# Patient Record
Sex: Male | Born: 1960 | Race: White | Hispanic: No | Marital: Married | Smoking: Former smoker
Health system: Southern US, Community
[De-identification: ages and names within clinical notes are randomized; demographics above are authoritative.]

## PROBLEM LIST (undated history)

## (undated) DIAGNOSIS — E291 Testicular hypofunction: Secondary | ICD-10-CM

## (undated) DIAGNOSIS — M797 Fibromyalgia: Secondary | ICD-10-CM

## (undated) DIAGNOSIS — M75102 Unspecified rotator cuff tear or rupture of left shoulder, not specified as traumatic: Secondary | ICD-10-CM

## (undated) DIAGNOSIS — Z9889 Other specified postprocedural states: Secondary | ICD-10-CM

## (undated) DIAGNOSIS — G4730 Sleep apnea, unspecified: Secondary | ICD-10-CM

## (undated) DIAGNOSIS — M2669 Other specified disorders of temporomandibular joint: Secondary | ICD-10-CM

## (undated) DIAGNOSIS — R112 Nausea with vomiting, unspecified: Secondary | ICD-10-CM

## (undated) DIAGNOSIS — E559 Vitamin D deficiency, unspecified: Secondary | ICD-10-CM

## (undated) DIAGNOSIS — G894 Chronic pain syndrome: Secondary | ICD-10-CM

## (undated) DIAGNOSIS — R519 Headache, unspecified: Secondary | ICD-10-CM

## (undated) DIAGNOSIS — I739 Peripheral vascular disease, unspecified: Secondary | ICD-10-CM

## (undated) DIAGNOSIS — F419 Anxiety disorder, unspecified: Secondary | ICD-10-CM

## (undated) DIAGNOSIS — F329 Major depressive disorder, single episode, unspecified: Secondary | ICD-10-CM

## (undated) DIAGNOSIS — M1990 Unspecified osteoarthritis, unspecified site: Secondary | ICD-10-CM

## (undated) DIAGNOSIS — B001 Herpesviral vesicular dermatitis: Secondary | ICD-10-CM

## (undated) DIAGNOSIS — K219 Gastro-esophageal reflux disease without esophagitis: Secondary | ICD-10-CM

## (undated) DIAGNOSIS — F32A Depression, unspecified: Secondary | ICD-10-CM

## (undated) DIAGNOSIS — M26629 Arthralgia of temporomandibular joint, unspecified side: Secondary | ICD-10-CM

## (undated) HISTORY — DX: Other specified disorders of temporomandibular joint: M26.69

## (undated) HISTORY — DX: Anxiety disorder, unspecified: F41.9

## (undated) HISTORY — DX: Major depressive disorder, single episode, unspecified: F32.9

## (undated) HISTORY — PX: COLONOSCOPY: SHX174

## (undated) HISTORY — DX: Fibromyalgia: M79.7

## (undated) HISTORY — DX: Depression, unspecified: F32.A

## (undated) HISTORY — DX: Chronic pain syndrome: G89.4

## (undated) HISTORY — DX: Testicular hypofunction: E29.1

## (undated) HISTORY — DX: Vitamin D deficiency, unspecified: E55.9

## (undated) HISTORY — PX: ROTATOR CUFF REPAIR: SHX139

## (undated) HISTORY — PX: VASECTOMY: SHX75

## (undated) HISTORY — DX: Herpesviral vesicular dermatitis: B00.1

---

## 1994-01-14 HISTORY — PX: SHOULDER ARTHROSCOPY: SHX128

## 1994-01-14 HISTORY — PX: ELBOW ARTHROPLASTY: SHX928

## 1995-01-15 HISTORY — PX: ORIF WRIST FRACTURE: SHX2133

## 1995-01-15 HISTORY — PX: HARDWARE REMOVAL: SHX979

## 2000-10-24 ENCOUNTER — Ambulatory Visit (HOSPITAL_COMMUNITY): Admission: RE | Admit: 2000-10-24 | Discharge: 2000-10-24 | Payer: Self-pay | Admitting: Otolaryngology

## 2000-10-24 ENCOUNTER — Encounter: Payer: Self-pay | Admitting: Otolaryngology

## 2010-10-03 DIAGNOSIS — M2550 Pain in unspecified joint: Secondary | ICD-10-CM | POA: Insufficient documentation

## 2010-11-08 ENCOUNTER — Ambulatory Visit: Payer: 59 | Attending: Psychiatry

## 2010-11-08 DIAGNOSIS — IMO0001 Reserved for inherently not codable concepts without codable children: Secondary | ICD-10-CM | POA: Insufficient documentation

## 2010-11-08 DIAGNOSIS — G8929 Other chronic pain: Secondary | ICD-10-CM | POA: Insufficient documentation

## 2012-03-05 ENCOUNTER — Other Ambulatory Visit: Payer: Self-pay | Admitting: Physical Medicine and Rehabilitation

## 2012-03-05 ENCOUNTER — Ambulatory Visit
Admission: RE | Admit: 2012-03-05 | Discharge: 2012-03-05 | Disposition: A | Discharge status: Home / Self Care | Payer: 59 | Source: Ambulatory Visit | Attending: Physical Medicine and Rehabilitation | Admitting: Physical Medicine and Rehabilitation

## 2012-03-05 DIAGNOSIS — M1990 Unspecified osteoarthritis, unspecified site: Secondary | ICD-10-CM

## 2012-03-05 DIAGNOSIS — M545 Low back pain, unspecified: Secondary | ICD-10-CM

## 2012-08-10 DIAGNOSIS — M1990 Unspecified osteoarthritis, unspecified site: Secondary | ICD-10-CM | POA: Insufficient documentation

## 2012-10-02 ENCOUNTER — Encounter (HOSPITAL_BASED_OUTPATIENT_CLINIC_OR_DEPARTMENT_OTHER): Payer: Self-pay | Admitting: *Deleted

## 2012-10-02 NOTE — Progress Notes (Signed)
No labs needed-had a sleep study-said no dr called him to use a cpap-will bring copy-told to bring all meds and overnight bag in case of need to stay

## 2012-10-07 ENCOUNTER — Encounter (HOSPITAL_BASED_OUTPATIENT_CLINIC_OR_DEPARTMENT_OTHER): Payer: Self-pay | Admitting: *Deleted

## 2012-10-09 ENCOUNTER — Encounter (HOSPITAL_BASED_OUTPATIENT_CLINIC_OR_DEPARTMENT_OTHER): Payer: Self-pay | Admitting: Anesthesiology

## 2012-10-09 ENCOUNTER — Ambulatory Visit (HOSPITAL_BASED_OUTPATIENT_CLINIC_OR_DEPARTMENT_OTHER)
Admission: RE | Admit: 2012-10-09 | Discharge: 2012-10-09 | Disposition: A | Discharge status: Home / Self Care | Payer: 59 | Source: Ambulatory Visit | Attending: Orthopedic Surgery | Admitting: Orthopedic Surgery

## 2012-10-09 ENCOUNTER — Ambulatory Visit (HOSPITAL_BASED_OUTPATIENT_CLINIC_OR_DEPARTMENT_OTHER): Payer: 59 | Admitting: Anesthesiology

## 2012-10-09 ENCOUNTER — Encounter (HOSPITAL_BASED_OUTPATIENT_CLINIC_OR_DEPARTMENT_OTHER)
Admission: RE | Disposition: A | Discharge status: Home / Self Care | Payer: Self-pay | Source: Ambulatory Visit | Attending: Orthopedic Surgery

## 2012-10-09 DIAGNOSIS — M75102 Unspecified rotator cuff tear or rupture of left shoulder, not specified as traumatic: Secondary | ICD-10-CM | POA: Diagnosis present

## 2012-10-09 DIAGNOSIS — K219 Gastro-esophageal reflux disease without esophagitis: Secondary | ICD-10-CM | POA: Insufficient documentation

## 2012-10-09 DIAGNOSIS — M719 Bursopathy, unspecified: Secondary | ICD-10-CM | POA: Insufficient documentation

## 2012-10-09 DIAGNOSIS — M19019 Primary osteoarthritis, unspecified shoulder: Secondary | ICD-10-CM | POA: Insufficient documentation

## 2012-10-09 DIAGNOSIS — M67919 Unspecified disorder of synovium and tendon, unspecified shoulder: Secondary | ICD-10-CM | POA: Insufficient documentation

## 2012-10-09 DIAGNOSIS — M25819 Other specified joint disorders, unspecified shoulder: Secondary | ICD-10-CM | POA: Insufficient documentation

## 2012-10-09 HISTORY — DX: Nausea with vomiting, unspecified: R11.2

## 2012-10-09 HISTORY — DX: Other specified postprocedural states: Z98.890

## 2012-10-09 HISTORY — DX: Unspecified rotator cuff tear or rupture of left shoulder, not specified as traumatic: M75.102

## 2012-10-09 HISTORY — DX: Gastro-esophageal reflux disease without esophagitis: K21.9

## 2012-10-09 HISTORY — PX: SHOULDER ARTHROSCOPY WITH ROTATOR CUFF REPAIR AND SUBACROMIAL DECOMPRESSION: SHX5686

## 2012-10-09 HISTORY — DX: Arthralgia of temporomandibular joint, unspecified side: M26.629

## 2012-10-09 HISTORY — DX: Sleep apnea, unspecified: G47.30

## 2012-10-09 HISTORY — DX: Unspecified osteoarthritis, unspecified site: M19.90

## 2012-10-09 SURGERY — SHOULDER ARTHROSCOPY WITH ROTATOR CUFF REPAIR AND SUBACROMIAL DECOMPRESSION
Anesthesia: General | Site: Shoulder | Laterality: Left | Wound class: Clean

## 2012-10-09 MED ORDER — LACTATED RINGERS IV SOLN
INTRAVENOUS | Status: DC
  Administered 2012-10-09: 20 mL/h via INTRAVENOUS
  Administered 2012-10-09 (×2): via INTRAVENOUS

## 2012-10-09 MED ORDER — DEXAMETHASONE SODIUM PHOSPHATE 4 MG/ML IJ SOLN
INTRAMUSCULAR | Status: DC | PRN
  Administered 2012-10-09: 10 mg via INTRAVENOUS

## 2012-10-09 MED ORDER — SUCCINYLCHOLINE CHLORIDE 20 MG/ML IJ SOLN
INTRAMUSCULAR | Status: DC | PRN
  Administered 2012-10-09: 120 mg via INTRAVENOUS

## 2012-10-09 MED ORDER — ONDANSETRON HCL 4 MG/2ML IJ SOLN: 4.0000 mg | Freq: Once | INTRAMUSCULAR | Status: DC | PRN

## 2012-10-09 MED ORDER — METHOCARBAMOL 500 MG PO TABS: 500.0000 mg | ORAL_TABLET | Freq: Four times a day (QID) | ORAL | Status: DC

## 2012-10-09 MED ORDER — METOCLOPRAMIDE HCL 5 MG/ML IJ SOLN
10.0000 mg | Freq: Once | INTRAMUSCULAR | Status: AC
  Administered 2012-10-09: 10 mg via INTRAVENOUS

## 2012-10-09 MED ORDER — PROMETHAZINE HCL 25 MG PO TABS: 25.0000 mg | ORAL_TABLET | Freq: Four times a day (QID) | ORAL | Status: DC | PRN

## 2012-10-09 MED ORDER — PROMETHAZINE HCL 25 MG/ML IJ SOLN
6.2500 mg | Freq: Once | INTRAMUSCULAR | Status: AC | PRN
  Administered 2012-10-09: 6.25 mg via INTRAVENOUS

## 2012-10-09 MED ORDER — OXYCODONE-ACETAMINOPHEN 10-325 MG PO TABS
1.0000 | ORAL_TABLET | Freq: Four times a day (QID) | ORAL | Status: DC | PRN
Start: 2012-10-09 — End: 2013-05-05

## 2012-10-09 MED ORDER — PROPOFOL 10 MG/ML IV BOLUS
INTRAVENOUS | Status: DC | PRN
  Administered 2012-10-09: 170 mg via INTRAVENOUS

## 2012-10-09 MED ORDER — SODIUM CHLORIDE 0.9 % IR SOLN
Status: DC | PRN
  Administered 2012-10-09: 12000 mL

## 2012-10-09 MED ORDER — KETOROLAC TROMETHAMINE 30 MG/ML IJ SOLN: 15.0000 mg | Freq: Once | INTRAMUSCULAR | Status: DC | PRN

## 2012-10-09 MED ORDER — HYDROMORPHONE HCL PF 1 MG/ML IJ SOLN: 0.2500 mg | INTRAMUSCULAR | Status: DC | PRN

## 2012-10-09 MED ORDER — GLYCOPYRROLATE 0.2 MG/ML IJ SOLN
INTRAMUSCULAR | Status: DC | PRN
  Administered 2012-10-09: 0.2 mg via INTRAVENOUS

## 2012-10-09 MED ORDER — FENTANYL CITRATE 0.05 MG/ML IJ SOLN
50.0000 ug | INTRAMUSCULAR | Status: DC | PRN
  Administered 2012-10-09: 100 ug via INTRAVENOUS

## 2012-10-09 MED ORDER — FENTANYL CITRATE 0.05 MG/ML IJ SOLN
INTRAMUSCULAR | Status: DC | PRN
  Administered 2012-10-09: 100 ug via INTRAVENOUS

## 2012-10-09 MED ORDER — OXYCODONE HCL 5 MG/5ML PO SOLN: 5.0000 mg | Freq: Once | ORAL | Status: DC | PRN

## 2012-10-09 MED ORDER — OXYCODONE HCL 5 MG PO TABS: 5.0000 mg | ORAL_TABLET | Freq: Once | ORAL | Status: DC | PRN

## 2012-10-09 MED ORDER — SCOPOLAMINE 1 MG/3DAYS TD PT72
1.0000 | MEDICATED_PATCH | TRANSDERMAL | Status: DC
  Administered 2012-10-09: 1.5 mg via TRANSDERMAL

## 2012-10-09 MED ORDER — MIDAZOLAM HCL 2 MG/2ML IJ SOLN
1.0000 mg | INTRAMUSCULAR | Status: DC | PRN
  Administered 2012-10-09: 2 mg via INTRAVENOUS

## 2012-10-09 MED ORDER — CEFAZOLIN SODIUM-DEXTROSE 2-3 GM-% IV SOLR
2.0000 g | Freq: Once | INTRAVENOUS | Status: AC
  Administered 2012-10-09: 2 g via INTRAVENOUS

## 2012-10-09 MED ORDER — ONDANSETRON HCL 4 MG/2ML IJ SOLN
INTRAMUSCULAR | Status: DC | PRN
  Administered 2012-10-09: 4 mg via INTRAVENOUS

## 2012-10-09 SURGICAL SUPPLY — 68 items
ANCH SUT SWLK 19.1 CLS EYLT TL (Anchor) ×1 IMPLANT
ANCHOR SUT BIO SW 4.75 W/FIB (Anchor) ×1 IMPLANT
APL SKNCLS STERI-STRIP NONHPOA (GAUZE/BANDAGES/DRESSINGS) ×1
BENZOIN TINCTURE PRP APPL 2/3 (GAUZE/BANDAGES/DRESSINGS) ×2 IMPLANT
BLADE CUTTER GATOR 3.5 (BLADE) ×2 IMPLANT
BLADE GREAT WHITE 4.2 (BLADE) IMPLANT
BLADE SURG 15 STRL LF DISP TIS (BLADE) IMPLANT
BLADE SURG 15 STRL SS (BLADE)
BUR OVAL 4.0 (BURR) IMPLANT
BUR OVAL 6.0 (BURR) ×1 IMPLANT
CANISTER OMNI JUG 16 LITER (MISCELLANEOUS) ×2 IMPLANT
CANNULA 5.75X71 LONG (CANNULA) ×2 IMPLANT
CANNULA TWIST IN 8.25X7CM (CANNULA) ×2 IMPLANT
CLOTH BEACON ORANGE TIMEOUT ST (SAFETY) ×2 IMPLANT
DECANTER SPIKE VIAL GLASS SM (MISCELLANEOUS) IMPLANT
DRAPE INCISE IOBAN 66X45 STRL (DRAPES) ×2 IMPLANT
DRAPE SHOULDER BEACH CHAIR (DRAPES) ×2 IMPLANT
DRAPE U 20/CS (DRAPES) ×2 IMPLANT
DRAPE U-SHAPE 47X51 STRL (DRAPES) ×2 IMPLANT
DRSG PAD ABDOMINAL 8X10 ST (GAUZE/BANDAGES/DRESSINGS) ×2 IMPLANT
DURAPREP 26ML APPLICATOR (WOUND CARE) ×2 IMPLANT
ELECT REM PT RETURN 9FT ADLT (ELECTROSURGICAL) ×2
ELECTRODE REM PT RTRN 9FT ADLT (ELECTROSURGICAL) ×1 IMPLANT
FIBERSTICK 2 (SUTURE) IMPLANT
GLOVE BIO SURGEON STRL SZ 6.5 (GLOVE) ×1 IMPLANT
GLOVE BIO SURGEON STRL SZ8 (GLOVE) ×2 IMPLANT
GLOVE BIOGEL PI IND STRL 7.0 (GLOVE) IMPLANT
GLOVE BIOGEL PI IND STRL 8 (GLOVE) ×2 IMPLANT
GLOVE BIOGEL PI INDICATOR 7.0 (GLOVE) ×1
GLOVE BIOGEL PI INDICATOR 8 (GLOVE) ×2
GLOVE ORTHO TXT STRL SZ7.5 (GLOVE) ×2 IMPLANT
GOWN BRE IMP PREV XXLGXLNG (GOWN DISPOSABLE) ×4 IMPLANT
GOWN PREVENTION PLUS XLARGE (GOWN DISPOSABLE) ×2 IMPLANT
IMMOBILIZER SHOULDER XLGE (ORTHOPEDIC SUPPLIES) IMPLANT
KIT SHOULDER TRACTION (DRAPES) ×2 IMPLANT
LASSO SUT 90 DEGREE (SUTURE) IMPLANT
NDL SCORPION MULTI FIRE (NEEDLE) IMPLANT
NEEDLE SCORPION MULTI FIRE (NEEDLE) ×2 IMPLANT
PACK ARTHROSCOPY DSU (CUSTOM PROCEDURE TRAY) ×2 IMPLANT
PACK BASIN DAY SURGERY FS (CUSTOM PROCEDURE TRAY) ×2 IMPLANT
SET ARTHROSCOPY TUBING (MISCELLANEOUS) ×2
SET ARTHROSCOPY TUBING LN (MISCELLANEOUS) ×1 IMPLANT
SHEET MEDIUM DRAPE 40X70 STRL (DRAPES) ×2 IMPLANT
SLEEVE SCD COMPRESS KNEE MED (MISCELLANEOUS) ×2 IMPLANT
SLING ARM FOAM STRAP LRG (SOFTGOODS) IMPLANT
SLING ARM FOAM STRAP MED (SOFTGOODS) IMPLANT
SLING ARM FOAM STRAP XLG (SOFTGOODS) IMPLANT
SLING ARM IMMOBILIZER LRG (SOFTGOODS) IMPLANT
SLING ARM IMMOBILIZER MED (SOFTGOODS) IMPLANT
SPONGE GAUZE 4X4 12PLY (GAUZE/BANDAGES/DRESSINGS) ×2 IMPLANT
STRIP CLOSURE SKIN 1/2X4 (GAUZE/BANDAGES/DRESSINGS) ×2 IMPLANT
SUT FIBERWIRE #2 38 T-5 BLUE (SUTURE)
SUT LASSO 45 DEGREE (SUTURE) IMPLANT
SUT LASSO 45 DEGREE LEFT (SUTURE) IMPLANT
SUT LASSO 45D RIGHT (SUTURE) IMPLANT
SUT MNCRL AB 4-0 PS2 18 (SUTURE) ×1 IMPLANT
SUT PDS AB 1 CT  36 (SUTURE)
SUT PDS AB 1 CT 36 (SUTURE) IMPLANT
SUT TIGER TAPE 7 IN WHITE (SUTURE) IMPLANT
SUT VIC AB 3-0 SH 27 (SUTURE)
SUT VIC AB 3-0 SH 27X BRD (SUTURE) IMPLANT
SUTURE FIBERWR #2 38 T-5 BLUE (SUTURE) IMPLANT
TAPE FIBER 2MM 7IN #2 BLUE (SUTURE) ×2 IMPLANT
TOWEL OR 17X24 6PK STRL BLUE (TOWEL DISPOSABLE) ×2 IMPLANT
TOWEL OR NON WOVEN STRL DISP B (DISPOSABLE) ×2 IMPLANT
TUBE CONNECTING 20X1/4 (TUBING) IMPLANT
WAND STAR VAC 90 (SURGICAL WAND) ×2 IMPLANT
WATER STERILE IRR 1000ML POUR (IV SOLUTION) ×2 IMPLANT

## 2012-10-09 NOTE — Progress Notes (Signed)
Assisted Dr. Joslin with left, ultrasound guided, interscalene  block. Side rails up, monitors on throughout procedure. See vital signs in flow sheet. Tolerated Procedure well. 

## 2012-10-09 NOTE — Anesthesia Procedure Notes (Addendum)
Anesthesia Regional Block:  Interscalene brachial plexus block  Pre-Anesthetic Checklist: ,, timeout performed, Correct Patient, Correct Site, Correct Laterality, Correct Procedure, Correct Position, site marked, Risks and benefits discussed,  Surgical consent,  Pre-op evaluation,  At surgeon's request and post-op pain management  Laterality: Left  Prep: chloraprep       Needles:  Injection technique: Single-shot  Needle Type: Echogenic Stimulator Needle      Needle Gauge: 22 and 22 G    Additional Needles:  Procedures: ultrasound guided (picture in chart) Interscalene brachial plexus block Narrative:  Start time: 10/09/2012 9:15 AM End time: 10/09/2012 9:20 AM Injection made incrementally with aspirations every 5 mL.  Performed by: Personally   Additional Notes: 20 cc 0.5% Bupivacaine with 1:200 Epi injected easily   Procedure Name: Intubation Date/Time: 10/09/2012 9:30 AM Performed by: Gar Gibbon Pre-anesthesia Checklist: Patient identified, Emergency Drugs available, Suction available and Patient being monitored Patient Re-evaluated:Patient Re-evaluated prior to inductionOxygen Delivery Method: Circle System Utilized Preoxygenation: Pre-oxygenation with 100% oxygen Intubation Type: IV induction Ventilation: Mask ventilation without difficulty Laryngoscope Size: Miller Grade View: Grade III Tube type: Oral Number of attempts: 1 Airway Equipment and Method: oral airway,  Patient positioned with wedge pillow,  Rigid stylet,  Video-laryngoscopy and stylet Placement Confirmation: ETT inserted through vocal cords under direct vision,  positive ETCO2 and breath sounds checked- equal and bilateral Secured at: 22 cm Tube secured with: Tape Dental Injury: Bloody posterior oropharynx  Difficulty Due To: Difficulty was anticipated and Difficult Airway- due to anterior larynx Future Recommendations: Recommend- induction with short-acting agent, and alternative techniques  readily available Comments: Attempt x 1 with miller #3, grade 3 view.  Glidescope with rigid stylet. EBBS checked per Dr Noreene Larsson pre and post positioning in lateral position

## 2012-10-09 NOTE — Transfer of Care (Signed)
Immediate Anesthesia Transfer of Care Note  Patient: Benjamin Warren  Procedure(s) Performed: Procedure(s): LEFT SHOULDER ARTHROSCOPY WITH SUBACROMIAL DECOMPRESSION, DISTAL CLAVICLE EXCISION DEBRIDEMENT AND ROTATOR CUFF REPAIR (Left)  Patient Location: PACU  Anesthesia Type:GA combined with regional for post-op pain  Level of Consciousness: awake, sedated and patient cooperative  Airway & Oxygen Therapy: Patient Spontanous Breathing and Patient connected to face mask oxygen  Post-op Assessment: Report given to PACU RN and Post -op Vital signs reviewed and stable  Post vital signs: Reviewed and stable  Complications: No apparent anesthesia complications

## 2012-10-09 NOTE — H&P (Signed)
PREOPERATIVE H&P  Chief Complaint: LEFT SHOULDER pain  HPI: Benjamin Warren is a 52 y.o. male who presents for preoperative history and physical with a diagnosis of LEFT SHOULDER IMPINGEMENT SYNDROME, possible RUPTURE F ROTATOR CUFF. Symptoms are rated as moderate to severe, and have been worsening.  This is significantly impairing activities of daily living.  He has elected for surgical management. He has failed injections, exercises, and complains of pain that is laterally, with difficulty lifting and sleeping. He also has a diagnosis of chronic pain syndrome and fibromyalgia.  Past Medical History  Diagnosis Date  . Arthritis   . GERD (gastroesophageal reflux disease)   . Wears glasses   . PONV (postoperative nausea and vomiting)   . TMJ syndrome   . Sleep apnea     had test-no dr told him he needed cpap   Past Surgical History  Procedure Laterality Date  . Orif wrist fracture  1997    right  . Hardware removal  1997    rt wrist  . Shoulder arthroscopy  1996    right  . Elbow arthroplasty  1996    right   History   Social History  . Marital Status: Married    Spouse Name: N/A    Number of Children: N/A  . Years of Education: N/A   Social History Main Topics  . Smoking status: Former Smoker    Quit date: 10/03/1982  . Smokeless tobacco: None  . Alcohol Use: Yes     Comment: occ  . Drug Use: None  . Sexual Activity: None   Other Topics Concern  . None   Social History Narrative  . None   History reviewed. No pertinent family history. No Known Allergies Prior to Admission medications   Medication Sig Start Date End Date Taking? Authorizing Provider  carisoprodol (SOMA) 350 MG tablet Take 350 mg by mouth at bedtime as needed for muscle spasms.   Yes Historical Provider, MD  diazepam (VALIUM) 10 MG tablet Take 10 mg by mouth every 8 (eight) hours as needed for anxiety.   Yes Historical Provider, MD  divalproex (DEPAKOTE) 500 MG DR tablet Take 500 mg by mouth at  bedtime.   Yes Historical Provider, MD  omeprazole (PRILOSEC) 40 MG capsule Take 40 mg by mouth daily.   Yes Historical Provider, MD     Positive ROS: All other systems have been reviewed and were otherwise negative with the exception of those mentioned in the HPI and as above.  Physical Exam: General: Alert, no acute distress Cardiovascular: No pedal edema Respiratory: No cyanosis, no use of accessory musculature GI: No organomegaly, abdomen is soft and non-tender Skin: No lesions in the area of chief complaint Neurologic: Sensation intact distally Psychiatric: Patient is competent for consent with normal mood and affect Lymphatic: No axillary or cervical lymphadenopathy  MUSCULOSKELETAL: Left shoulder active forward flexion is 0-120. External rotation is to 10. He does have some pain over the a.c. joint.  Assessment: LEFT SHOULDER IMPINGEMENT SYNDROME, possible RUPTURE F ROTATOR CUFF, a.c. joint arthrosis  Plan: Plan for Procedure(s): LEFT SHOULDER ARTHROSCOPY WITH SUBACROMIAL DECOMPRESSION, DISTAL CLAVICLE EXCISION AND ROTATOR CUFF REPAIR depending on operative findings  The risks benefits and alternatives were discussed with the patient including but not limited to the risks of nonoperative treatment, versus surgical intervention including infection, bleeding, nerve injury,  blood clots, cardiopulmonary complications, morbidity, mortality, among others, and they were willing to proceed.   Eulas Post, MD Cell [phone]  10/09/2012 8:57 AM

## 2012-10-09 NOTE — Op Note (Signed)
10/09/2012  11:13 AM  PATIENT:  Benjamin Warren    PRE-OPERATIVE DIAGNOSIS:  Left shoulder high-grade rotator cuff tear of the supraspinatus, impingement syndrome, a.c. joint arthrosis, possible labral fraying  POST-OPERATIVE DIAGNOSIS:  Left shoulder 90% supraspinatus tear, impingement syndrome, a.c. joint arthrosis, anterior-superior labral fraying  PROCEDURE:  LEFT SHOULDER ARTHROSCOPY WITH SUBACROMIAL DECOMPRESSION, DISTAL CLAVICLE EXCISION DEBRIDEMENT AND ROTATOR CUFF REPAIR, and debridement of labrum  SURGEON:  Eulas Post, MD  PHYSICIAN ASSISTANT: Janace Litten, OPA-C, present and scrubbed throughout the case, critical for completion in a timely fashion, and for retraction, instrumentation, and closure.  ANESTHESIA:   General  PREOPERATIVE INDICATIONS:  [name] is a  52 y.o. male with a diagnosis of LEFT SHOULDER IMPINGEMENT SYNDROME, COMPLETE RUPTURE OF ROTATOR CUFF who failed conservative measures and elected for surgical management.    The risks benefits and alternatives were discussed with the patient preoperatively including but not limited to the risks of infection, bleeding, nerve injury, cardiopulmonary complications, the need for revision surgery, among others, and the patient was willing to proceed.  OPERATIVE IMPLANTS: Arthrex bio composite 4.75 mm swivel lock anchor with a single inverted fiber tape suture for the rotator cuff  OPERATIVE FINDINGS: The glenohumeral articular surface was normal. The anterior superior labrum was frayed. The subscapularis and middle glenohumeral ligament was normal. The biceps tendon had a little bit of fraying, but this was less than 10%. The posterior labrum was intact. The shoulder had full motion during examination under anesthesia. The rotator cuff cable was actually intact, and the capsular portion of the supraspinatus was intact, however from the bursal side there was complete disruption of the tendon. It was rather remarkable,  such that it did appear that the capsule of the shoulder was intact while the superior tendon was completely disrupted.  The tendon quality was fair. There was a sizable subacromial spur with CA ligament fraying and also hypertrophy of the distal clavicle that was fairly substantial.  OPERATIVE PROCEDURE: The patient is brought to the operating room and placed in the supine position. General anesthesia was administered. IV antibiotics were given. The left upper [place] was examined and found to have full motion. It was then prepped and draped in usual sterile fashion. Time out was performed. Diagnostic arthroscopy was carried out the above-named findings. I used the arthroscopic shaver to debride the anterior superior labrum.  I then went to the subacromial space, and found substantial rotator cuff pathology. The bone tendon edges were debrided, and the CA ligament was released. There was a sizable subacromial spur which I removed with a bur. I performed a light tubercleplasty, preparing a bed for reimplantation of the supraspinatus tendon. I also removed the remaining articular surface of the cuff tissue, completing the tear to be full-thickness, and then used in inverted fiber tape placed with a scorpion suture passer. My first pass actually went around the biceps tendon, and I had to remove this, and then replaced it making sure that the tape was passed over-the-top of the tendon, and not encircling it.  After both tails had been passed, brought these through a superior cannula, and then placed a punch into the lateral margin of the tuberosity, and repaired the tendon back to bone. Excellent fixation and tension was achieved.  I then removed the hypertrophic capsular tissue around the a.c. joint, and there was excellent fairly substantial degenerative change noted, along with what appeared to be a small area of discoid meniscus left. This was removed, and  I performed a distal clavicle resection  confirming satisfactory resection for multiple views. Visualization of the distal clavicle was moderately challenging, and he was fairly medial and configuration.  Nonetheless I did achieve satisfactory resection, removing probably at least 12 millimeters of bone.  The instruments were removed, and the portals closed with Monocryl followed by Steri-Strips and sterile gauze. He was awakened and returned to the PACU in stable and satisfactory condition. There were no complications.

## 2012-10-09 NOTE — Anesthesia Preprocedure Evaluation (Addendum)
Anesthesia Evaluation  Patient identified by MRN, date of birth, ID band Patient awake    Reviewed: Allergy & Precautions, H&P , NPO status , Patient's Chart, lab work & pertinent test results  History of Anesthesia Complications (+) PONV  Airway Mallampati: II TM Distance: >3 FB Neck ROM: Full    Dental  (+) Teeth Intact and Dental Advisory Given   Pulmonary former smoker (quit '84),  breath sounds clear to auscultation        Cardiovascular Rhythm:Regular Rate:Normal     Neuro/Psych    GI/Hepatic GERD-  Medicated and Controlled,  Endo/Other    Renal/GU      Musculoskeletal   Abdominal   Peds  Hematology   Anesthesia Other Findings   Reproductive/Obstetrics                          Anesthesia Physical Anesthesia Plan  ASA: II  Anesthesia Plan: General   Post-op Pain Management:    Induction: Intravenous  Airway Management Planned: Oral ETT  Additional Equipment:   Intra-op Plan:   Post-operative Plan: Extubation in OR  Informed Consent: I have reviewed the patients History and Physical, chart, labs and discussed the procedure including the risks, benefits and alternatives for the proposed anesthesia with the patient or authorized representative who has indicated his/her understanding and acceptance.   Dental advisory given  Plan Discussed with:   Anesthesia Plan Comments: (Impingement L. Shoulder H/O PONV Sleep apnea RDI 23 not on CPAP GERD  Plan GA with interscalene block and oral ETT  Kipp Brood, MD)        Anesthesia Quick Evaluation

## 2012-10-09 NOTE — Anesthesia Postprocedure Evaluation (Signed)
  Anesthesia Post-op Note  Patient: Benjamin Warren  Procedure(s) Performed: Procedure(s): LEFT SHOULDER ARTHROSCOPY WITH SUBACROMIAL DECOMPRESSION, DISTAL CLAVICLE EXCISION DEBRIDEMENT AND ROTATOR CUFF REPAIR (Left)  Patient Location: PACU  Anesthesia Type:GA combined with regional for post-op pain  Level of Consciousness: awake, alert , oriented and patient cooperative  Airway and Oxygen Therapy: Patient Spontanous Breathing  Post-op Pain: none  Post-op Assessment: Post-op Vital signs reviewed, Patient's Cardiovascular Status Stable, Respiratory Function Stable, Patent Airway and Pain level controlled, nausea much improved  Post-op Vital Signs: Reviewed and stable  Complications: No apparent anesthesia complications

## 2012-10-13 ENCOUNTER — Encounter (HOSPITAL_BASED_OUTPATIENT_CLINIC_OR_DEPARTMENT_OTHER): Payer: Self-pay | Admitting: Orthopedic Surgery

## 2012-11-24 ENCOUNTER — Emergency Department (HOSPITAL_BASED_OUTPATIENT_CLINIC_OR_DEPARTMENT_OTHER)
Admission: EM | Admit: 2012-11-24 | Discharge: 2012-11-25 | Disposition: A | Discharge status: Home / Self Care | Payer: 59 | Attending: Emergency Medicine | Admitting: Emergency Medicine

## 2012-11-24 ENCOUNTER — Encounter (HOSPITAL_BASED_OUTPATIENT_CLINIC_OR_DEPARTMENT_OTHER): Payer: Self-pay | Admitting: Emergency Medicine

## 2012-11-24 DIAGNOSIS — Z79899 Other long term (current) drug therapy: Secondary | ICD-10-CM | POA: Insufficient documentation

## 2012-11-24 DIAGNOSIS — M129 Arthropathy, unspecified: Secondary | ICD-10-CM | POA: Insufficient documentation

## 2012-11-24 DIAGNOSIS — K219 Gastro-esophageal reflux disease without esophagitis: Secondary | ICD-10-CM | POA: Insufficient documentation

## 2012-11-24 DIAGNOSIS — M545 Low back pain, unspecified: Secondary | ICD-10-CM

## 2012-11-24 DIAGNOSIS — Z87891 Personal history of nicotine dependence: Secondary | ICD-10-CM | POA: Insufficient documentation

## 2012-11-24 LAB — URINALYSIS, ROUTINE W REFLEX MICROSCOPIC
Bilirubin Urine: NEGATIVE
Glucose, UA: NEGATIVE mg/dL
Hgb urine dipstick: NEGATIVE
Ketones, ur: NEGATIVE mg/dL
Protein, ur: NEGATIVE mg/dL
Specific Gravity, Urine: 1.023 (ref 1.005–1.030)

## 2012-11-24 NOTE — ED Notes (Addendum)
Pt reports lower back pain along spine that radiates out beyond bilateral flanks.  Pt. Reports that he went to orthopedic today and had an xray performed on his back.  Sts that the MD wanted to have MRI performed on back this Saturday. Reports taking 3 valium earlier with very minimal relief.  Denies dysuria or any urinary symptoms. Reports deep breaths make the pain worse.

## 2012-11-24 NOTE — ED Notes (Signed)
Bilateral flank pain and lower back pain. Hx of same pain in the past year relieved by Flexeril. He was seen by his ortho today and had negative exam but they scheduled him for an MRI in 4 days.

## 2012-11-25 MED ORDER — KETOROLAC TROMETHAMINE 60 MG/2ML IM SOLN
60.0000 mg | Freq: Once | INTRAMUSCULAR | Status: AC
  Administered 2012-11-25: 60 mg via INTRAMUSCULAR
  Filled 2012-11-25: qty 2

## 2012-11-25 NOTE — ED Notes (Signed)
Family at bedside. 

## 2012-11-25 NOTE — ED Provider Notes (Signed)
CSN: [id number]     Arrival date & time 11/24/12  2248 History   First MD Initiated Contact with Patient 11/25/12 0050     Chief Complaint  Patient presents with  . Flank Pain   (Consider location/radiation/quality/duration/timing/severity/associated sxs/prior Treatment) HPI This is a 52 year old male with a three-day history of low back pain. The pain is located in his lower midline lumbar region, radiating around to both sides. It is moderate to severe. It is worse with movement or deep breathing. He has had similar pain in the past that was relieved with Flexeril. He took Flexeril without relief. He did take Valium prior to arrival with some improvement. He states he has a paradoxical reaction to narcotics, not getting pain relief but suffering nausea and vomiting. He is scheduled for an MRI in 3 days to evaluate his back. Plain films performed by his orthopedist yesterday were unremarkable.  Past Medical History  Diagnosis Date  . Arthritis   . GERD (gastroesophageal reflux disease)   . Wears glasses   . PONV (postoperative nausea and vomiting)   . TMJ syndrome   . Sleep apnea     had test-no dr told him he needed cpap  . Left rotator cuff tear 10/09/2012   Past Surgical History  Procedure Laterality Date  . Orif wrist fracture  1997    right  . Hardware removal  1997    rt wrist  . Shoulder arthroscopy  1996    right  . Elbow arthroplasty  1996    right  . Shoulder arthroscopy with rotator cuff repair and subacromial decompression Left 10/09/2012    Procedure: LEFT SHOULDER ARTHROSCOPY WITH SUBACROMIAL DECOMPRESSION, DISTAL CLAVICLE EXCISION DEBRIDEMENT AND ROTATOR CUFF REPAIR;  Surgeon: Eulas Post, MD;  Location: [facility];  Service: Orthopedics;  Laterality: Left;   No family history on file. History  Substance Use Topics  . Smoking status: Former Smoker    Quit date: 10/03/1982  . Smokeless tobacco: Not on file  . Alcohol Use: Yes     Comment: occ     Review of Systems  All other systems reviewed and are negative.    Allergies  Review of patient's allergies indicates no known allergies.  Home Medications   Current Outpatient Rx  Name  Route  Sig  Dispense  Refill  . carisoprodol (SOMA) 350 MG tablet   Oral   Take 350 mg by mouth at bedtime as needed for muscle spasms.         . diazepam (VALIUM) 10 MG tablet   Oral   Take 10 mg by mouth every 8 (eight) hours as needed for anxiety.         . divalproex (DEPAKOTE) 500 MG DR tablet   Oral   Take 500 mg by mouth at bedtime.         . methocarbamol (ROBAXIN) 500 MG tablet   Oral   Take 1 tablet (500 mg total) by mouth 4 (four) times daily.   75 tablet   1   . omeprazole (PRILOSEC) 40 MG capsule   Oral   Take 40 mg by mouth daily.         Marland Kitchen oxyCODONE-acetaminophen (PERCOCET) 10-325 MG per tablet   Oral   Take 1-2 tablets by mouth every 6 (six) hours as needed for pain. MAXIMUM TOTAL ACETAMINOPHEN DOSE IS 4000 MG PER DAY   75 tablet   0   . promethazine (PHENERGAN) 25 MG tablet  Oral   Take 1 tablet (25 mg total) by mouth every 6 (six) hours as needed for nausea.   30 tablet   0    BP 126/75  Pulse 69  Temp(Src) 97.8 F (36.6 C) (Oral)  Resp 16  Ht 5\' 10"  (1.778 m)  Wt 215 lb (97.523 kg)  BMI 30.85 kg/m2  SpO2 97%  Physical Exam General: Well-developed, well-nourished male in no acute distress; appearance consistent with age of record HENT: normocephalic; atraumatic Eyes: Normal appearance Neck: supple Heart: regular rate and rhythm Lungs: clear to auscultation bilaterally Abdomen: soft; nondistended Back: Lower lumbar midline tenderness Extremities: Left upper extremity in sling status post surgery Neurologic: Awake but drowsy; motor function intact in all extremities and symmetric; no facial droop Skin: Warm and dry Psychiatric: Flat affect    ED Course  Procedures (including critical care time)  MDM  Nursing notes and vitals  signs, including pulse oximetry, reviewed.  Summary of this visit's results, reviewed by myself:  Labs:  Results for orders placed during the hospital encounter of 11/24/12 (from the past 24 hour(s))  URINALYSIS, ROUTINE W REFLEX MICROSCOPIC     Status: Abnormal   Collection Time    11/24/12 10:55 PM      Result Value Range   Color, Urine YELLOW  YELLOW   APPearance TURBID (*) CLEAR   Specific Gravity, Urine 1.023  1.005 - 1.030   pH 7.5  5.0 - 8.0   Glucose, UA NEGATIVE  NEGATIVE mg/dL   Hgb urine dipstick NEGATIVE  NEGATIVE   Bilirubin Urine NEGATIVE  NEGATIVE   Ketones, ur NEGATIVE  NEGATIVE mg/dL   Protein, ur NEGATIVE  NEGATIVE mg/dL   Urobilinogen, UA 0.2  0.0 - 1.0 mg/dL   Nitrite NEGATIVE  NEGATIVE   Leukocytes, UA NEGATIVE  NEGATIVE  URINE MICROSCOPIC-ADD ON     Status: None   Collection Time    11/24/12 10:55 PM      Result Value Range   Squamous Epithelial / LPF RARE  RARE   WBC, UA 0-2  <3 WBC/hpf   Urine-Other MUCOUS PRESENT     The patient declines narcotic medication but agrees to IM Toradol.    Hanley Seamen, MD 11/25/12 [phone]

## 2012-11-26 ENCOUNTER — Other Ambulatory Visit (HOSPITAL_COMMUNITY): Payer: Self-pay | Admitting: Sports Medicine

## 2012-11-26 DIAGNOSIS — M545 Low back pain, unspecified: Secondary | ICD-10-CM

## 2012-11-27 ENCOUNTER — Ambulatory Visit (HOSPITAL_COMMUNITY)
Admission: RE | Admit: 2012-11-27 | Discharge: 2012-11-27 | Disposition: A | Discharge status: Home / Self Care | Payer: 59 | Source: Ambulatory Visit | Attending: Sports Medicine | Admitting: Sports Medicine

## 2012-11-27 DIAGNOSIS — M545 Low back pain, unspecified: Secondary | ICD-10-CM

## 2013-05-05 ENCOUNTER — Encounter: Payer: Self-pay | Admitting: Internal Medicine

## 2013-05-05 ENCOUNTER — Ambulatory Visit (INDEPENDENT_AMBULATORY_CARE_PROVIDER_SITE_OTHER): Payer: 59 | Admitting: Internal Medicine

## 2013-05-05 ENCOUNTER — Other Ambulatory Visit (INDEPENDENT_AMBULATORY_CARE_PROVIDER_SITE_OTHER): Payer: 59

## 2013-05-05 VITALS — BP 110/70 | HR 70 | Temp 97.6°F | Ht 70.0 in | Wt 222.8 lb

## 2013-05-05 DIAGNOSIS — M797 Fibromyalgia: Secondary | ICD-10-CM

## 2013-05-05 DIAGNOSIS — F32A Depression, unspecified: Secondary | ICD-10-CM | POA: Insufficient documentation

## 2013-05-05 DIAGNOSIS — IMO0001 Reserved for inherently not codable concepts without codable children: Secondary | ICD-10-CM

## 2013-05-05 DIAGNOSIS — Z0000 Encounter for general adult medical examination without abnormal findings: Secondary | ICD-10-CM

## 2013-05-05 DIAGNOSIS — Z1211 Encounter for screening for malignant neoplasm of colon: Secondary | ICD-10-CM

## 2013-05-05 DIAGNOSIS — G894 Chronic pain syndrome: Secondary | ICD-10-CM | POA: Insufficient documentation

## 2013-05-05 DIAGNOSIS — F329 Major depressive disorder, single episode, unspecified: Secondary | ICD-10-CM | POA: Insufficient documentation

## 2013-05-05 DIAGNOSIS — K219 Gastro-esophageal reflux disease without esophagitis: Secondary | ICD-10-CM | POA: Insufficient documentation

## 2013-05-05 DIAGNOSIS — G4733 Obstructive sleep apnea (adult) (pediatric): Secondary | ICD-10-CM | POA: Insufficient documentation

## 2013-05-05 LAB — URINALYSIS, ROUTINE W REFLEX MICROSCOPIC
Bilirubin Urine: NEGATIVE
HGB URINE DIPSTICK: NEGATIVE
KETONES UR: NEGATIVE
Leukocytes, UA: NEGATIVE
Nitrite: NEGATIVE
RBC / HPF: NONE SEEN (ref 0–?)
Specific Gravity, Urine: >=1.03 — AB (ref 1.000–1.030)
Total Protein, Urine: NEGATIVE
URINE GLUCOSE: NEGATIVE
Urobilinogen, UA: 0.2 (ref 0.0–1.0)
pH: 6 (ref 5.0–8.0)

## 2013-05-05 LAB — CBC WITH DIFFERENTIAL/PLATELET
BASOS PCT: 0.6 % (ref 0.0–3.0)
Basophils Absolute: 0.1 10*3/uL (ref 0.0–0.1)
EOS PCT: 1.8 % (ref 0.0–5.0)
Eosinophils Absolute: 0.2 10*3/uL (ref 0.0–0.7)
HCT: 46.3 % (ref 39.0–52.0)
Hemoglobin: 15.8 g/dL (ref 13.0–17.0)
LYMPHS PCT: 33.7 % (ref 12.0–46.0)
Lymphs Abs: 3 10*3/uL (ref 0.7–4.0)
MCHC: 34.2 g/dL (ref 30.0–36.0)
MCV: 91.7 fl (ref 78.0–100.0)
MONO ABS: 0.6 10*3/uL (ref 0.1–1.0)
MONOS PCT: 6.8 % (ref 3.0–12.0)
NEUTROS PCT: 57.1 % (ref 43.0–77.0)
Neutro Abs: 5.1 10*3/uL (ref 1.4–7.7)
Platelets: 275 10*3/uL (ref 150.0–400.0)
RBC: 5.05 Mil/uL (ref 4.22–5.81)
RDW: 13.4 % (ref 11.5–14.6)
WBC: 9 10*3/uL (ref 4.5–10.5)

## 2013-05-05 LAB — BASIC METABOLIC PANEL
BUN: 17 mg/dL (ref 6–23)
CALCIUM: 9 mg/dL (ref 8.4–10.5)
CO2: 25 mEq/L (ref 19–32)
CREATININE: 1.1 mg/dL (ref 0.4–1.5)
Chloride: 106 mEq/L (ref 96–112)
GFR: 72.06 mL/min (ref >60.00)
Glucose, Bld: 112 mg/dL — ABNORMAL HIGH (ref 70–99)
Potassium: 4 mEq/L (ref 3.5–5.1)
Sodium: 138 mEq/L (ref 135–145)

## 2013-05-05 LAB — LIPID PANEL
CHOLESTEROL: 191 mg/dL (ref 0–200)
HDL: 24.1 mg/dL — AB (ref >39.00)
LDL CALC: 117 mg/dL — AB (ref 0–99)
TRIGLYCERIDES: 252 mg/dL — AB (ref 0.0–149.0)
Total CHOL/HDL Ratio: 8
VLDL: 50.4 mg/dL — ABNORMAL HIGH (ref 0.0–40.0)

## 2013-05-05 LAB — TSH: TSH: 1.23 u[IU]/mL (ref 0.35–5.50)

## 2013-05-05 LAB — HEPATIC FUNCTION PANEL
ALK PHOS: 68 U/L (ref 39–117)
ALT: 33 U/L (ref 0–53)
AST: 24 U/L (ref 0–37)
Albumin: 3.7 g/dL (ref 3.5–5.2)
BILIRUBIN DIRECT: 0.1 mg/dL (ref 0.0–0.3)
Total Bilirubin: 0.6 mg/dL (ref 0.3–1.2)
Total Protein: 6.8 g/dL (ref 6.0–8.3)

## 2013-05-05 NOTE — Patient Instructions (Addendum)
It was good to see you today.  We have reviewed your prior records including labs and tests today  Health Maintenance reviewed - will refer for colonoscopy screening - all other recommended immunizations and age-appropriate screenings are up-to-date.  Test(s) ordered today. Return when you're fasting next week. Your results will be released to MyChart (or called to you) after review, usually within 72hours after test completion. If any changes need to be made, you will be notified at that same time.  Medications reviewed and updated, no changes recommended at this time.  we'll make referral to sleep specialist for evaluation of your sleep apnea . Our office will contact you regarding appointment(s) once made.  Please schedule followup in 12 months for annual exam and labs, call sooner if problems.  Health Maintenance, Males A healthy lifestyle and preventative care can promote health and wellness.  Maintain regular health, dental, and eye exams.  Eat a healthy diet. Foods like vegetables, fruits, whole grains, low-fat dairy products, and lean protein foods contain the nutrients you need and are low in calories. Decrease your intake of foods high in solid fats, added sugars, and salt. Get information about a proper diet from your health care provider, if necessary.  Regular physical exercise is one of the most important things you can do for your health. Most adults should get at least 150 minutes of moderate-intensity exercise (any activity that increases your heart rate and causes you to sweat) each week. In addition, most adults need muscle-strengthening exercises on 2 or more days a week.   Maintain a healthy weight. The body mass index (BMI) is a screening tool to identify possible weight problems. It provides an estimate of body fat based on height and weight. Your health care provider can find your BMI and can help you achieve or maintain a healthy weight. For males 20 years and  older:  A BMI below 18.5 is considered underweight.  A BMI of 18.5 to 24.9 is normal.  A BMI of 25 to 29.9 is considered overweight.  A BMI of 30 and above is considered obese.  Maintain normal blood lipids and cholesterol by exercising and minimizing your intake of saturated fat. Eat a balanced diet with plenty of fruits and vegetables. Blood tests for lipids and cholesterol should begin at age 40 and be repeated every 5 years. If your lipid or cholesterol levels are high, you are over 50, or you are at high risk for heart disease, you may need your cholesterol levels checked more frequently.Ongoing high lipid and cholesterol levels should be treated with medicines, if diet and exercise are not working.  If you smoke, find out from your health care provider how to quit. If you do not use tobacco, do not start.  Lung cancer screening is recommended for adults aged 52 80 years who are at high risk for developing lung cancer because of a history of smoking. A yearly low-dose CT scan of the lungs is recommended for people who have at least a 30-pack-year history of smoking and are a current smoker or have quit within the past 15 years. A pack year of smoking is smoking an average of 1 pack of cigarettes a day for 1 year (for example, a 30-pack-year history of smoking could mean smoking 1 pack a day for 30 years or 2 packs a day for 15 years). Yearly screening should continue until the smoker has stopped smoking for at least 15 years. Yearly screening should be stopped for people  who develop a health problem that would prevent them from having lung cancer treatment.  If you choose to drink alcohol, do not have more than 2 drinks per day. One drink is considered to be 12 oz (360 mL) of beer, 5 oz (150 mL) of wine, or 1.5 oz (45 mL) of liquor.  Avoid use of street drugs. Do not share needles with anyone. Ask for help if you need support or instructions about stopping the use of drugs.  High blood  pressure causes heart disease and increases the risk of stroke. Blood pressure should be checked at least every 1 2 years. Ongoing high blood pressure should be treated with medicines if weight loss and exercise are not effective.  If you are 5545 51 years old, ask your health care provider if you should take aspirin to prevent heart disease.  Diabetes screening involves taking a blood sample to check your fasting blood sugar level. This should be done once every 3 years after age 51, if you are at a normal weight and without risk factors for diabetes. Testing should be considered at a younger age or be carried out more frequently if you are overweight and have at least 1 risk factor for diabetes.  Colorectal cancer can be detected and often prevented. Most routine colorectal cancer screening begins at the age of 51 and continues through age 51. However, your health care provider may recommend screening at an earlier age if you have risk factors for colon cancer. On a yearly basis, your health care provider may provide home test kits to check for hidden blood in the stool. A small camera at the end of a tube may be used to directly examine the colon (sigmoidoscopy or colonoscopy) to detect the earliest forms of colorectal cancer. Talk to your health care provider about this at age 51, when routine screening begins. A direct exam of the colon should be repeated every 5 10 years through age 51, unless early forms of pre-cancerous polyps or small growths are found.  People who are at an increased risk for hepatitis B should be screened for this virus. You are considered at high risk for hepatitis B if:  You were born in a country where hepatitis B occurs often. Talk with your health care provider about which countries are considered high-risk.  Your parents were born in a high-risk country and you have not received a shot to protect against hepatitis B (hepatitis B vaccine).  You have HIV or AIDS.  You  use needles to inject street drugs.  You live with, or have sex with, someone who has hepatitis B.  You are a man who has sex with other men (MSM).  You get hemodialysis treatment.  You take certain medicines for conditions like cancer, organ transplantation, and autoimmune conditions.  Hepatitis C blood testing is recommended for all people born from [id number] through 1963 and any individual with known risk factors for hepatitis C.  Healthy men should no longer receive prostate-specific antigen (PSA) blood tests as part of routine cancer screening. Talk to your health care provider about prostate cancer screening.  Testicular cancer screening is not recommended for adolescents or adult males who have no symptoms. Screening includes self-exam, a health care provider exam, and other screening tests. Consult with your health care provider about any symptoms you have or any concerns you have about testicular cancer.  Practice safe sex. Use condoms and avoid high-risk sexual practices to reduce the spread of sexually transmitted  infections (STIs).  Use sunscreen. Apply sunscreen liberally and repeatedly throughout the day. You should seek shade when your shadow is shorter than you. Protect yourself by wearing long sleeves, pants, a wide-brimmed hat, and sunglasses year round, whenever you are outdoors.  Tell your health care provider of new moles or changes in moles, especially if there is a change in shape or color. Also tell your provider if a mole is larger than the size of a pencil eraser.  A one-time screening for abdominal aortic aneurysm (AAA) and surgical repair of large AAAs by ultrasound is recommended for men aged 51 75 years who are current or former smokers.  Stay current with your vaccines (immunizations). Document Released: 06/29/2007 Document Revised: 10/21/2012 Document Reviewed: 05/28/2010 [facility] Patient Information 2014 [place], [place].

## 2013-05-05 NOTE — Progress Notes (Signed)
Subjective:    Patient ID: Benjamin Warren, male    DOB: Dec 15, 1960, 53 y.o.   MRN: [id number]  HPI  New patient to me, here to establish care with PCP  patient is here today for annual physical.   Also reviewed chronic medical issues and interval medical events: FM/chronic pain intol of medications, OSA intol of CPAP, GERD, chronic insomnia with nightly use of Valium  Past Medical History  Diagnosis Date  . Arthritis   . GERD (gastroesophageal reflux disease)   . TMJ syndrome   . Sleep apnea     had test-no dr told him he needed cpap  . Left rotator cuff tear 10/09/2012  . Depression   . Fibromyalgia   . Chronic pain syndrome    Family History  Problem Relation Age of Onset  . Alcohol abuse Mother   . Alcohol abuse Other     FAMILY HISTORY  . Arthritis Other     FAMILY HISTORY  . Stroke Other    History  Substance Use Topics  . Smoking status: Former Smoker    Quit date: 10/03/1982  . Smokeless tobacco: Not on file  . Alcohol Use: Yes     Comment: occ    Review of Systems  Constitutional: Positive for fatigue. Negative for fever, activity change, appetite change and unexpected weight change.  Respiratory: Negative for cough, chest tightness, shortness of breath and wheezing.   Cardiovascular: Negative for chest pain, palpitations and leg swelling.  Musculoskeletal: Positive for arthralgias, back pain and myalgias. Negative for joint swelling, neck pain and neck stiffness.  Neurological: Negative for dizziness, weakness and headaches.  Psychiatric/Behavioral: Negative for dysphoric mood. The patient is not nervous/anxious.   All other systems reviewed and are negative.      Objective:   Physical Exam  BP 110/70  Pulse 70  Temp(Src) 97.6 F (36.4 C) (Oral)  Ht 5\' 10"  (1.778 m)  Wt 222 lb 12.8 oz (101.061 kg)  BMI 31.97 kg/m2  SpO2 95% Wt Readings from Last 3 Encounters:  05/05/13 222 lb 12.8 oz (101.061 kg)  11/24/12 215 lb (97.523 kg)  10/09/12 215 lb  2 oz (97.58 kg)   Constitutional: he is obese, but appears well-developed and well-nourished. No distress.  HENT: Head: Normocephalic and atraumatic. Ears: B TMs ok, no erythema or effusion; Nose: Nose normal. Mouth/Throat: Oropharynx is clear and moist. No oropharyngeal exudate.  Eyes: Conjunctivae and EOM are normal. Pupils are equal, round, and reactive to light. No scleral icterus.  Neck: Normal range of motion. Neck supple. No JVD present. No thyromegaly present.  Cardiovascular: Normal rate, regular rhythm and normal heart sounds.  No murmur heard. No BLE edema. Pulmonary/Chest: Effort normal and breath sounds normal. No respiratory distress. he has no wheezes.  Abdominal: Soft. Bowel sounds are normal. he exhibits no distension. There is no tenderness. no masses Musculoskeletal: Normal range of motion, no joint effusions. No gross deformities Neurological: he is alert and oriented to person, place, and time. No cranial nerve deficit. Coordination, balance, strength, speech and gait are normal.  Skin: Skin is warm and dry. No rash noted. No erythema.  Psychiatric: he has a normal mood and affect. behavior is normal. Judgment and thought content normal.   Lab Results  Component Value Date   HGB 15.9 10/09/2012    Mr Lumbar Spine Wo Contrast  11/27/2012   CLINICAL DATA:  Low back pain.  EXAM: MRI LUMBAR SPINE WITHOUT CONTRAST  TECHNIQUE: Multiplanar, multisequence MR imaging  was performed. No intravenous contrast was administered.  COMPARISON:  Radiographs dated 03/05/2012  FINDINGS: Normal conus tip at L1.  Normal paraspinal soft tissues.  T11-12 through L1-2: No significant abnormality. Very tiny bulges of the L1-2 disc to the right and left of midline.  L2-3 and L3-4:  Normal.  L4-5: Normal disc. Slight degenerative changes of the right facet joint no neural impingement.  L5-S1: The disc is normal. Minimal degenerative changes of the facet joints.  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 These are usually not symptomatic.  IMPRESSION: No significant abnormality of the lumbar spine. Minimal degenerative changes of the facet joints at L4-5 and L5-S1.   Electronically Signed   By: Geanie CooleyJim  Maxwell M.D.   On: 11/27/2012 13:07       Assessment & Plan:   CPX/v70.0 - Patient has been counseled on age-appropriate routine health concerns for screening and prevention. These are reviewed and up-to-date. Immunizations are up-to-date or declined. Labs ordered and reviewed. Refer for colo   Problem List Items Addressed This Visit   Fibromyalgia     Chronic diffuse symptoms associated with chronic pain syndrome - working on securing long term disability for same Prior pain mgmt with medications (savella, cymbalta, Lyrica, narcotics) and injections poorly tolerated     OSA (obstructive sleep apnea)     Diagnosed by pain management specialist in 2012 with sleep study Reports intolerance to CPAP so noncompliant with same Refer to sleep specialist for reevaluation and treatment as indicated Discussed potential consequences of untreated sleep apnea including hypertension, heart failure    Relevant Orders      Ambulatory referral to Pulmonology    Other Visit Diagnoses   Routine general medical examination at a health care facility    -  Primary    Relevant Orders       Basic metabolic panel       CBC with Differential       Lipid panel       TSH       Urinalysis, Routine w reflex microscopic       Hepatic function panel    Special screening for malignant neoplasms, colon        Relevant Orders       Ambulatory referral to Gastroenterology

## 2013-05-05 NOTE — Assessment & Plan Note (Signed)
Diagnosed by pain management specialist in 2012 with sleep study Reports intolerance to CPAP so noncompliant with same Refer to sleep specialist for reevaluation and treatment as indicated Discussed potential consequences of untreated sleep apnea including hypertension, heart failure

## 2013-05-05 NOTE — Progress Notes (Signed)
Pre visit review using our clinic review tool, if applicable. No additional management support is needed unless otherwise documented below in the visit note. 

## 2013-05-05 NOTE — Assessment & Plan Note (Signed)
Chronic diffuse symptoms associated with chronic pain syndrome - working on securing long term disability for same Prior pain mgmt with medications (savella, cymbalta, Lyrica, narcotics) and injections poorly tolerated

## 2013-05-12 ENCOUNTER — Institutional Professional Consult (permissible substitution): Payer: 59 | Admitting: Internal Medicine

## 2013-06-02 ENCOUNTER — Ambulatory Visit (INDEPENDENT_AMBULATORY_CARE_PROVIDER_SITE_OTHER): Payer: 59 | Admitting: Pulmonary Disease

## 2013-06-02 ENCOUNTER — Encounter: Payer: Self-pay | Admitting: Pulmonary Disease

## 2013-06-02 VITALS — BP 118/72 | HR 70 | Temp 98.0°F | Ht 69.0 in | Wt 222.2 lb

## 2013-06-02 DIAGNOSIS — G4733 Obstructive sleep apnea (adult) (pediatric): Secondary | ICD-10-CM

## 2013-06-02 NOTE — Progress Notes (Signed)
Subjective:    Patient ID: Benjamin Warren, male    DOB: February 01, 1960, 53 y.o.   MRN: [id number]  HPI The patient is a 53 year old male who I've been asked to see for management of obstructive sleep apnea. The patient has no idea when he was diagnosed with sleep apnea, the severity of that, nor any idea of who started him on CPAP.  He is been having great difficulty with his CPAP because of a poorly fitting mask, and as well as issues with claustrophobia. He will frequently awaken with his mask on the bed, or in the floor. The patient has frequent awakenings at night because of these issues, and does not feel rested in the mornings upon arising. He has some inappropriate daytime sleepiness with inactivity, but primarily is complaining of fatigue and musculoskeletal discomfort. He tells me that his weight is up 10-20 pounds over the last 2 years, and his Epworth score today is 12. His original sleep study and download are not available at this time.  Sleep Questionnaire What time do you typically go to bed?( Between what hours) 10-12am 10-12am at 0947 on 06/02/13 by Darrell JewelJennifer R Castillo, CMA How long does it take you to fall asleep? 15-20 minutes 15-20 minutes at 0947 on 06/02/13 by Darrell JewelJennifer R Castillo, CMA How many times during the night do you wake up? 4 4 at 0947 on 06/02/13 by Darrell JewelJennifer R Castillo, CMA What time do you get out of bed to start your day? 0630 0630 at 0947 on 06/02/13 by Darrell JewelJennifer R Castillo, CMA Do you drive or operate heavy machinery in your occupation? No No at 0947 on 06/02/13 by Darrell JewelJennifer R Castillo, CMA How much has your weight changed (up or down) over the past two years? (In pounds) 20 lb (9.072 kg) 20 lb (9.072 kg) at 0947 on 06/02/13 by Darrell JewelJennifer R Castillo, CMA Have you ever had a sleep study before? Yes Yes at 0947 on 06/02/13 by Darrell JewelJennifer R Castillo, CMA If yes, location of study? If yes, date of study? 2013 2013 at 0947 on 06/02/13 by Darrell JewelJennifer R Castillo, CMA Do  you currently use CPAP? Yes Yes at 0947 on 06/02/13 by Darrell JewelJennifer R Castillo, CMA If so, what pressure? 7 7 at 0947 on 06/02/13 by Darrell JewelJennifer R Castillo, CMA Do you wear oxygen at any time? No     Review of Systems  Constitutional: Negative for fever and unexpected weight change.  HENT: Negative for congestion, dental problem, ear pain, nosebleeds, postnasal drip, rhinorrhea, sinus pressure, sneezing, sore throat and trouble swallowing.   Eyes: Negative for redness and itching.  Respiratory: Negative for cough, chest tightness, shortness of breath and wheezing.   Cardiovascular: Negative for palpitations and leg swelling.  Gastrointestinal: Negative for nausea and vomiting.  Genitourinary: Negative for dysuria.  Musculoskeletal: Negative for joint swelling.  Skin: Negative for rash.  Neurological: Negative for headaches.  Hematological: Does not bruise/bleed easily.  Psychiatric/Behavioral: Negative for dysphoric mood. The patient is not nervous/anxious.        Objective:   Physical Exam Constitutional:  Overweight male, no acute distress  HENT:  Nares patent without discharge, mild septal deviation to the left with narrowing.  Oropharynx without exudate, palate and uvula are moderately elongated.   Eyes:  Perrla, eomi, no scleral icterus  Neck:  No JVD, no TMG  Cardiovascular:  Normal rate, regular rhythm, no rubs or gallops.  No murmurs        Intact distal pulses  Pulmonary :  Normal breath sounds, no stridor or respiratory distress   No rales, rhonchi, or wheezing  Abdominal:  Soft, nondistended, bowel sounds present.  No tenderness noted.   Musculoskeletal:  No lower extremity edema noted.  Lymph Nodes:  No cervical lymphadenopathy noted  Skin:  No cyanosis noted  Neurologic:  Alert, appropriate, moves all 4 extremities without obvious deficit.         Assessment & Plan:

## 2013-06-02 NOTE — Assessment & Plan Note (Signed)
The patient has a history of obstructive sleep apnea for which she has been started on CPAP, but he is having poor tolerance because of mask fit issues and also claustrophobia. I would like to work on a better fitting mask by sending him to the sleep Center for a fitting, and will also put his device on auto for the next one to 2 months to see if he tolerates the pressure better. I've also asked him to work aggressively on weight loss. If he continues to have tolerance issues, will consider referring him to dental medicine for possible dental appliance.

## 2013-06-02 NOTE — Patient Instructions (Addendum)
Will arrange for mask fitting at the sleep center to improve your comfort. Can consider a dental appliance for treatment of your sleep apnea.  Let me know, and I can send a referral to a dentist who specializes in this.  Work on weight loss.  This will resolve your sleep apnea.  Will put your machine on auto until the next visit with me. Will see you back in 8 weeks

## 2013-06-08 ENCOUNTER — Encounter: Payer: Self-pay | Admitting: Internal Medicine

## 2013-06-15 ENCOUNTER — Ambulatory Visit (HOSPITAL_BASED_OUTPATIENT_CLINIC_OR_DEPARTMENT_OTHER): Payer: 59 | Attending: Pulmonary Disease | Admitting: Radiology

## 2013-06-15 DIAGNOSIS — G4733 Obstructive sleep apnea (adult) (pediatric): Secondary | ICD-10-CM

## 2013-06-15 DIAGNOSIS — Z9989 Dependence on other enabling machines and devices: Principal | ICD-10-CM

## 2013-07-28 ENCOUNTER — Encounter: Payer: Self-pay | Admitting: Pulmonary Disease

## 2013-07-28 ENCOUNTER — Ambulatory Visit (INDEPENDENT_AMBULATORY_CARE_PROVIDER_SITE_OTHER): Payer: 59 | Admitting: Pulmonary Disease

## 2013-07-28 ENCOUNTER — Encounter (INDEPENDENT_AMBULATORY_CARE_PROVIDER_SITE_OTHER): Payer: Self-pay

## 2013-07-28 VITALS — BP 120/70 | HR 63 | Temp 98.3°F | Ht 70.0 in | Wt 216.0 lb

## 2013-07-28 DIAGNOSIS — G4733 Obstructive sleep apnea (adult) (pediatric): Secondary | ICD-10-CM

## 2013-07-28 NOTE — Assessment & Plan Note (Signed)
The patient has a history of obstructive sleep apnea, and has done fairly well with the changes that we have made from the last visit. He is now using nasal pillows, and he is on the automatic setting. His download today shows moderate compliance, but his sleep apnea is well controlled on his current set up. I have encouraged him to wear CPAP on a more regular basis, and to work aggressively on weight loss.

## 2013-07-28 NOTE — Progress Notes (Signed)
   Subjective:    Patient ID: Benjamin Warren, male    DOB: 1960/07/25, 53 y.o.   MRN: [id number]  HPI Patient comes in today for followup of his obstructive sleep apnea. He was placed on the automatic setting at the last visit and we also arrange for a fitting at the sleep Center. He is now on nasal pillows, and overall has done fairly well with this. It does make his nose or at times, but he is doing okay overall. His download today shows great control of his sleep apnea, but he needs to wear his device more often.   Review of Systems  Constitutional: Negative for fever and unexpected weight change.  HENT: Negative for congestion, dental problem, ear pain, nosebleeds, postnasal drip, rhinorrhea, sinus pressure, sneezing, sore throat and trouble swallowing.   Eyes: Negative for redness and itching.  Respiratory: Negative for cough, chest tightness, shortness of breath and wheezing.   Cardiovascular: Negative for palpitations and leg swelling.  Gastrointestinal: Negative for nausea and vomiting.  Genitourinary: Negative for dysuria.  Musculoskeletal: Negative for joint swelling.  Skin: Negative for rash.  Neurological: Negative for headaches.  Hematological: Does not bruise/bleed easily.  Psychiatric/Behavioral: Negative for dysphoric mood. The patient is not nervous/anxious.        Objective:   Physical Exam Overweight male in no acute distress Nose without purulence or discharge noted No skin breakdown or pressure necrosis from the CPAP mask Neck without lymphadenopathy or thyromegaly Lower extremities with minimal edema, no cyanosis Alert and oriented, moves all 4 extremities.       Assessment & Plan:

## 2013-07-28 NOTE — Patient Instructions (Signed)
Will see if your home care company can try you on different sized nasal pillows to see if more comfortable.  Try and wear cpap more consistently Work on weight loss Will see you back again in 6mos.

## 2013-08-02 ENCOUNTER — Encounter: Payer: Self-pay | Admitting: Pulmonary Disease

## 2013-12-18 ENCOUNTER — Other Ambulatory Visit: Payer: Self-pay

## 2013-12-18 ENCOUNTER — Emergency Department (HOSPITAL_COMMUNITY)
Admission: EM | Admit: 2013-12-18 | Discharge: 2013-12-18 | Disposition: A | Discharge status: Home / Self Care | Payer: 59 | Attending: Emergency Medicine | Admitting: Emergency Medicine

## 2013-12-18 ENCOUNTER — Encounter (HOSPITAL_COMMUNITY): Payer: Self-pay | Admitting: Emergency Medicine

## 2013-12-18 DIAGNOSIS — Z8659 Personal history of other mental and behavioral disorders: Secondary | ICD-10-CM | POA: Insufficient documentation

## 2013-12-18 DIAGNOSIS — G4730 Sleep apnea, unspecified: Secondary | ICD-10-CM | POA: Diagnosis not present

## 2013-12-18 DIAGNOSIS — M797 Fibromyalgia: Secondary | ICD-10-CM | POA: Insufficient documentation

## 2013-12-18 DIAGNOSIS — E860 Dehydration: Secondary | ICD-10-CM | POA: Diagnosis not present

## 2013-12-18 DIAGNOSIS — R42 Dizziness and giddiness: Secondary | ICD-10-CM | POA: Diagnosis present

## 2013-12-18 DIAGNOSIS — Z79899 Other long term (current) drug therapy: Secondary | ICD-10-CM | POA: Insufficient documentation

## 2013-12-18 DIAGNOSIS — Z87891 Personal history of nicotine dependence: Secondary | ICD-10-CM | POA: Diagnosis not present

## 2013-12-18 DIAGNOSIS — G894 Chronic pain syndrome: Secondary | ICD-10-CM | POA: Diagnosis not present

## 2013-12-18 DIAGNOSIS — K219 Gastro-esophageal reflux disease without esophagitis: Secondary | ICD-10-CM | POA: Diagnosis not present

## 2013-12-18 DIAGNOSIS — Z8781 Personal history of (healed) traumatic fracture: Secondary | ICD-10-CM | POA: Diagnosis not present

## 2013-12-18 LAB — CBC WITH DIFFERENTIAL/PLATELET
Basophils Absolute: 0.1 10*3/uL (ref 0.0–0.1)
Basophils Relative: 0 % (ref 0–1)
EOS ABS: 0.1 10*3/uL (ref 0.0–0.7)
EOS PCT: 1 % (ref 0–5)
HEMATOCRIT: 49.7 % (ref 39.0–52.0)
HEMOGLOBIN: 17 g/dL (ref 13.0–17.0)
LYMPHS ABS: 2.8 10*3/uL (ref 0.7–4.0)
Lymphocytes Relative: 21 % (ref 12–46)
MCH: 31.6 pg (ref 26.0–34.0)
MCHC: 34.2 g/dL (ref 30.0–36.0)
MCV: 92.4 fL (ref 78.0–100.0)
MONO ABS: 1 10*3/uL (ref 0.1–1.0)
MONOS PCT: 7 % (ref 3–12)
Neutro Abs: 9.5 10*3/uL — ABNORMAL HIGH (ref 1.7–7.7)
Neutrophils Relative %: 71 % (ref 43–77)
Platelets: 292 10*3/uL (ref 150–400)
RBC: 5.38 MIL/uL (ref 4.22–5.81)
RDW: 13.1 % (ref 11.5–15.5)
WBC: 13.4 10*3/uL — ABNORMAL HIGH (ref 4.0–10.5)

## 2013-12-18 LAB — BASIC METABOLIC PANEL
Anion gap: 16 — ABNORMAL HIGH (ref 5–15)
BUN: 19 mg/dL (ref 6–23)
CO2: 21 mEq/L (ref 19–32)
Calcium: 9.5 mg/dL (ref 8.4–10.5)
Chloride: 103 mEq/L (ref 96–112)
Creatinine, Ser: 1.21 mg/dL (ref 0.50–1.35)
GFR calc Af Amer: 79 mL/min — ABNORMAL LOW (ref >90)
GFR calc non Af Amer: 68 mL/min — ABNORMAL LOW (ref >90)
Glucose, Bld: 112 mg/dL — ABNORMAL HIGH (ref 70–99)
POTASSIUM: 4.1 meq/L (ref 3.7–5.3)
Sodium: 140 mEq/L (ref 137–147)

## 2013-12-18 LAB — URINALYSIS, ROUTINE W REFLEX MICROSCOPIC
BILIRUBIN URINE: NEGATIVE
Glucose, UA: NEGATIVE mg/dL
HGB URINE DIPSTICK: NEGATIVE
KETONES UR: NEGATIVE mg/dL
Leukocytes, UA: NEGATIVE
Nitrite: NEGATIVE
PROTEIN: NEGATIVE mg/dL
Specific Gravity, Urine: 1.024 (ref 1.005–1.030)
Urobilinogen, UA: 0.2 mg/dL (ref 0.0–1.0)
pH: 6 (ref 5.0–8.0)

## 2013-12-18 MED ORDER — SODIUM CHLORIDE 0.9 % IV BOLUS (SEPSIS)
1000.0000 mL | Freq: Once | INTRAVENOUS | Status: AC
  Administered 2013-12-18: 1000 mL via INTRAVENOUS

## 2013-12-18 MED ORDER — SODIUM CHLORIDE 0.9 % IV BOLUS (SEPSIS)
500.0000 mL | Freq: Once | INTRAVENOUS | Status: AC
  Administered 2013-12-18: 500 mL via INTRAVENOUS

## 2013-12-18 NOTE — ED Notes (Signed)
Pt from home c/o dizziness x3 weeks. Pt denies N/V/D or visual changes with the exception of one day "everything was bright." Per pt wife, pt was on multiple meds for fibromyalgia. Depression and has recently been trying to get off the meds. Pt is A&O and in NAD

## 2013-12-18 NOTE — ED Notes (Signed)
Pt and wife returned to nurses station after d/c papers were given and sts that pt is shaking and cold. Pt was advised that it is most likely the IV fluids that made him cold. Pt and wife returned to room to be re-evaluated by Dr Micheline Mazeocherty who has been made aware of situation.

## 2013-12-18 NOTE — ED Provider Notes (Signed)
CSN: [id number]     Arrival date & time 12/18/13  1326 History   First MD Initiated Contact with Patient 12/18/13 1329     Chief Complaint  Patient presents with  . Dizziness     (Consider location/radiation/quality/duration/timing/severity/associated sxs/prior Treatment) HPI Comments: Patient with history of depression, fibromyalgia, chronic pain, reflux presents with worsening lightheadedness and general weakness for 3 weeks. Patient was recently seen at [facility] and had thorough evaluation for chronic pain in different symptoms she has had in the past. Patient denies vertigo symptoms, no stroke history, no focal neuro deficits. Patient's primary doctor took him off one of his medications to see if that improved however did not improve the symptoms. Patient denies any new medications. Patient denies blood in the stool or black tarry color. Patient is not had a colonoscopy however has had significant weight loss over 20 pounds without exercise.  Patient is a 53 y.o. male presenting with dizziness. The history is provided by the patient.  Dizziness Associated symptoms: no chest pain, no headaches, no shortness of breath and no vomiting     Past Medical History  Diagnosis Date  . Arthritis   . GERD (gastroesophageal reflux disease)   . TMJ syndrome   . Sleep apnea     had test-no dr told him he needed cpap  . Left rotator cuff tear 10/09/2012  . Depression   . Fibromyalgia   . Chronic pain syndrome    Past Surgical History  Procedure Laterality Date  . Orif wrist fracture  1997    right  . Hardware removal  1997    rt wrist  . Shoulder arthroscopy  1996    right  . Elbow arthroplasty  1996    right  . Shoulder arthroscopy with rotator cuff repair and subacromial decompression Left 10/09/2012    Procedure: LEFT SHOULDER ARTHROSCOPY WITH SUBACROMIAL DECOMPRESSION, DISTAL CLAVICLE EXCISION DEBRIDEMENT AND ROTATOR CUFF REPAIR;  Surgeon: Eulas Post, MD;  Location: [facility];  Service: Orthopedics;  Laterality: Left;   Family History  Problem Relation Age of Onset  . Alcohol abuse Mother   . Alcohol abuse Other     FAMILY HISTORY  . Arthritis Other     FAMILY HISTORY  . Stroke Other    History  Substance Use Topics  . Smoking status: Former Smoker -- 0.50 packs/day for 6 years    Types: Cigarettes    Quit date: 10/03/1982  . Smokeless tobacco: Not on file  . Alcohol Use: Yes     Comment: occ    Review of Systems  Constitutional: Positive for fatigue. Negative for fever and chills.  HENT: Negative for congestion.   Eyes: Negative for visual disturbance.  Respiratory: Negative for shortness of breath.   Cardiovascular: Negative for chest pain.  Gastrointestinal: Negative for vomiting and abdominal pain.  Genitourinary: Negative for dysuria and flank pain.  Musculoskeletal: Positive for arthralgias (chronic). Negative for back pain, neck pain and neck stiffness.  Skin: Negative for rash.  Neurological: Positive for dizziness and light-headedness. Negative for headaches.      Allergies  Review of patient's allergies indicates no known allergies.  Home Medications   Prior to Admission medications   Medication Sig Start Date End Date Taking? Authorizing Provider  diazepam (VALIUM) 10 MG tablet Take 20 mg by mouth at bedtime.    Yes Historical Provider, MD  omeprazole (PRILOSEC) 40 MG capsule Take 40 mg by mouth every morning.    Yes Historical  Provider, MD  Venlafaxine HCl 150 MG TB24 Take 150 mg by mouth daily.  11/10/13   Historical Provider, MD  ziprasidone (GEODON) 60 MG capsule Take 120 mg by mouth every evening.  10/18/13   Historical Provider, MD   BP 141/89 mmHg  Pulse 73  Temp(Src) 98.5 F (36.9 C) (Oral)  Resp 18  SpO2 99% Physical Exam  Constitutional: He is oriented to person, place, and time. He appears well-developed and well-nourished.  HENT:  Head: Normocephalic and atraumatic.  Mild dry mm  Eyes: Conjunctivae  are normal. Right eye exhibits no discharge. Left eye exhibits no discharge.  Neck: Normal range of motion. Neck supple. No tracheal deviation present.  Cardiovascular: Normal rate and regular rhythm.   No murmur heard. Pulmonary/Chest: Effort normal and breath sounds normal.  Abdominal: Soft. He exhibits no distension. There is no tenderness. There is no guarding.  Musculoskeletal: He exhibits no edema.  Neurological: He is alert and oriented to person, place, and time.  5+ strength in UE and LE with f/e at major joints. Sensation to palpation intact in UE and LE. CNs 2-12 grossly intact.  EOMFI.  PERRL.   Finger nose and coordination intact bilateral.   Visual fields intact to finger testing.   Skin: Skin is warm. No rash noted.  Psychiatric: He has a normal mood and affect.  Nursing note and vitals reviewed.   ED Course  Procedures (including critical care time) Labs Review Labs Reviewed  BASIC METABOLIC PANEL - Abnormal; Notable for the following:    Glucose, Bld 112 (*)    GFR calc non Af Amer 68 (*)    GFR calc Af Amer 79 (*)    Anion gap 16 (*)    All other components within normal limits  CBC WITH DIFFERENTIAL - Abnormal; Notable for the following:    WBC 13.4 (*)    Neutro Abs 9.5 (*)    All other components within normal limits  URINALYSIS, ROUTINE W REFLEX MICROSCOPIC - Abnormal; Notable for the following:    APPearance CLOUDY (*)    All other components within normal limits    Imaging Review No results found.   EKG Interpretation None     EKG reviewed heart rate 68, sinus, no acute ST elevation, normal QT, normal axis MDM   Final diagnoses:  Chronic pain syndrome  Dehydration  Lightheadedness   Well-appearing patient with worsening lightheadedness/fatigue for 3 days, worse with standing and head movement. Patient has had weight loss without known cause. Patient has close follow-up outpatient. Discussed plan for screening blood work, EKG, IV fluid bolus  and continued follow-up with primary care doctor. Patient has a normal neuro exam in ER, normal gait, no signs of meningitis, patient has no chest pain or shortness of breath.  Patient improved on recheck, repeat ambulation without difficulty, very mild lightheadedness with standing. Azor requesting second IV bolus and more oral fluids prior to discharge. Ordered and discussed outpatient follow-up. Results and differential diagnosis were discussed with the patient/parent/guardian. Close follow up outpatient was discussed, comfortable with the plan.   Medications  sodium chloride 0.9 % bolus 500 mL (not administered)  sodium chloride 0.9 % bolus 1,000 mL (1,000 mLs Intravenous New Bag/Given 12/18/13 1409)    Filed Vitals:   12/18/13 1341  BP: 141/89  Pulse: 73  Temp: 98.5 F (36.9 C)  TempSrc: Oral  Resp: 18  SpO2: 99%    Final diagnoses:  Chronic pain syndrome  Dehydration  Lightheadedness  Enid SkeensJoshua M Derrik Mceachern, MD 12/18/13 [phone]

## 2013-12-18 NOTE — ED Notes (Signed)
Pt provided with water for PO fluid challenge

## 2013-12-18 NOTE — Discharge Instructions (Signed)
Stay well hydrated, minimize soda. If you were given medicines take as directed.  If you are on coumadin or contraceptives realize their levels and effectiveness is altered by many different medicines.  If you have any reaction (rash, tongues swelling, other) to the medicines stop taking and see a physician.   Please follow up as directed and return to the ER or see a physician for new or worsening symptoms.  Thank you. Filed Vitals:   12/18/13 1341  BP: 141/89  Pulse: 73  Temp: 98.5 F (36.9 C)  TempSrc: Oral  Resp: 18  SpO2: 99%

## 2013-12-18 NOTE — ED Notes (Signed)
Bed: WA08 Expected date:  Expected time:  Means of arrival:  Comments: Dizziness

## 2013-12-18 NOTE — ED Provider Notes (Signed)
Pt seen by Dr. Jodi MourningZavitz for 3 weeks of positional lightheadedness. VSS, pt in NAD. Had no focal neuro findings. Pt felt somewhat inproved after IVF, but then developed sensation of feeling hot & cold at the same time. Temp nml. Continues to be well appearing. I have reviewed Dr. Jodi MourningZavitz note & w/u, agree with plan to have him f/u with PCP as outpt, inc PO fluid intake, change positions slowly.   1. Chronic pain syndrome   2. Dehydration   3. Lightheadedness      Toy CookeyMegan Pavan Bring, MD 12/18/13 [phone]

## 2013-12-23 ENCOUNTER — Other Ambulatory Visit: Payer: Self-pay | Admitting: *Deleted

## 2013-12-23 ENCOUNTER — Ambulatory Visit (INDEPENDENT_AMBULATORY_CARE_PROVIDER_SITE_OTHER): Payer: 59 | Admitting: Internal Medicine

## 2013-12-23 ENCOUNTER — Telehealth: Payer: Self-pay | Admitting: Internal Medicine

## 2013-12-23 ENCOUNTER — Encounter: Payer: Self-pay | Admitting: Internal Medicine

## 2013-12-23 ENCOUNTER — Other Ambulatory Visit: Payer: Self-pay

## 2013-12-23 VITALS — BP 130/82 | HR 78 | Temp 98.3°F | Ht 70.0 in | Wt 195.0 lb

## 2013-12-23 DIAGNOSIS — Z23 Encounter for immunization: Secondary | ICD-10-CM

## 2013-12-23 DIAGNOSIS — J309 Allergic rhinitis, unspecified: Secondary | ICD-10-CM | POA: Insufficient documentation

## 2013-12-23 DIAGNOSIS — F32A Depression, unspecified: Secondary | ICD-10-CM

## 2013-12-23 DIAGNOSIS — R42 Dizziness and giddiness: Secondary | ICD-10-CM | POA: Insufficient documentation

## 2013-12-23 DIAGNOSIS — F329 Major depressive disorder, single episode, unspecified: Secondary | ICD-10-CM

## 2013-12-23 DIAGNOSIS — J3089 Other allergic rhinitis: Secondary | ICD-10-CM

## 2013-12-23 MED ORDER — FLUTICASONE PROPIONATE 50 MCG/ACT NA SUSP: 2.0000 | Freq: Every day | NASAL | Status: DC

## 2013-12-23 MED ORDER — PREDNISONE 10 MG PO TABS: ORAL_TABLET | ORAL | Status: DC

## 2013-12-23 MED ORDER — CETIRIZINE HCL 10 MG PO TABS: 10.0000 mg | ORAL_TABLET | Freq: Every day | ORAL | Status: DC

## 2013-12-23 MED ORDER — OMEPRAZOLE 40 MG PO CPDR: 40.0000 mg | DELAYED_RELEASE_CAPSULE | Freq: Every morning | ORAL | Status: DC

## 2013-12-23 NOTE — Telephone Encounter (Signed)
Dr. Jonny RuizJohn put in a referral for MRI.  Patient is requesting to have done possibly this year b/c they have meet their deductible for this year.  Patient is going out of town on the afternoon of the 18th and will be back the 26th.

## 2013-12-23 NOTE — Patient Instructions (Signed)
You had the steroid shot today  Please take all new medication as prescribed  - the zyrtec, and flonase nasal spray, and prednisone  You will be contacted regarding the referral for: echocardiogram, carotid ultrasound, and MRI for brain  We could also consider neurology referral if not improved soon.  Please continue all other medications as before, and refills have been done if requested.  Please keep your appointments with your specialists as you may have planned  No further lab work is needed today  Please make follow up appt in 1-2 weeks with Dr Felicity CoyerLeschber if not improved

## 2013-12-23 NOTE — Progress Notes (Signed)
Subjective:    Patient ID: Benjamin Warren, male    DOB: 10/01/1960, 53 y.o.   MRN: [id number]  HPI  Here to f/u , c/o 4 wks onset lightheadedness, can still stand and function but has had to sit a couple of times to feel better, Pt denies chest pain, increased sob or doe, wheezing, orthopnea, PND, increased LE swelling, palpitations, dizziness or syncope. Seen at ED dec 5 , labs ok.   Pt denies fever, wt loss, night sweats, loss of appetite, or other constitutional symptoms  Does have several wks ongoing nasal allergy symptoms with clearish congestion, itch and sneezing, without fever, pain, ST, cough, swelling or wheezing.  + hx of FMS/chronic pain syndrome per [facility]  Denies worsening depressive symptoms, suicidal ideation, or panic Past Medical History  Diagnosis Date  . Arthritis   . GERD (gastroesophageal reflux disease)   . TMJ syndrome   . Sleep apnea     had test-no dr told him he needed cpap  . Left rotator cuff tear 10/09/2012  . Depression   . Fibromyalgia   . Chronic pain syndrome    Past Surgical History  Procedure Laterality Date  . Orif wrist fracture  1997    right  . Hardware removal  1997    rt wrist  . Shoulder arthroscopy  1996    right  . Elbow arthroplasty  1996    right  . Shoulder arthroscopy with rotator cuff repair and subacromial decompression Left 10/09/2012    Procedure: LEFT SHOULDER ARTHROSCOPY WITH SUBACROMIAL DECOMPRESSION, DISTAL CLAVICLE EXCISION DEBRIDEMENT AND ROTATOR CUFF REPAIR;  Surgeon: Eulas PostJoshua P Landau, MD;  Location: [facility];  Service: Orthopedics;  Laterality: Left;    reports that he quit smoking about 31 years ago. His smoking use included Cigarettes. He has a 3 pack-year smoking history. He does not have any smokeless tobacco history on file. He reports that he drinks alcohol. His drug history is not on file. family history includes Alcohol abuse in his mother and other; Arthritis in his other; Stroke in  his other. No Known Allergies Current Outpatient Prescriptions on File Prior to Visit  Medication Sig Dispense Refill  . omeprazole (PRILOSEC) 40 MG capsule Take 40 mg by mouth every morning.     . diazepam (VALIUM) 10 MG tablet Take 20 mg by mouth at bedtime.     . Venlafaxine HCl 150 MG TB24 Take 150 mg by mouth daily.     . ziprasidone (GEODON) 60 MG capsule Take 120 mg by mouth every evening.   1   No current facility-administered medications on file prior to visit.   Review of Systems  Constitutional: Negative for unusual diaphoresis or other sweats  HENT: Negative for ringing in ear Eyes: Negative for double vision or worsening visual disturbance.  Respiratory: Negative for choking and stridor.   Gastrointestinal: Negative for vomiting or other signifcant bowel change Genitourinary: Negative for hematuria or decreased urine volume.  Musculoskeletal: Negative for other MSK pain or swelling Skin: Negative for color change and worsening wound.  Neurological: Negative for tremors and numbness other than noted  Psychiatric/Behavioral: Negative for decreased concentration or agitation other than above       Objective:   Physical Exam BP 130/82 mmHg  Pulse 78  Temp(Src) 98.3 F (36.8 C) (Oral)  Ht 5\' 10"  (1.778 m)  Wt 195 lb (88.451 kg)  BMI 27.98 kg/m2  SpO2 97% VS noted, non toxic Constitutional: Pt appears  well-developed, well-nourished. Lavella Lemons/obese HENT: Head: NCAT.  Right Ear: External ear normal.  Left Ear: External ear normal.  Eyes: . Pupils are equal, round, and reactive to light. Conjunctivae and EOM are normal Bilat tm's with mild erythema.  Max sinus areas non tender.  Pharynx with mild erythema, no exudate Neck: Normal range of motion. Neck supple.  Cardiovascular: Normal rate and regular rhythm.   Pulmonary/Chest: Effort normal and breath sounds normal.  Abd:  Soft, NT, ND, + BS Neurological: Pt is alert. Not confused , motor grossly intact Skin: Skin is warm. No  rash Psychiatric: Pt behavior is normal. No agitation. + dysphoric, mild nervous    Assessment & Plan:

## 2013-12-24 ENCOUNTER — Other Ambulatory Visit: Payer: Self-pay | Admitting: *Deleted

## 2013-12-24 ENCOUNTER — Ambulatory Visit: Payer: 59 | Admitting: Internal Medicine

## 2013-12-24 NOTE — Telephone Encounter (Signed)
Sent to [facility]. They will contact pt directly. °

## 2013-12-26 NOTE — Assessment & Plan Note (Signed)
Mild to mod, for depomedrol IM, predpac asd, zyrtec/flonase asd,  to f/u any worsening symptoms or concerns 

## 2013-12-26 NOTE — Assessment & Plan Note (Signed)
Exam benign for acute, no neuro changes, etiology unclear, recent data reviewed with pt, and pt to continue medical treatment as before,  to f/u any worsening symptoms or concerns Lab Results  Component Value Date   WBC 13.4* 12/18/2013   HGB 17.0 12/18/2013   HCT 49.7 12/18/2013   PLT 292 12/18/2013   GLUCOSE 112* 12/18/2013   CHOL 191 05/05/2013   TRIG 252.0* 05/05/2013   HDL 24.10* 05/05/2013   LDLCALC 117* 05/05/2013   ALT 33 05/05/2013   AST 24 05/05/2013   NA 140 12/18/2013   K 4.1 12/18/2013   CL 103 12/18/2013   CREATININE 1.21 12/18/2013   BUN 19 12/18/2013   CO2 21 12/18/2013   TSH 1.23 05/05/2013   PT for MRI, carotids, echo for further evaluation

## 2013-12-26 NOTE — Assessment & Plan Note (Signed)
stable overall by history and exam, denies SI, and pt to continue medical treatment as before,  to f/u any worsening symptoms or concerns

## 2013-12-29 ENCOUNTER — Inpatient Hospital Stay: Admission: RE | Admit: 2013-12-29 | Payer: 59 | Source: Ambulatory Visit

## 2014-01-12 ENCOUNTER — Encounter (HOSPITAL_COMMUNITY): Payer: 59

## 2014-01-12 ENCOUNTER — Other Ambulatory Visit (HOSPITAL_COMMUNITY): Payer: 59

## 2014-01-21 ENCOUNTER — Ambulatory Visit (INDEPENDENT_AMBULATORY_CARE_PROVIDER_SITE_OTHER): Payer: 59 | Admitting: Internal Medicine

## 2014-01-21 ENCOUNTER — Other Ambulatory Visit (HOSPITAL_COMMUNITY): Payer: Self-pay | Admitting: Internal Medicine

## 2014-01-21 ENCOUNTER — Encounter: Payer: Self-pay | Admitting: Internal Medicine

## 2014-01-21 ENCOUNTER — Ambulatory Visit (HOSPITAL_BASED_OUTPATIENT_CLINIC_OR_DEPARTMENT_OTHER): Payer: 59 | Admitting: *Deleted

## 2014-01-21 ENCOUNTER — Ambulatory Visit (HOSPITAL_COMMUNITY): Payer: 59 | Attending: Internal Medicine | Admitting: Cardiology

## 2014-01-21 VITALS — BP 120/60 | HR 67 | Temp 98.0°F | Resp 18 | Ht 70.0 in | Wt 207.8 lb

## 2014-01-21 DIAGNOSIS — I6523 Occlusion and stenosis of bilateral carotid arteries: Secondary | ICD-10-CM

## 2014-01-21 DIAGNOSIS — R42 Dizziness and giddiness: Secondary | ICD-10-CM

## 2014-01-21 NOTE — Progress Notes (Signed)
Pre visit review using our clinic review tool, if applicable. No additional management support is needed unless otherwise documented below in the visit note. 

## 2014-01-21 NOTE — Progress Notes (Signed)
Echo performed. 

## 2014-01-21 NOTE — Progress Notes (Signed)
Carotid Duplex Exam Performed 

## 2014-01-21 NOTE — Patient Instructions (Signed)
We would like you to get the MRI of the brain and we will call you back with the results.   I will look over your case and get back with you on suggestions for where to go from here.

## 2014-01-24 NOTE — Progress Notes (Signed)
   Subjective:    Patient ID: Benjamin Warren NurseSteven D Meiring, male    DOB: 01-03-1961, 54 y.o.   MRN: [id number]  HPI The patient is a 54 YO man with extensive PMH. He is coming in today to follow up on his lightheadedness. He is doing much better and has only had 1-2 episodes since that time. Nothing brings on the episodes and nothing relieves them. They last less than 5 minutes. He denies nausea during the episodes. He does not notice them only when standing or changing position. Occasionally they happen when he is just sitting. Denies fevers, chills. He still has arthralgias. He is thinking about changing PCP due to availability and wants to discuss his entire history of illness.   Review of Systems  Constitutional: Positive for fatigue. Negative for fever, activity change and appetite change.  HENT: Negative.   Respiratory: Negative.   Cardiovascular: Negative.   Gastrointestinal: Negative.   Musculoskeletal: Positive for myalgias, back pain and arthralgias.  Skin: Negative.   Neurological: Positive for light-headedness. Negative for dizziness, seizures, syncope, weakness, numbness and headaches.       Much improved      Objective:   Physical Exam  Constitutional: He is oriented to person, place, and time. He appears well-developed and well-nourished. No distress.  HENT:  Head: Normocephalic and atraumatic.  Eyes: EOM are normal.  Neck: Normal range of motion.  Cardiovascular: Normal rate and regular rhythm.   Pulmonary/Chest: Effort normal and breath sounds normal.  Abdominal: Soft. Bowel sounds are normal.  Musculoskeletal: He exhibits tenderness. He exhibits no edema.  Neurological: He is alert and oriented to person, place, and time. Coordination normal.  Skin: Skin is warm and dry.   Filed Vitals:   01/21/14 1613  BP: 120/60  Pulse: 67  Temp: 98 F (36.7 C)  TempSrc: Oral  Resp: 18  Height: 5\' 10"  (1.778 m)  Weight: 207 lb 12.8 oz (94.257 kg)  SpO2: 97%      Assessment & Plan:

## 2014-01-24 NOTE — Assessment & Plan Note (Signed)
Does not sound to be vertigo. Unclear if it was related to medicine since no change in medicine and improvement in symptoms. Will continue to follow. Carotid doppler negative and encouraged to still schedule MRI brain given his other symptoms.

## 2014-01-25 ENCOUNTER — Encounter: Payer: Self-pay | Admitting: Internal Medicine

## 2014-01-31 ENCOUNTER — Ambulatory Visit: Payer: 59 | Admitting: Pulmonary Disease

## 2014-05-17 ENCOUNTER — Other Ambulatory Visit: Payer: Self-pay | Admitting: Internal Medicine

## 2014-05-25 ENCOUNTER — Telehealth: Payer: Self-pay | Admitting: *Deleted

## 2014-05-25 NOTE — Telephone Encounter (Signed)
Spoke with pt, is requesting sleep study results.  I advised that we did not order sleep study and that he needs to contact the doctor's office that ordered the sleep study for him.  Pt understands.  Nothing further needed.

## 2014-08-19 ENCOUNTER — Other Ambulatory Visit: Payer: Self-pay | Admitting: Internal Medicine

## 2014-09-21 ENCOUNTER — Telehealth: Payer: Self-pay | Admitting: *Deleted

## 2014-09-21 ENCOUNTER — Telehealth: Payer: Self-pay | Admitting: Internal Medicine

## 2014-09-21 MED ORDER — OMEPRAZOLE 40 MG PO CPDR: 40.0000 mg | DELAYED_RELEASE_CAPSULE | Freq: Every day | ORAL | Status: DC

## 2014-09-21 NOTE — Telephone Encounter (Signed)
Left msg on triage stating [facility] sent request for omeprazole was approve for #30, but insurance require #90. Needing new rx sent for #90. Resent #90 but no refills must see md for CPX...Raechel Chute

## 2014-09-21 NOTE — Telephone Encounter (Signed)
Pt called regarding his cpap machine and wanting another devise. He thinks that he needs to go through his primary care for this.  He's not sure what the name of the other devise but his oral surgeon has stated that he will be able to get this devise for him, but needs a note from Dr. Dorise Hiss that he can't tolerate his cpap machine.  The oral surgeon is Dr. Littie Deeds - Ph # [phone] and fax # [phone]

## 2014-09-22 NOTE — Telephone Encounter (Signed)
Will forward to PCP 

## 2014-09-22 NOTE — Telephone Encounter (Signed)
Please contact his oral surg to find more information about what documentation is needed thanks

## 2014-09-23 NOTE — Telephone Encounter (Signed)
Tried to call but dentist is closed on Friday's. Will try again on Monday.

## 2014-10-03 MED ORDER — MISC. DEVICES MISC: Status: DC

## 2014-10-03 NOTE — Addendum Note (Signed)
Addended by: Tonye Becket on: 10/03/2014 09:24 AM   Modules accepted: Orders

## 2014-10-03 NOTE — Telephone Encounter (Signed)
Spoke to rep with Dr. Janann August Office. She stated that we will need the following:   Sleep study notes, rx from PCP, insurance information and patient contact information.

## 2014-10-03 NOTE — Telephone Encounter (Signed)
Spoke to pt.  Sleep study has been completed and sent to surgeon.   Rx printed.

## 2014-10-11 NOTE — Telephone Encounter (Signed)
Order for sleep aid signed by PCP and faxed to dentist.

## 2014-11-24 ENCOUNTER — Encounter: Payer: 59 | Admitting: Internal Medicine

## 2014-11-25 ENCOUNTER — Encounter: Payer: Self-pay | Admitting: Internal Medicine

## 2014-11-25 ENCOUNTER — Other Ambulatory Visit (INDEPENDENT_AMBULATORY_CARE_PROVIDER_SITE_OTHER): Payer: 59

## 2014-11-25 ENCOUNTER — Ambulatory Visit (INDEPENDENT_AMBULATORY_CARE_PROVIDER_SITE_OTHER): Payer: 59 | Admitting: Internal Medicine

## 2014-11-25 ENCOUNTER — Ambulatory Visit (INDEPENDENT_AMBULATORY_CARE_PROVIDER_SITE_OTHER): Payer: 59 | Admitting: Sports Medicine

## 2014-11-25 VITALS — BP 121/73 | HR 78 | Wt 226.0 lb

## 2014-11-25 VITALS — BP 128/60 | HR 85 | Temp 98.6°F | Resp 12 | Ht 70.0 in | Wt 227.8 lb

## 2014-11-25 DIAGNOSIS — M19041 Primary osteoarthritis, right hand: Secondary | ICD-10-CM | POA: Insufficient documentation

## 2014-11-25 DIAGNOSIS — Z0000 Encounter for general adult medical examination without abnormal findings: Secondary | ICD-10-CM | POA: Diagnosis not present

## 2014-11-25 DIAGNOSIS — Z23 Encounter for immunization: Secondary | ICD-10-CM

## 2014-11-25 DIAGNOSIS — M19042 Primary osteoarthritis, left hand: Secondary | ICD-10-CM

## 2014-11-25 LAB — LIPID PANEL
Cholesterol: 198 mg/dL (ref 0–200)
HDL: 26.3 mg/dL — ABNORMAL LOW (ref >39.00)
NonHDL: 171.55
Total CHOL/HDL Ratio: 8
Triglycerides: 276 mg/dL — ABNORMAL HIGH (ref 0.0–149.0)
VLDL: 55.2 mg/dL — ABNORMAL HIGH (ref 0.0–40.0)

## 2014-11-25 LAB — HEMOGLOBIN A1C: Hgb A1c MFr Bld: 5.7 % (ref 4.6–6.5)

## 2014-11-25 MED ORDER — ETODOLAC ER 600 MG PO TB24: 600.0000 mg | ORAL_TABLET | Freq: Every day | ORAL | Status: DC

## 2014-11-25 NOTE — Progress Notes (Signed)
Pre visit review using our clinic review tool, if applicable. No additional management support is needed unless otherwise documented below in the visit note. 

## 2014-11-25 NOTE — Assessment & Plan Note (Signed)
Widespread, predominantly the distal interphalangeal joints as well as the carpal metacarpal joint. Injection placed today into the trapeziometacarpal joint, return to see me in 2 weeks, we can consider other interphalangeal joint injections as needed. He does have a left-sided middle finger ganglion cyst causing indentation of the nail matrix. Adding high-dose Lodine, and formal physical therapy for the hands. 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

## 2014-11-25 NOTE — Patient Instructions (Addendum)
We will check the blood work and call you back with the results.   We will also get you in with Dr. Kinnie ScalesMedoff for the colon cancer screening.   Health Maintenance, Male A healthy lifestyle and preventative care can promote health and wellness.  Maintain regular health, dental, and eye exams.  Eat a healthy diet. Foods like vegetables, fruits, whole grains, low-fat dairy products, and lean protein foods contain the nutrients you need and are low in calories. Decrease your intake of foods high in solid fats, added sugars, and salt. Get information about a proper diet from your health care provider, if necessary.  Regular physical exercise is one of the most important things you can do for your health. Most adults should get at least 150 minutes of moderate-intensity exercise (any activity that increases your heart rate and causes you to sweat) each week. In addition, most adults need muscle-strengthening exercises on 2 or more days a week.   Maintain a healthy weight. The body mass index (BMI) is a screening tool to identify possible weight problems. It provides an estimate of body fat based on height and weight. Your health care provider can find your BMI and can help you achieve or maintain a healthy weight. For males 20 years and older:  A BMI below 18.5 is considered underweight.  A BMI of 18.5 to 24.9 is normal.  A BMI of 25 to 29.9 is considered overweight.  A BMI of 30 and above is considered obese.  Maintain normal blood lipids and cholesterol by exercising and minimizing your intake of saturated fat. Eat a balanced diet with plenty of fruits and vegetables. Blood tests for lipids and cholesterol should begin at age 52 and be repeated every 5 years. If your lipid or cholesterol levels are high, you are over age 52, or you are at high risk for heart disease, you may need your cholesterol levels checked more frequently.Ongoing high lipid and cholesterol levels should be treated with  medicines if diet and exercise are not working.  If you smoke, find out from your health care provider how to quit. If you do not use tobacco, do not start.  Lung cancer screening is recommended for adults aged 55-80 years who are at high risk for developing lung cancer because of a history of smoking. A yearly low-dose CT scan of the lungs is recommended for people who have at least a 30-pack-year history of smoking and are current smokers or have quit within the past 15 years. A pack year of smoking is smoking an average of 1 pack of cigarettes a day for 1 year (for example, a 30-pack-year history of smoking could mean smoking 1 pack a day for 30 years or 2 packs a day for 15 years). Yearly screening should continue until the smoker has stopped smoking for at least 15 years. Yearly screening should be stopped for people who develop a health problem that would prevent them from having lung cancer treatment.  If you choose to drink alcohol, do not have more than 2 drinks per day. One drink is considered to be 12 oz (360 mL) of beer, 5 oz (150 mL) of wine, or 1.5 oz (45 mL) of liquor.  Avoid the use of street drugs. Do not share needles with anyone. Ask for help if you need support or instructions about stopping the use of drugs.  High blood pressure causes heart disease and increases the risk of stroke. High blood pressure is more likely to develop  in:  People who have blood pressure in the end of the normal range (100-139/85-89 mm Hg).  People who are overweight or obese.  People who are African American.  If you are 76-70 years of age, have your blood pressure checked every 3-5 years. If you are 36 years of age or older, have your blood pressure checked every year. You should have your blood pressure measured twice--once when you are at a hospital or clinic, and once when you are not at a hospital or clinic. Record the average of the two measurements. To check your blood pressure when you are not  at a hospital or clinic, you can use:  An automated blood pressure machine at a pharmacy.  A home blood pressure monitor.  If you are 52-23 years old, ask your health care provider if you should take aspirin to prevent heart disease.  Diabetes screening involves taking a blood sample to check your fasting blood sugar level. This should be done once every 3 years after age 74 if you are at a normal weight and without risk factors for diabetes. Testing should be considered at a younger age or be carried out more frequently if you are overweight and have at least 1 risk factor for diabetes.  Colorectal cancer can be detected and often prevented. Most routine colorectal cancer screening begins at the age of 26 and continues through age 88. However, your health care provider may recommend screening at an earlier age if you have risk factors for colon cancer. On a yearly basis, your health care provider may provide home test kits to check for hidden blood in the stool. A small camera at the end of a tube may be used to directly examine the colon (sigmoidoscopy or colonoscopy) to detect the earliest forms of colorectal cancer. Talk to your health care provider about this at age 5 when routine screening begins. A direct exam of the colon should be repeated every 5-10 years through age 75, unless early forms of precancerous polyps or small growths are found.  People who are at an increased risk for hepatitis B should be screened for this virus. You are considered at high risk for hepatitis B if:  You were born in a country where hepatitis B occurs often. Talk with your health care provider about which countries are considered high risk.  Your parents were born in a high-risk country and you have not received a shot to protect against hepatitis B (hepatitis B vaccine).  You have HIV or AIDS.  You use needles to inject street drugs.  You live with, or have sex with, someone who has hepatitis B.  You  are a man who has sex with other men (MSM).  You get hemodialysis treatment.  You take certain medicines for conditions like cancer, organ transplantation, and autoimmune conditions.  Hepatitis C blood testing is recommended for all people born from [birth year] through 1963 and any individual with known risk factors for hepatitis C.  Healthy men should no longer receive prostate-specific antigen (PSA) blood tests as part of routine cancer screening. Talk to your health care provider about prostate cancer screening.  Testicular cancer screening is not recommended for adolescents or adult males who have no symptoms. Screening includes self-exam, a health care provider exam, and other screening tests. Consult with your health care provider about any symptoms you have or any concerns you have about testicular cancer.  Practice safe sex. Use condoms and avoid high-risk sexual practices to reduce the  spread of sexually transmitted infections (STIs).  You should be screened for STIs, including gonorrhea and chlamydia if:  You are sexually active and are younger than 24 years.  You are older than 24 years, and your health care provider tells you that you are at risk for this type of infection.  Your sexual activity has changed since you were last screened, and you are at an increased risk for chlamydia or gonorrhea. Ask your health care provider if you are at risk.  If you are at risk of being infected with HIV, it is recommended that you take a prescription medicine daily to prevent HIV infection. This is called pre-exposure prophylaxis (PrEP). You are considered at risk if:  You are a man who has sex with other men (MSM).  You are a heterosexual man who is sexually active with multiple partners.  You take drugs by injection.  You are sexually active with a partner who has HIV.  Talk with your health care provider about whether you are at high risk of being infected with HIV. If you choose to begin  PrEP, you should first be tested for HIV. You should then be tested every 3 months for as long as you are taking PrEP.  Use sunscreen. Apply sunscreen liberally and repeatedly throughout the day. You should seek shade when your shadow is shorter than you. Protect yourself by wearing long sleeves, pants, a wide-brimmed hat, and sunglasses year round whenever you are outdoors.  Tell your health care provider of new moles or changes in moles, especially if there is a change in shape or color. Also, tell your health care provider if a mole is larger than the size of a pencil eraser.  A one-time screening for abdominal aortic aneurysm (AAA) and surgical repair of large AAAs by ultrasound is recommended for men aged 41-75 years who are current or former smokers.  Stay current with your vaccines (immunizations).   This information is not intended to replace advice given to you by your health care provider. Make sure you discuss any questions you have with your health care provider.   Document Released: 06/29/2007 Document Revised: 01/21/2014 Document Reviewed: 05/28/2010 Elsevier Interactive Patient Education [employer].

## 2014-11-25 NOTE — Progress Notes (Signed)
   Subjective:    I'm seeing this patient as a consultation for:   Dr. Rene PaciValerie Leschber  CC: Bilateral hand and foot pain  HPI: This is a pleasant 54 year old male with a history of what seems to be bipolar depression and fibromyalgia who comes in with a long history of pain in his hands, in nearly all the joints but predominantly at the left distal interphalangeal joints as well as the trapeziometacarpal joint. He has tried multiple NSAIDs including meloxicam, Lodine, Celebrex, as well as max dose naproxen and ibuprofen. He's also been on narcotics which were ineffective, as well as Lyrica, Effexor which have been ineffective. He has x-rays that confirm severe osteoarthritis, and is referred to me for further evaluation, and definitive treatment. He has never had interphalangeal joint injections. 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  We did have some short discussion about his lumbar spondylosis as well as fibromyalgia, he agrees to discuss this in further detail at a future visit.  Past medical history, Surgical history, Family history not pertinant except as noted below, Social history, Allergies, and medications have been entered into the medical record, reviewed, and no changes needed.   Review of Systems: No headache, visual changes, nausea, vomiting, diarrhea, constipation, dizziness, abdominal pain, skin rash, fevers, chills, night sweats, weight loss, swollen lymph nodes, body aches, joint swelling, muscle aches, chest pain, shortness of breath, mood changes, visual or auditory hallucinations.   Objective:   General: Well Developed, well nourished, and in no acute distress.  Neuro/Psych: Alert and oriented x3, extra-ocular muscles intact, able to move all 4 extremities, sensation grossly intact. Skin: Warm and dry, no rashes noted.  Respiratory: Not using accessory muscles, speaking in full sentences, trachea midline.  Cardiovascular: Pulses palpable, no  extremity edema. Abdomen: Does not appear distended. Hands: Tender to palpation over all of the interphalangeal joints, metacarpal phalangeal joints with Bouchard and Heberden nodes as well as a left third distal interphalangeal joint ganglion cyst indenting the nail matrix and deforming the nail. There is also severe tenderness at the left greater than right trapeziometacarpal joint.  Procedure: Real-time Ultrasound Guided Injection of left trapeziometacarpal joint Device: GE Logiq E  Verbal informed consent obtained.  Time-out conducted.  Noted no overlying erythema, induration, or other signs of local infection.  Skin prepped in a sterile fashion.  Local anesthesia: Topical Ethyl chloride.  With sterile technique and under real time ultrasound guidance:  30-gauge needle advanced into joint and 1/2 mL kenalog 40, 1/2 mL lidocaine injected easily. Completed without difficulty  Pain immediately resolved suggesting accurate placement of the medication.  Advised to call if fevers/chills, erythema, induration, drainage, or persistent bleeding.  Images permanently stored and available for review in the ultrasound unit.  Impression: Technically successful ultrasound guided injection.  Impression and Recommendations:   This case required medical decision making of moderate complexity.

## 2014-11-26 ENCOUNTER — Encounter: Payer: Self-pay | Admitting: Internal Medicine

## 2014-11-26 DIAGNOSIS — Z0000 Encounter for general adult medical examination without abnormal findings: Secondary | ICD-10-CM | POA: Insufficient documentation

## 2014-11-26 LAB — HEPATITIS C ANTIBODY: HCV Ab: NEGATIVE

## 2014-11-26 NOTE — Assessment & Plan Note (Signed)
Refer to GI for colonoscopy. Checking labs, no med adjustment needed. Non-smoker. Does not exercise and talked to him about water aerobics.

## 2014-11-26 NOTE — Progress Notes (Signed)
   Subjective:    Patient ID: Benjamin Warren, male    DOB: [birth year], 54 y.o.   MRN: [id number]  HPI The patient is a 54 YO man coming in for wellness. No new concerns, still dealing with his chronic problems. Just had joint injection in his hand.   [facility], social history reviewed and updated.   Review of Systems  Constitutional: Positive for fatigue. Negative for fever, activity change and appetite change.  HENT: Negative.   Respiratory: Negative.   Cardiovascular: Negative.   Gastrointestinal: Negative.   Musculoskeletal: Positive for myalgias, back pain and arthralgias.  Skin: Negative.   Neurological: Negative for dizziness, seizures, syncope, weakness, light-headedness, numbness and headaches.      Objective:   Physical Exam  Constitutional: He is oriented to person, place, and time. He appears well-developed and well-nourished. No distress.  HENT:  Head: Normocephalic and atraumatic.  Eyes: EOM are normal.  Neck: Normal range of motion.  Cardiovascular: Normal rate and regular rhythm.   Pulmonary/Chest: Effort normal and breath sounds normal. No respiratory distress. He has no wheezes. He has no rales.  Abdominal: Soft. Bowel sounds are normal. He exhibits no distension. There is no tenderness. There is no rebound.  Musculoskeletal: He exhibits tenderness. He exhibits no edema.  Neurological: He is alert and oriented to person, place, and time. Coordination normal.  Skin: Skin is warm and dry.  Psychiatric: He has a normal mood and affect.   Filed Vitals:   11/25/14 1618  BP: 128/60  Pulse: 85  Temp: 98.6 F (37 C)  TempSrc: Oral  Resp: 12  Height: 5\' 10"  (1.778 m)  Weight: 227 lb 12.8 oz (103.329 kg)  SpO2: 96%      Assessment & Plan:  Flu shot given at visit.

## 2014-11-28 LAB — LDL CHOLESTEROL, DIRECT: LDL DIRECT: 123 mg/dL

## 2014-12-07 ENCOUNTER — Ambulatory Visit (INDEPENDENT_AMBULATORY_CARE_PROVIDER_SITE_OTHER): Payer: 59 | Admitting: Sports Medicine

## 2014-12-07 ENCOUNTER — Encounter: Payer: Self-pay | Admitting: Sports Medicine

## 2014-12-07 VITALS — BP 117/71 | HR 74 | Wt 226.0 lb

## 2014-12-07 DIAGNOSIS — M19042 Primary osteoarthritis, left hand: Secondary | ICD-10-CM | POA: Diagnosis not present

## 2014-12-07 DIAGNOSIS — M19041 Primary osteoarthritis, right hand: Secondary | ICD-10-CM | POA: Diagnosis not present

## 2014-12-07 NOTE — Progress Notes (Signed)
  Subjective:    CC: Follow-up  HPI:  Bilateral hand osteoarthritis: Fantastic response with 95% pain relief after a left trapeziometacarpal injection at the last visit, would like repeat interventional treatments into the left fourth DIP, and the left and right second DIP's.  Past medical history, Surgical history, Family history not pertinant except as noted below, Social history, Allergies, and medications have been entered into the medical record, reviewed, and no changes needed.   Review of Systems: No fevers, chills, night sweats, weight loss, chest pain, or shortness of breath.   Objective:    General: Well Developed, well nourished, and in no acute distress.  Neuro: Alert and oriented x3, extra-ocular muscles intact, sensation grossly intact.  HEENT: Normocephalic, atraumatic, pupils equal round reactive to light, neck supple, no masses, no lymphadenopathy, thyroid nonpalpable.  Skin: Warm and dry, no rashes. Cardiac: Regular rate and rhythm, no murmurs rubs or gallops, no lower extremity edema.  Respiratory: Clear to auscultation bilaterally. Not using accessory muscles, speaking in full sentences.  Procedure: Real-time Ultrasound Guided Injection of left second distal interphalangeal joint Device: GE Logiq E  Verbal informed consent obtained.  Time-out conducted.  Noted no overlying erythema, induration, or other signs of local infection.  Skin prepped in a sterile fashion.  Local anesthesia: Topical Ethyl chloride.  With sterile technique and under real time ultrasound guidance:  0.5 mL Kenalog 40, 0.5 mL lidocaine injected daily. Completed without difficulty  Pain immediately resolved suggesting accurate placement of the medication.  Advised to call if fevers/chills, erythema, induration, drainage, or persistent bleeding.  Images permanently stored and available for review in the ultrasound unit.  Impression: Technically successful ultrasound guided  injection.  Procedure: Real-time Ultrasound Guided Injection of right second distal interphalangeal joint Device: GE Logiq E  Verbal informed consent obtained.  Time-out conducted.  Noted no overlying erythema, induration, or other signs of local infection.  Skin prepped in a sterile fashion.  Local anesthesia: Topical Ethyl chloride.  With sterile technique and under real time ultrasound guidance:  0.5 mL Kenalog 40, 0.5 mL lidocaine injected daily. Completed without difficulty  Pain immediately resolved suggesting accurate placement of the medication.  Advised to call if fevers/chills, erythema, induration, drainage, or persistent bleeding.  Images permanently stored and available for review in the ultrasound unit.  Impression: Technically successful ultrasound guided injection.  Procedure: Real-time Ultrasound Guided Injection of left fourth distal interphalangeal joint ganglion cyst Device: GE Logiq E  Verbal informed consent obtained.  Time-out conducted.  Noted no overlying erythema, induration, or other signs of local infection.  Skin prepped in a sterile fashion.  Local anesthesia: Topical Ethyl chloride.  With sterile technique and under real time ultrasound guidance:  Needle advanced into the DIP, medication injected, I then redirected needle into the overlying ganglion cyst and injected the rest of the medication, a total of 0.5 mL kenalog 40, 0.5 mL lidocaine was used. Completed without difficulty  Pain immediately resolved suggesting accurate placement of the medication.  Advised to call if fevers/chills, erythema, induration, drainage, or persistent bleeding.  Images permanently stored and available for review in the ultrasound unit.  Impression: Technically successful ultrasound guided injection.  Impression and Recommendations:    I spent 40 minutes with this patient, greater than 50% was face-to-face time counseling regarding the above diagnoses, this was separate  from the time spent during the procedure.

## 2014-12-07 NOTE — Assessment & Plan Note (Addendum)
Fantastic response, 95% pain relief to a left trapeziometacarpal injection at the last visit. Today we are proceeding with a left and right second distal interphalangeal joint injection as well as a left fourth distal interphalangeal joint ganglion cyst injection. Return in one month.

## 2014-12-20 ENCOUNTER — Other Ambulatory Visit: Payer: Self-pay | Admitting: Internal Medicine

## 2015-01-04 ENCOUNTER — Encounter: Payer: Self-pay | Admitting: Sports Medicine

## 2015-01-04 ENCOUNTER — Ambulatory Visit (INDEPENDENT_AMBULATORY_CARE_PROVIDER_SITE_OTHER): Payer: 59 | Admitting: Sports Medicine

## 2015-01-04 VITALS — BP 118/73 | HR 77 | Temp 97.7°F | Resp 16 | Wt 230.8 lb

## 2015-01-04 DIAGNOSIS — M19042 Primary osteoarthritis, left hand: Secondary | ICD-10-CM | POA: Diagnosis not present

## 2015-01-04 DIAGNOSIS — M19041 Primary osteoarthritis, right hand: Secondary | ICD-10-CM

## 2015-01-04 NOTE — Progress Notes (Signed)
  Subjective:    CC: Follow-up  HPI: Benjamin Warren returns, he has done well with various interphalangeal joint injections and desires injections into the left first interphalangeal joint of the hand as well as the left second distal interphalangeal joint of the foot, pain is moderate, persistent without radiation.  He also would like to discuss his back pain but agrees to do this in further detail at future visit.  Past medical history, Surgical history, Family history not pertinant except as noted below, Social history, Allergies, and medications have been entered into the medical record, reviewed, and no changes needed.   Review of Systems: No fevers, chills, night sweats, weight loss, chest pain, or shortness of breath.   Objective:    General: Well Developed, well nourished, and in no acute distress.  Neuro: Alert and oriented x3, extra-ocular muscles intact, sensation grossly intact.  HEENT: Normocephalic, atraumatic, pupils equal round reactive to light, neck supple, no masses, no lymphadenopathy, thyroid nonpalpable.  Skin: Warm and dry, no rashes. Cardiac: Regular rate and rhythm, no murmurs rubs or gallops, no lower extremity edema.  Respiratory: Clear to auscultation bilaterally. Not using accessory muscles, speaking in full sentences.  Procedure: Real-time Ultrasound Guided Injection of left first interphalangeal joint of the hand Device: GE Logiq E  Verbal informed consent obtained.  Time-out conducted.  Noted no overlying erythema, induration, or other signs of local infection.  Skin prepped in a sterile fashion.  Local anesthesia: Topical Ethyl chloride.  With sterile technique and under real time ultrasound guidance:  0.5 mL kenalog 40, 0.5 mL lidocaine injected easily Completed without difficulty  Pain immediately resolved suggesting accurate placement of the medication.  Advised to call if fevers/chills, erythema, induration, drainage, or persistent bleeding.  Images  permanently stored and available for review in the ultrasound unit.  Impression: Technically successful ultrasound guided injection.  Procedure: Real-time Ultrasound Guided Injection of left second distal interphalangeal joint of the foot Device: GE Logiq E  Verbal informed consent obtained.  Time-out conducted.  Noted no overlying erythema, induration, or other signs of local infection.  Skin prepped in a sterile fashion.  Local anesthesia: Topical Ethyl chloride.  With sterile technique and under real time ultrasound guidance:  0.5 mL kenalog 40, 0.5 mL lidocaine injected easily Completed without difficulty  Pain immediately resolved suggesting accurate placement of the medication.  Advised to call if fevers/chills, erythema, induration, drainage, or persistent bleeding.  Images permanently stored and available for review in the ultrasound unit.  Impression: Technically successful ultrasound guided injection.  Impression and Recommendations:    I spent 40 minutes with this patient, greater than 50% was face-to-face time counseling regarding the above diagnoses, this time was separate from the time spent during the procedure

## 2015-01-04 NOTE — Assessment & Plan Note (Signed)
Fantastic response to previous injections.  at this point we are going to have to slow our injections and give it some time, I will inject his left first interphalangeal joint of the thumb as well as left second distal interphalangeal joint of the foot

## 2015-01-13 ENCOUNTER — Other Ambulatory Visit: Payer: Self-pay | Admitting: Internal Medicine

## 2015-03-07 ENCOUNTER — Encounter: Payer: Self-pay | Admitting: Sports Medicine

## 2015-03-07 ENCOUNTER — Ambulatory Visit (INDEPENDENT_AMBULATORY_CARE_PROVIDER_SITE_OTHER): Payer: 59 | Admitting: Sports Medicine

## 2015-03-07 DIAGNOSIS — M19041 Primary osteoarthritis, right hand: Secondary | ICD-10-CM

## 2015-03-07 DIAGNOSIS — M19042 Primary osteoarthritis, left hand: Secondary | ICD-10-CM

## 2015-03-07 NOTE — Assessment & Plan Note (Addendum)
Injections are placed today in the right and left fifth distal interphalangeal joints as well as the left first and second metatarsophalangeal joints of the foot. He does have early fatigability of his upper extremities, as well as with walking. On further questioning it sounds as though he did have an elevated CK level at an outside hospital. We are going to check CK levels, ESR, CRP, and other rheumatoid workup. Ultimately I am looking for a hereditary myopathy. He will get the blood work done after an episode of physical activity. If elevated we will proceed with muscle biopsy.

## 2015-03-07 NOTE — Addendum Note (Signed)
Addended by: Baird Kay on: 03/07/2015 11:22 AM   Modules accepted: Medications

## 2015-03-07 NOTE — Progress Notes (Signed)
Subjective:    CC: osteoarthritis of hands and feet  HPI: This is a pleasant 55 year old male, we have done injections into his interphalangeal joints, with a good response. Today he has pain that he localizes at the left and right fifth distal interphalangeal joints as well as the left and right first metatarsophalangeal joints of the feet. On further questioning he does get early fatigue of his upper extremities when playing the guitar, as well as aching and pain in his shoulder and hip girdle with physical activity. It sounds as though he did have an elevated CK level at an outside facility, that was not followed up on.  Past medical history, Surgical history, Family history not pertinant except as noted below, Social history, Allergies, and medications have been entered into the medical record, reviewed, and no changes needed.   Review of Systems: No fevers, chills, night sweats, weight loss, chest pain, or shortness of breath.   Objective:    General: Well Developed, well nourished, and in no acute distress.  Neuro: Alert and oriented x3, extra-ocular muscles intact, sensation grossly intact.  HEENT: Normocephalic, atraumatic, pupils equal round reactive to light, neck supple, no masses, no lymphadenopathy, thyroid nonpalpable.  Skin: Warm and dry, no rashes. Cardiac: Regular rate and rhythm, no murmurs rubs or gallops, no lower extremity edema.  Respiratory: Clear to auscultation bilaterally. Not using accessory muscles, speaking in full sentences.  Procedure: Real-time Ultrasound Guided Injection of left fifth DIP Device: GE Logiq E  Verbal informed consent obtained.  Time-out conducted.  Noted no overlying erythema, induration, or other signs of local infection.  Skin prepped in a sterile fashion.  Local anesthesia: Topical Ethyl chloride.  With sterile technique and under real time ultrasound guidance:  Using a 30-gauge needle and injected 1/2 mL Kenalog, 1/2 mL  lidocaine. Completed without difficulty  Pain immediately resolved suggesting accurate placement of the medication.  Advised to call if fevers/chills, erythema, induration, drainage, or persistent bleeding.  Images permanently stored and available for review in the ultrasound unit.  Impression: Technically successful ultrasound guided injection.  Procedure: Real-time Ultrasound Guided Injection of right fifth DIP Device: GE Logiq E  Verbal informed consent obtained.  Time-out conducted.  Noted no overlying erythema, induration, or other signs of local infection.  Skin prepped in a sterile fashion.  Local anesthesia: Topical Ethyl chloride.  With sterile technique and under real time ultrasound guidance:  Using a 30-gauge needle and injected 1/2 mL Kenalog, 1/2 mL lidocaine. Completed without difficulty  Pain immediately resolved suggesting accurate placement of the medication.  Advised to call if fevers/chills, erythema, induration, drainage, or persistent bleeding.  Images permanently stored and available for review in the ultrasound unit.  Impression: Technically successful ultrasound guided injection.  Procedure: Real-time Ultrasound Guided Injection of left first MTP Device: GE Logiq E  Verbal informed consent obtained.  Time-out conducted.  Noted no overlying erythema, induration, or other signs of local infection.  Skin prepped in a sterile fashion.  Local anesthesia: Topical Ethyl chloride.  With sterile technique and under real time ultrasound guidance:  Using a 30-gauge needle and injected 1/2 mL Kenalog, 1/2 mL lidocaine. Completed without difficulty  Pain immediately resolved suggesting accurate placement of the medication.  Advised to call if fevers/chills, erythema, induration, drainage, or persistent bleeding.  Images permanently stored and available for review in the ultrasound unit.  Impression: Technically successful ultrasound guided injection.  Procedure:  Real-time Ultrasound Guided Injection of right 1st MTP Device: GE Logiq E  Verbal informed consent obtained.  Time-out conducted.  Noted no overlying erythema, induration, or other signs of local infection.  Skin prepped in a sterile fashion.  Local anesthesia: Topical Ethyl chloride.  With sterile technique and under real time ultrasound guidance:  Using a 30-gauge needle and injected 1/2 mL Kenalog, 1/2 mL lidocaine. Completed without difficulty  Pain immediately resolved suggesting accurate placement of the medication.  Advised to call if fevers/chills, erythema, induration, drainage, or persistent bleeding.  Images permanently stored and available for review in the ultrasound unit.  Impression: Technically successful ultrasound guided injection.  Impression and Recommendations:

## 2015-04-13 LAB — CBC WITH DIFFERENTIAL/PLATELET
Basophils Absolute: 0 K/uL (ref 0.0–0.1)
Basophils Relative: 0 % (ref 0–1)
Eosinophils Absolute: 0.2 K/uL (ref 0.0–0.7)
Eosinophils Relative: 2 % (ref 0–5)
HCT: 44.3 % (ref 39.0–52.0)
Hemoglobin: 15 g/dL (ref 13.0–17.0)
Lymphocytes Relative: 36 % (ref 12–46)
Lymphs Abs: 3.5 10*3/uL (ref 0.7–4.0)
MCH: 31.8 pg (ref 26.0–34.0)
MCHC: 33.9 g/dL (ref 30.0–36.0)
MCV: 93.9 fL (ref 78.0–100.0)
MPV: 11.5 fL (ref 8.6–12.4)
Monocytes Absolute: 0.9 10*3/uL (ref 0.1–1.0)
Monocytes Relative: 9 % (ref 3–12)
Neutro Abs: 5.1 10*3/uL (ref 1.7–7.7)
Neutrophils Relative %: 53 % (ref 43–77)
Platelets: 240 K/uL (ref 150–400)
RBC: 4.72 MIL/uL (ref 4.22–5.81)
RDW: 13.5 % (ref 11.5–15.5)
WBC: 9.6 10*3/uL (ref 4.0–10.5)

## 2015-04-13 LAB — COMPREHENSIVE METABOLIC PANEL
ALT: 18 U/L (ref 9–46)
AST: 18 U/L (ref 10–35)
Albumin: 4.2 g/dL (ref 3.6–5.1)
Alkaline Phosphatase: 53 U/L (ref 40–115)
BUN: 21 mg/dL (ref 7–25)
Calcium: 9 mg/dL (ref 8.6–10.3)
Chloride: 105 mmol/L (ref 98–110)
Creat: 1.24 mg/dL (ref 0.70–1.33)
Potassium: 4.1 mmol/L (ref 3.5–5.3)
Sodium: 140 mmol/L (ref 135–146)
Total Bilirubin: 0.3 mg/dL (ref 0.2–1.2)
Total Protein: 6.7 g/dL (ref 6.1–8.1)

## 2015-04-13 LAB — SEDIMENTATION RATE: Sed Rate: 1 mm/hr (ref 0–20)

## 2015-04-13 LAB — COMPREHENSIVE METABOLIC PANEL WITH GFR
CO2: 28 mmol/L (ref 20–31)
Glucose, Bld: 128 mg/dL — ABNORMAL HIGH (ref 65–99)

## 2015-04-13 LAB — RHEUMATOID FACTOR: Rheumatoid fact SerPl-aCnc: 77 [IU]/mL — ABNORMAL HIGH (ref <=14)

## 2015-04-13 LAB — URIC ACID: Uric Acid, Serum: 4.5 mg/dL (ref 4.0–7.8)

## 2015-04-13 LAB — ANA: Anti Nuclear Antibody(ANA): NEGATIVE

## 2015-04-13 LAB — CYCLIC CITRUL PEPTIDE ANTIBODY, IGG: Cyclic Citrullin Peptide Ab: <16 Units

## 2015-04-13 LAB — CK: Total CK: 333 U/L — ABNORMAL HIGH (ref 7–232)

## 2015-04-13 LAB — C-REACTIVE PROTEIN: CRP: <0.5 mg/dL (ref <0.60)

## 2015-04-20 LAB — HLA-B27 ANTIGEN: DNA Result:: NEGATIVE

## 2015-06-06 ENCOUNTER — Other Ambulatory Visit: Payer: Self-pay

## 2015-06-06 ENCOUNTER — Telehealth: Payer: Self-pay

## 2015-06-06 DIAGNOSIS — M19042 Primary osteoarthritis, left hand: Principal | ICD-10-CM

## 2015-06-06 DIAGNOSIS — M19041 Primary osteoarthritis, right hand: Secondary | ICD-10-CM

## 2015-06-06 MED ORDER — ETODOLAC ER 600 MG PO TB24: 600.0000 mg | ORAL_TABLET | Freq: Every day | ORAL | Status: DC

## 2015-06-06 NOTE — Telephone Encounter (Signed)
Okay to refill with 3 month supply with refills.

## 2015-06-06 NOTE — Telephone Encounter (Signed)
Sent to pharmacy 

## 2015-06-09 ENCOUNTER — Ambulatory Visit: Payer: 59 | Admitting: Sports Medicine

## 2015-06-15 ENCOUNTER — Ambulatory Visit (INDEPENDENT_AMBULATORY_CARE_PROVIDER_SITE_OTHER): Payer: 59 | Admitting: Sports Medicine

## 2015-06-15 ENCOUNTER — Encounter: Payer: Self-pay | Admitting: Sports Medicine

## 2015-06-15 VITALS — BP 95/60 | HR 71 | Resp 18 | Wt 223.1 lb

## 2015-06-15 DIAGNOSIS — G729 Myopathy, unspecified: Secondary | ICD-10-CM | POA: Diagnosis not present

## 2015-06-15 MED ORDER — ETODOLAC ER 500 MG PO TB24: 500.0000 mg | ORAL_TABLET | Freq: Two times a day (BID) | ORAL | Status: DC

## 2015-06-15 NOTE — Progress Notes (Signed)
  Subjective:    CC: Follow-up  HPI: This is a pleasant 55 year old male, he has widespread myalgias, chronic pain syndrome, interestingly on several labs he has had an elevated CK level. We have him on max dose NSAIDs, with persistent pain, he did discuss his valproic acid with his psychiatrist, they suspected this is not the cause of his own and CK levels, at this point symptoms have been going on long enough, our diagnostic workup has concluded with the exception of doing a muscle biopsy.  Past medical history, Surgical history, Family history not pertinant except as noted below, Social history, Allergies, and medications have been entered into the medical record, reviewed, and no changes needed.   Review of Systems: No fevers, chills, night sweats, weight loss, chest pain, or shortness of breath.   Objective:    General: Well Developed, well nourished, and in no acute distress.  Neuro: Alert and oriented x3, extra-ocular muscles intact, sensation grossly intact.  HEENT: Normocephalic, atraumatic, pupils equal round reactive to light, neck supple, no masses, no lymphadenopathy, thyroid nonpalpable.  Skin: Warm and dry, no rashes. Cardiac: Regular rate and rhythm, no murmurs rubs or gallops, no lower extremity edema.  Respiratory: Clear to auscultation bilaterally. Not using accessory muscles, speaking in full sentences.  Impression and Recommendations:    I spent 25 minutes with this patient, greater than 50% was face-to-face time counseling regarding the above diagnoses

## 2015-06-15 NOTE — Assessment & Plan Note (Signed)
Continue etodolac, increasing to maximum dose. He has had several elevated CK levels with a negative rheumatologic workup, at this point I think we need to proceed with muscle biopsy. He also has some cramping in the legs, we are setting him up for an ABI.

## 2015-06-26 ENCOUNTER — Other Ambulatory Visit: Payer: Self-pay | Admitting: Sports Medicine

## 2015-06-26 DIAGNOSIS — I739 Peripheral vascular disease, unspecified: Secondary | ICD-10-CM

## 2015-06-29 ENCOUNTER — Inpatient Hospital Stay (HOSPITAL_COMMUNITY): Admission: RE | Admit: 2015-06-29 | Payer: 59 | Source: Ambulatory Visit

## 2015-07-07 ENCOUNTER — Telehealth: Payer: Self-pay | Admitting: Sports Medicine

## 2015-07-07 DIAGNOSIS — G729 Myopathy, unspecified: Secondary | ICD-10-CM

## 2015-07-07 NOTE — Telephone Encounter (Signed)
Pt went to [facility] for a muscle biopsy and they told them that he would have to be completely knocked out and not just a local anesthesia and they were not impressed and wants to know if you can send him somewhere else for a second opinion. Thanks.

## 2015-07-10 NOTE — Telephone Encounter (Signed)
New referral placed.

## 2015-07-25 ENCOUNTER — Ambulatory Visit (INDEPENDENT_AMBULATORY_CARE_PROVIDER_SITE_OTHER): Payer: 59 | Admitting: Sports Medicine

## 2015-07-25 ENCOUNTER — Encounter: Payer: Self-pay | Admitting: Sports Medicine

## 2015-07-25 VITALS — BP 130/81 | HR 90 | Resp 18 | Wt 229.3 lb

## 2015-07-25 DIAGNOSIS — M19041 Primary osteoarthritis, right hand: Secondary | ICD-10-CM

## 2015-07-25 DIAGNOSIS — M19042 Primary osteoarthritis, left hand: Secondary | ICD-10-CM | POA: Diagnosis not present

## 2015-07-25 NOTE — Assessment & Plan Note (Signed)
Left trapeziometacarpal joint injection as above. Return as needed.

## 2015-07-25 NOTE — Progress Notes (Signed)
  Subjective:    CC: Left hand pain  HPI: This is a pleasant 55 year old male with known widespread hand osteoarthritis, he is having a recurrence of pain at the trapeziometacarpal joint, desires that this be injected today. Pain is severe, worsening, no radiation.  Past medical history, Surgical history, Family history not pertinant except as noted below, Social history, Allergies, and medications have been entered into the medical record, reviewed, and no changes needed.   Review of Systems: No fevers, chills, night sweats, weight loss, chest pain, or shortness of breath.   Objective:    General: Well Developed, well nourished, and in no acute distress.  Neuro: Alert and oriented x3, extra-ocular muscles intact, sensation grossly intact.  HEENT: Normocephalic, atraumatic, pupils equal round reactive to light, neck supple, no masses, no lymphadenopathy, thyroid nonpalpable.  Skin: Warm and dry, no rashes. Cardiac: Regular rate and rhythm, no murmurs rubs or gallops, no lower extremity edema.  Respiratory: Clear to auscultation bilaterally. Not using accessory muscles, speaking in full sentences. Left hand: Tender to palpation at the base of the first metacarpal.  Procedure: Real-time Ultrasound Guided Injection of left trapeziometacarpal joint Device: GE Logiq E  Verbal informed consent obtained.  Time-out conducted.  Noted no overlying erythema, induration, or other signs of local infection.  Skin prepped in a sterile fashion.  Local anesthesia: Topical Ethyl chloride.  With sterile technique and under real time ultrasound guidance:  1/2 mL kenalog 40, 1/2 mL lidocaine injected easily. Completed without difficulty  Pain immediately resolved suggesting accurate placement of the medication.  Advised to call if fevers/chills, erythema, induration, drainage, or persistent bleeding.  Images permanently stored and available for review in the ultrasound unit.  Impression: Technically  successful ultrasound guided injection.  Impression and Recommendations:

## 2015-10-03 ENCOUNTER — Telehealth: Payer: Self-pay

## 2015-10-03 NOTE — Telephone Encounter (Signed)
We received an illegible fax with the report on it, please call his surgeon's office (Dr. Luster LandsbergBerger) and have them resend it more clearly.

## 2015-10-03 NOTE — Telephone Encounter (Signed)
Pt left VM stating he had his muscle biopsy done and would like to know the results. Please advise.

## 2015-10-04 NOTE — Telephone Encounter (Signed)
Pt notified of results. No questions or concerns but would like a copy of results. Copy made and put at the front desk.

## 2015-10-04 NOTE — Telephone Encounter (Signed)
Muscle biopsy is completely negative, it simply shows atrophy, there is no evidence of a congenital muscle disorder despite the elevated CK levels, he needs to exercise and workout more, the muscle does appear atrophied, and there is an element of fibromyalgia causing all of his symptoms.

## 2015-10-04 NOTE — Telephone Encounter (Signed)
Called and spoke with Darl PikesSusan at [place] office, awaiting fax.

## 2015-10-11 ENCOUNTER — Telehealth: Payer: Self-pay | Admitting: Internal Medicine

## 2015-10-11 NOTE — Telephone Encounter (Signed)
Received [facility] request for omeprazole. Called patient, because we have not established him with a new pcp. He was under the impression that he established with dr Okey Duprecrawford when he saw her back in nov 2016.   Checked with her, and she advised that this was not the case. Patient states that he is going to check with his wife and call us back. Advised that in the meantime we are unable fill rx until we get him in to be seen, or even scheduled.

## 2016-06-22 DIAGNOSIS — W57XXXA Bitten or stung by nonvenomous insect and other nonvenomous arthropods, initial encounter: Secondary | ICD-10-CM | POA: Diagnosis not present

## 2016-06-22 DIAGNOSIS — S20469A Insect bite (nonvenomous) of unspecified back wall of thorax, initial encounter: Secondary | ICD-10-CM | POA: Diagnosis not present

## 2016-06-22 DIAGNOSIS — Z23 Encounter for immunization: Secondary | ICD-10-CM | POA: Diagnosis not present

## 2016-06-23 ENCOUNTER — Other Ambulatory Visit: Payer: Self-pay | Admitting: Sports Medicine

## 2016-06-23 DIAGNOSIS — G729 Myopathy, unspecified: Secondary | ICD-10-CM

## 2016-06-26 ENCOUNTER — Ambulatory Visit: Payer: 59 | Admitting: Nurse Practitioner

## 2016-06-26 DIAGNOSIS — Z7689 Persons encountering health services in other specified circumstances: Secondary | ICD-10-CM | POA: Diagnosis not present

## 2016-06-26 DIAGNOSIS — W57XXXD Bitten or stung by nonvenomous insect and other nonvenomous arthropods, subsequent encounter: Secondary | ICD-10-CM | POA: Diagnosis not present

## 2016-06-26 DIAGNOSIS — Z119 Encounter for screening for infectious and parasitic diseases, unspecified: Secondary | ICD-10-CM | POA: Diagnosis not present

## 2016-07-08 ENCOUNTER — Ambulatory Visit (INDEPENDENT_AMBULATORY_CARE_PROVIDER_SITE_OTHER): Payer: 59 | Admitting: Sports Medicine

## 2016-07-08 VITALS — BP 124/78 | HR 56 | Wt 216.0 lb

## 2016-07-08 DIAGNOSIS — M19071 Primary osteoarthritis, right ankle and foot: Secondary | ICD-10-CM | POA: Insufficient documentation

## 2016-07-08 DIAGNOSIS — M19042 Primary osteoarthritis, left hand: Secondary | ICD-10-CM

## 2016-07-08 DIAGNOSIS — M19072 Primary osteoarthritis, left ankle and foot: Secondary | ICD-10-CM

## 2016-07-08 DIAGNOSIS — M19041 Primary osteoarthritis, right hand: Secondary | ICD-10-CM

## 2016-07-08 DIAGNOSIS — G729 Myopathy, unspecified: Secondary | ICD-10-CM

## 2016-07-08 MED ORDER — ETODOLAC ER 600 MG PO TB24: 600.0000 mg | ORAL_TABLET | Freq: Two times a day (BID) | ORAL | 3 refills | Status: DC

## 2016-07-08 NOTE — Assessment & Plan Note (Signed)
Left first interphalangeal and left fourth distal interphalangeal joint injections.

## 2016-07-08 NOTE — Assessment & Plan Note (Signed)
Left first MTP injection as above, return in one month.

## 2016-07-08 NOTE — Progress Notes (Signed)
Subjective:    I'm seeing this patient as a consultation for:  Dr. Hillard Danker  CC: Left foot pain  HPI: For months this pleasant 56 year old male with known hand osteoarthritis has had pain that he localizes in the left first MTP. Moderate, persistent, no trauma, moderate gelling, minimal swelling. He has taken etodolac without much improvement.  He also has pain in his hands, known osteoarthritis, over a year ago we did a trapeziometacarpal joint injection on the left, and this joint continues to be pain-free, he does have a recurrence of pain this time at the left first interphalangeal joint and the left fourth distal interphalangeal joint and desires interventional treatment into these joints as well.  Past medical history:  Negative.  See flowsheet/record as well for more information.  Surgical history: Negative.  See flowsheet/record as well for more information.  Family history: Negative.  See flowsheet/record as well for more information.  Social history: Negative.  See flowsheet/record as well for more information.  Allergies, and medications have been entered into the medical record, reviewed, and no changes needed.   Review of Systems: No headache, visual changes, nausea, vomiting, diarrhea, constipation, dizziness, abdominal pain, skin rash, fevers, chills, night sweats, weight loss, swollen lymph nodes, body aches, joint swelling, muscle aches, chest pain, shortness of breath, mood changes, visual or auditory hallucinations.   Objective:   General: Well Developed, well nourished, and in no acute distress.  Neuro/Psych: Alert and oriented x3, extra-ocular muscles intact, able to move all 4 extremities, sensation grossly intact. Skin: Warm and dry, no rashes noted.  Respiratory: Not using accessory muscles, speaking in full sentences, trachea midline.  Cardiovascular: Pulses palpable, no extremity edema. Abdomen: Does not appear distended. Left Foot: Visibly swollen  and tender left first MTP Range of motion is full in all directions. Strength is 5/5 in all directions. No hallux valgus. No pes cavus or pes planus. No abnormal callus noted. No pain over the navicular prominence, or base of fifth metatarsal. No tenderness to palpation of the calcaneal insertion of plantar fascia. No pain at the Achilles insertion. No pain over the calcaneal bursa. No pain of the retrocalcaneal bursa. No tenderness to palpation over the tarsals, metatarsals, or phalanges. No hallux rigidus or limitus. No tenderness palpation over interphalangeal joints. No pain with compression of the metatarsal heads. Neurovascularly intact distally. Left hand: Bouchard and Heberden nodes on all of the fingers, pain at the left first IP and the left fourth DIP.  Procedure: Real-time Ultrasound Guided Injection of left first MTP Device: GE Logiq E  Verbal informed consent obtained.  Time-out conducted.  Noted no overlying erythema, induration, or other signs of local infection.  Skin prepped in a sterile fashion.  Local anesthesia: Topical Ethyl chloride.  With sterile technique and under real time ultrasound guidance:  1/2 mL Kenalog 40, 1/2 mL lidocaine injected easily Completed without difficulty  Pain immediately resolved suggesting accurate placement of the medication.  Advised to call if fevers/chills, erythema, induration, drainage, or persistent bleeding.  Images permanently stored and available for review in the ultrasound unit.  Impression: Technically successful ultrasound guided injection.  Procedure: Real-time Ultrasound Guided Injection of left fourth DIP Device: GE Logiq E  Verbal informed consent obtained.  Time-out conducted.  Noted no overlying erythema, induration, or other signs of local infection.  Skin prepped in a sterile fashion.  Local anesthesia: Topical Ethyl chloride.  With sterile technique and under real time ultrasound guidance:  1/2 mL Kenalog  40,  1/2 mL lidocaine injected easily Completed without difficulty  Pain immediately resolved suggesting accurate placement of the medication.  Advised to call if fevers/chills, erythema, induration, drainage, or persistent bleeding.  Images permanently stored and available for review in the ultrasound unit.  Impression: Technically successful ultrasound guided injection.  Procedure: Real-time Ultrasound Guided Injection of left first interphalangeal joint Device: GE Logiq E  Verbal informed consent obtained.  Time-out conducted.  Noted no overlying erythema, induration, or other signs of local infection.  Skin prepped in a sterile fashion.  Local anesthesia: Topical Ethyl chloride.  With sterile technique and under real time ultrasound guidance:  1/2 mL Kenalog 40, 1/2 mL lidocaine injected easily Completed without difficulty  Pain immediately resolved suggesting accurate placement of the medication.  Advised to call if fevers/chills, erythema, induration, drainage, or persistent bleeding.  Images permanently stored and available for review in the ultrasound unit.  Impression: Technically successful ultrasound guided injection.  Impression and Recommendations:   This case required medical decision making of moderate complexity.  Primary osteoarthritis of both hands Left first interphalangeal and left fourth distal interphalangeal joint injections.   Osteoarthritis of first metatarsophalangeal (MTP) joint of left foot Left first MTP injection as above, return in one month.

## 2016-09-11 DIAGNOSIS — L249 Irritant contact dermatitis, unspecified cause: Secondary | ICD-10-CM | POA: Diagnosis not present

## 2016-09-11 DIAGNOSIS — M79605 Pain in left leg: Secondary | ICD-10-CM | POA: Diagnosis not present

## 2016-09-11 DIAGNOSIS — L738 Other specified follicular disorders: Secondary | ICD-10-CM | POA: Diagnosis not present

## 2016-09-13 ENCOUNTER — Encounter: Payer: Self-pay | Admitting: Family Medicine

## 2016-09-13 ENCOUNTER — Ambulatory Visit (INDEPENDENT_AMBULATORY_CARE_PROVIDER_SITE_OTHER): Payer: 59 | Admitting: Family Medicine

## 2016-09-13 ENCOUNTER — Other Ambulatory Visit (INDEPENDENT_AMBULATORY_CARE_PROVIDER_SITE_OTHER): Payer: 59

## 2016-09-13 VITALS — BP 140/80 | HR 70 | Temp 98.0°F | Ht 70.0 in | Wt 209.0 lb

## 2016-09-13 DIAGNOSIS — M79604 Pain in right leg: Secondary | ICD-10-CM | POA: Diagnosis not present

## 2016-09-13 DIAGNOSIS — W57XXXA Bitten or stung by nonvenomous insect and other nonvenomous arthropods, initial encounter: Secondary | ICD-10-CM

## 2016-09-13 DIAGNOSIS — M79605 Pain in left leg: Secondary | ICD-10-CM | POA: Insufficient documentation

## 2016-09-13 DIAGNOSIS — M5442 Lumbago with sciatica, left side: Secondary | ICD-10-CM

## 2016-09-13 LAB — URIC ACID: Uric Acid, Serum: 2.7 mg/dL — ABNORMAL LOW (ref 4.0–7.8)

## 2016-09-13 LAB — CK: Total CK: 247 U/L — ABNORMAL HIGH (ref 7–232)

## 2016-09-13 NOTE — Progress Notes (Signed)
Benjamin Warren - 56 y.o. male MRN [id number]  Date of birth: 1960-05-25  SUBJECTIVE:  Including CC & ROS.  Chief Complaint  Patient presents with  . Leg Pain    patient states last month left leg from hip to foot started hurting really bad. today pain lasted about four hours, now just the ankle is hurting    Benjamin Warren is a 56 yo M that is presenting with intermittent left leg pain that encompasses his whole leg. This started just recently. He has a long history of muscular complaints with a long rheumatology work up. He also has fibromyalgia, chronic pain and depression. He does not associate this pain with any certain movement or activity. It lasts for hours and resolves spontaneously. Take Etodolac for pain. Cannot take several medications due to intolerance. He had a muscle biopsy that was unrevealing.   Had two tick bites on his back. This occurred 4 months ago. The ticks were removed and he denies any rash associated with it.    I independently reviewed his MRI of his lumbar spine from 11/27/12 which showed no disc bulging and some mild facet arthritis.  Review of Systems  Constitutional: Negative for fever.  Musculoskeletal: Positive for arthralgias and myalgias. Negative for gait problem.  Skin: Negative for color change.  Neurological: Negative for weakness and numbness.  Hematological: Negative for adenopathy.    HISTORY: Past Medical, Surgical, Social, and Family History Reviewed & Updated per EMR.   Pertinent Historical Findings include:  Past Medical History:  Diagnosis Date  . Arthritis   . Chronic pain syndrome   . Depression   . Fibromyalgia   . GERD (gastroesophageal reflux disease)   . Left rotator cuff tear 10/09/2012  . Sleep apnea    had test-no dr told him he needed cpap  . TMJ syndrome     Past Surgical History:  Procedure Laterality Date  . ELBOW ARTHROPLASTY  1996   right  . HARDWARE REMOVAL  1997   rt wrist  . ORIF WRIST FRACTURE  1997   right  .  SHOULDER ARTHROSCOPY  1996   right  . SHOULDER ARTHROSCOPY WITH ROTATOR CUFF REPAIR AND SUBACROMIAL DECOMPRESSION Left 10/09/2012   Procedure: LEFT SHOULDER ARTHROSCOPY WITH SUBACROMIAL DECOMPRESSION, DISTAL CLAVICLE EXCISION DEBRIDEMENT AND ROTATOR CUFF REPAIR;  Surgeon: Eulas PostJoshua P Landau, MD;  Location: [facility];  Service: Orthopedics;  Laterality: Left;    No Known Allergies  Family History  Problem Relation Age of Onset  . Alcohol abuse Mother   . Alcohol abuse Other        FAMILY HISTORY  . Arthritis Other        FAMILY HISTORY  . Stroke Other      Social History   Social History  . Marital status: Married    Spouse name: N/A  . Number of children: N/A  . Years of education: N/A   Occupational History  . Not on file.   Social History Main Topics  . Smoking status: Former Smoker    Packs/day: 0.50    Years: 6.00    Types: Cigarettes    Quit date: 10/03/1982  . Smokeless tobacco: Never Used  . Alcohol use Yes     Comment: occ  . Drug use: Unknown  . Sexual activity: Not on file   Other Topics Concern  . Not on file   Social History Narrative  . No narrative on file     PHYSICAL EXAM:  VS:  BP 140/80 (BP Location: Left Arm, Patient Position: Sitting, Cuff Size: Normal)   Pulse 70   Temp 98 F (36.7 C) (Oral)   Ht 5\' 10"  (1.778 m)   Wt 209 lb (94.8 kg)   SpO2 97%   BMI 29.99 kg/m  Physical Exam Gen: NAD, alert, cooperative with exam,  ENT: normal lips, normal nasal mucosa,  Eye: normal EOM, normal conjunctiva and lids CV:  no edema, +2 pedal pulses   Resp: no accessory muscle use, non-labored,  Skin: no rashes, no areas of induration  Neuro: normal tone, normal sensation to touch Psych:  normal insight, alert and oriented MSK:  HIP/back:  No Ttp of lumbar spine, greater troch, SI joint  Normal IR and ER of the HIP  Normal hip flexion strength  Normal knee xtension and flexion  Normal gait  Normal DTR's b/l  Normal FABER and  FADIR  Neurovascularly intact        ASSESSMENT & PLAN:   I spent 25 minutes with this patient, greater than 50% was face-to-face time counseling regarding the below diagnosis.   Left leg pain Unclear of the source. He's had a long work up previously for muscle complaints which has not resulted a diagnosis. Had MRI of his lumbar spine in 2014. He's had a muscle biopsy  - CK and uric acid. CK has been elevated before  - continue current medications  - if no improvement could consider EMG     Bite, insect Tick bites about 4 months ago. These appear to be healing normally.

## 2016-09-13 NOTE — Patient Instructions (Addendum)
Thank you for coming in,   We will call you with the results from today.    Please feel free to call with any questions or concerns at any time, at [phone]. --Dr. Schmitz  

## 2016-09-15 DIAGNOSIS — W57XXXA Bitten or stung by nonvenomous insect and other nonvenomous arthropods, initial encounter: Secondary | ICD-10-CM | POA: Insufficient documentation

## 2016-09-15 NOTE — Assessment & Plan Note (Signed)
Unclear of the source. He's had a long work up previously for muscle complaints which has not resulted a diagnosis. Had MRI of his lumbar spine in 2014. He's had a muscle biopsy  - CK and uric acid. CK has been elevated before  - continue current medications  - if no improvement could consider EMG

## 2016-09-15 NOTE — Assessment & Plan Note (Signed)
Tick bites about 4 months ago. These appear to be healing normally.

## 2016-09-26 ENCOUNTER — Encounter: Payer: Self-pay | Admitting: Internal Medicine

## 2016-09-26 ENCOUNTER — Ambulatory Visit (INDEPENDENT_AMBULATORY_CARE_PROVIDER_SITE_OTHER): Payer: 59 | Admitting: Internal Medicine

## 2016-09-26 DIAGNOSIS — G4700 Insomnia, unspecified: Secondary | ICD-10-CM | POA: Diagnosis not present

## 2016-09-26 DIAGNOSIS — F331 Major depressive disorder, recurrent, moderate: Secondary | ICD-10-CM

## 2016-09-26 DIAGNOSIS — R21 Rash and other nonspecific skin eruption: Secondary | ICD-10-CM

## 2016-09-26 DIAGNOSIS — R413 Other amnesia: Secondary | ICD-10-CM | POA: Diagnosis not present

## 2016-09-26 DIAGNOSIS — M797 Fibromyalgia: Secondary | ICD-10-CM | POA: Diagnosis not present

## 2016-09-26 MED ORDER — HYDROCORTISONE 1 % EX OINT: 1.0000 "application " | TOPICAL_OINTMENT | Freq: Two times a day (BID) | CUTANEOUS | 0 refills | Status: DC

## 2016-09-26 MED ORDER — TRIAMCINOLONE 0.1 % CREAM:EUCERIN CREAM 1:1: 1.0000 "application " | TOPICAL_CREAM | Freq: Two times a day (BID) | CUTANEOUS | 0 refills | Status: DC | PRN

## 2016-09-26 MED ORDER — RAMELTEON 8 MG PO TABS: 8.0000 mg | ORAL_TABLET | Freq: Every day | ORAL | 2 refills | Status: DC

## 2016-09-26 MED ORDER — VORTIOXETINE HBR 10 MG PO TABS: 10.0000 mg | ORAL_TABLET | Freq: Every day | ORAL | 3 refills | Status: DC

## 2016-09-26 NOTE — Patient Instructions (Signed)
We are going to check the labs and also a CT of the head to look for causes of the memory change.   We have sent in trintellix to see if that helps with the mood and the fibromyalgia. It is 1 pill daily.   For sleep we have sent in ramelteon to take 1 pill at bedtime.   We have sent in the refill of the cream and also another cream to use for the itching.

## 2016-09-27 ENCOUNTER — Other Ambulatory Visit (INDEPENDENT_AMBULATORY_CARE_PROVIDER_SITE_OTHER): Payer: 59

## 2016-09-27 ENCOUNTER — Encounter: Payer: 59 | Admitting: Internal Medicine

## 2016-09-27 ENCOUNTER — Other Ambulatory Visit: Payer: Self-pay

## 2016-09-27 DIAGNOSIS — R21 Rash and other nonspecific skin eruption: Secondary | ICD-10-CM | POA: Insufficient documentation

## 2016-09-27 DIAGNOSIS — G4700 Insomnia, unspecified: Secondary | ICD-10-CM | POA: Insufficient documentation

## 2016-09-27 DIAGNOSIS — R413 Other amnesia: Secondary | ICD-10-CM | POA: Insufficient documentation

## 2016-09-27 LAB — CBC
HEMATOCRIT: 42 % (ref 39.0–52.0)
Hemoglobin: 14.1 g/dL (ref 13.0–17.0)
MCHC: 33.7 g/dL (ref 30.0–36.0)
MCV: 95.8 fl (ref 78.0–100.0)
Platelets: 262 10*3/uL (ref 150.0–400.0)
RBC: 4.39 Mil/uL (ref 4.22–5.81)
RDW: 12.6 % (ref 11.5–15.5)
WBC: 8.7 10*3/uL (ref 4.0–10.5)

## 2016-09-27 LAB — VITAMIN D 25 HYDROXY (VIT D DEFICIENCY, FRACTURES): VITD: 24.07 ng/mL — ABNORMAL LOW (ref 30.00–100.00)

## 2016-09-27 LAB — HEMOGLOBIN A1C: Hgb A1c MFr Bld: 5.6 % (ref 4.6–6.5)

## 2016-09-27 LAB — COMPREHENSIVE METABOLIC PANEL
ALBUMIN: 4.2 g/dL (ref 3.5–5.2)
ALT: 15 U/L (ref 0–53)
AST: 15 U/L (ref 0–37)
Alkaline Phosphatase: 49 U/L (ref 39–117)
BILIRUBIN TOTAL: 0.5 mg/dL (ref 0.2–1.2)
BUN: 27 mg/dL — ABNORMAL HIGH (ref 6–23)
CALCIUM: 8.9 mg/dL (ref 8.4–10.5)
CHLORIDE: 102 meq/L (ref 96–112)
CO2: 28 mEq/L (ref 19–32)
CREATININE: 1.33 mg/dL (ref 0.40–1.50)
GFR: 59.52 mL/min — AB (ref >60.00)
Glucose, Bld: 92 mg/dL (ref 70–99)
Potassium: 4.6 mEq/L (ref 3.5–5.1)
Sodium: 137 mEq/L (ref 135–145)
Total Protein: 6.8 g/dL (ref 6.0–8.3)

## 2016-09-27 LAB — LIPID PANEL
CHOLESTEROL: 154 mg/dL (ref 0–200)
HDL: 36.2 mg/dL — AB (ref >39.00)
LDL CALC: 89 mg/dL (ref 0–99)
NonHDL: 118.01
TRIGLYCERIDES: 143 mg/dL (ref 0.0–149.0)
Total CHOL/HDL Ratio: 4
VLDL: 28.6 mg/dL (ref 0.0–40.0)

## 2016-09-27 LAB — TESTOSTERONE: TESTOSTERONE: 270.82 ng/dL — AB (ref 300.00–890.00)

## 2016-09-27 LAB — VITAMIN B12: VITAMIN B 12: 553 pg/mL (ref 211–911)

## 2016-09-27 LAB — T4, FREE: FREE T4: 0.72 ng/dL (ref 0.60–1.60)

## 2016-09-27 LAB — TSH: TSH: 2.86 u[IU]/mL (ref 0.35–4.50)

## 2016-09-27 LAB — FERRITIN: Ferritin: 179.6 ng/mL (ref 22.0–322.0)

## 2016-09-27 MED ORDER — OMEPRAZOLE 40 MG PO CPDR: 40.0000 mg | DELAYED_RELEASE_CAPSULE | Freq: Every day | ORAL | 1 refills | Status: DC

## 2016-09-27 NOTE — Progress Notes (Signed)
   Subjective:    Patient ID: Benjamin Warren, male    DOB: July 29, 1960, 56 y.o.   MRN: [id number]  HPI The patient is a 56 YO man coming in for many concerns. He has not been seen for several years. First concern is his fibromyalgia (not treated with anything, causing daily pain and depression, seeing psych for depression and doing some better, has been bad for the last 5-6 years), and insomnia (is taking valium 20-30 mg at night time and not sleeping well, hard to get to sleep and waking up with pain), as well as some skin rash (has been using some old triamcinolone/eucerin cream, denies injury or rash) and memory loss (they are not sure if is it fibro fog, some depression, some alzheimer's in his family, some forgetting of short term memory with tasks or re-explaining things he just explained and forgetting that he already did that, poor sense of direction overall but not getting lost).   Review of Systems  Constitutional: Positive for activity change, appetite change and fatigue. Negative for chills, fever and unexpected weight change.  HENT: Negative.   Eyes: Negative.   Respiratory: Negative.   Cardiovascular: Negative.   Gastrointestinal: Negative.   Endocrine: Negative.   Musculoskeletal: Positive for arthralgias, joint swelling and myalgias. Negative for back pain and gait problem.  Skin: Negative.   Neurological: Positive for dizziness. Negative for tremors, weakness, light-headedness, numbness and headaches.  Psychiatric/Behavioral: Positive for decreased concentration, dysphoric mood and sleep disturbance. Negative for self-injury and suicidal ideas. The patient is nervous/anxious.       Objective:   Physical Exam  Constitutional: He is oriented to person, place, and time. He appears well-developed and well-nourished.  HENT:  Head: Normocephalic and atraumatic.  Right Ear: External ear normal.  Left Ear: External ear normal.  Eyes: EOM are normal.  Neck: Normal range of motion.  No JVD present.  Cardiovascular: Normal rate and regular rhythm.   Pulmonary/Chest: Effort normal and breath sounds normal. No respiratory distress. He has no wheezes. He has no rales.  Abdominal: Soft. Bowel sounds are normal. He exhibits no distension. There is no tenderness. There is no rebound.  Musculoskeletal: He exhibits tenderness. He exhibits no edema.  Lymphadenopathy:    He has no cervical adenopathy.  Neurological: He is alert and oriented to person, place, and time. Coordination normal.  Skin: Skin is warm and dry.  Psychiatric:  Some scattered history.    Vitals:   09/26/16 1508 09/26/16 1625  BP: (!) 160/90 (!) 142/80  Pulse: 83   Temp: 98.3 F (36.8 C)   TempSrc: Oral   SpO2: 99%   Weight: 207 lb (93.9 kg)   Height:  (1.778 m)       Assessment & Plan:

## 2016-09-27 NOTE — Assessment & Plan Note (Signed)
Rx for triamcinolone cream to use on the rash.

## 2016-09-27 NOTE — Assessment & Plan Note (Signed)
Unable to evaluate fully given numerous other complaints. Checking labs and CT head to rule out alternative cause. I suspect that his chronic pain and mood disorder is causing some of the memory changes and that better treatment would help resolve. Have encouraged him to go to counseling and also to return to his psychiatrist.

## 2016-09-27 NOTE — Assessment & Plan Note (Signed)
Seeing psych and have asked that they return as not well controlled. He is going to start some counseling.

## 2016-09-27 NOTE — Assessment & Plan Note (Signed)
Taking valium for sleep and cannot be increased. Will add ramelteon. Per their reports has tried and failed belsomra.
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
## 2016-10-02 ENCOUNTER — Telehealth: Payer: Self-pay | Admitting: Internal Medicine

## 2016-10-02 NOTE — Telephone Encounter (Signed)
Pt called stating that he was seen recently by Dr. Okey Dupre but wanted to know if she could send in a prescription for Valacyclovir to [facility] in [place]. He said that he gets bad cold sores and this is the only thing that helps.

## 2016-10-03 ENCOUNTER — Ambulatory Visit (INDEPENDENT_AMBULATORY_CARE_PROVIDER_SITE_OTHER)
Admission: RE | Admit: 2016-10-03 | Discharge: 2016-10-03 | Disposition: A | Discharge status: Home / Self Care | Payer: 59 | Source: Ambulatory Visit | Attending: Internal Medicine | Admitting: Internal Medicine

## 2016-10-03 DIAGNOSIS — R413 Other amnesia: Secondary | ICD-10-CM | POA: Diagnosis not present

## 2016-10-03 MED ORDER — VALACYCLOVIR HCL 1 G PO TABS: 1000.0000 mg | ORAL_TABLET | Freq: Two times a day (BID) | ORAL | 0 refills | Status: DC

## 2016-10-03 NOTE — Telephone Encounter (Signed)
Contacted patient and informed them Rx was sent

## 2016-10-03 NOTE — Telephone Encounter (Signed)
Sent in rx for him.

## 2016-10-04 ENCOUNTER — Inpatient Hospital Stay: Admission: RE | Admit: 2016-10-04 | Payer: 59 | Source: Ambulatory Visit

## 2016-10-07 ENCOUNTER — Other Ambulatory Visit: Payer: Self-pay

## 2016-10-07 MED ORDER — VALACYCLOVIR HCL 1 G PO TABS: 1000.0000 mg | ORAL_TABLET | Freq: Two times a day (BID) | ORAL | 0 refills | Status: DC

## 2016-10-09 ENCOUNTER — Other Ambulatory Visit: Payer: Self-pay | Admitting: Internal Medicine

## 2016-10-09 ENCOUNTER — Telehealth: Payer: Self-pay | Admitting: Internal Medicine

## 2016-10-09 DIAGNOSIS — R413 Other amnesia: Secondary | ICD-10-CM

## 2016-10-09 NOTE — Telephone Encounter (Signed)
Patient called and informed

## 2016-10-09 NOTE — Telephone Encounter (Signed)
Patient's wife called back regarding this today

## 2016-10-09 NOTE — Telephone Encounter (Signed)
Contacted patient and his wife and they want to know if he is going to be sent to a neuropsychologist for early dementia testing and if it is with in their insurance for [facility]. Wife is also concerned that his voice is always shaky when he talks. Patient wants to know if he can be checked out for tremors.

## 2016-10-09 NOTE — Telephone Encounter (Signed)
Have placed the order for the testing for memory so they will get a call back about that.

## 2016-10-14 ENCOUNTER — Encounter: Payer: Self-pay | Admitting: Internal Medicine

## 2016-10-14 ENCOUNTER — Ambulatory Visit: Payer: 59 | Admitting: Internal Medicine

## 2016-10-14 ENCOUNTER — Ambulatory Visit (INDEPENDENT_AMBULATORY_CARE_PROVIDER_SITE_OTHER): Payer: 59 | Admitting: Internal Medicine

## 2016-10-14 ENCOUNTER — Telehealth: Payer: Self-pay

## 2016-10-14 DIAGNOSIS — R413 Other amnesia: Secondary | ICD-10-CM | POA: Diagnosis not present

## 2016-10-14 DIAGNOSIS — R251 Tremor, unspecified: Secondary | ICD-10-CM | POA: Diagnosis not present

## 2016-10-14 DIAGNOSIS — M797 Fibromyalgia: Secondary | ICD-10-CM | POA: Diagnosis not present

## 2016-10-14 NOTE — Telephone Encounter (Signed)
Called and informed patient that they are going to need to get a hold of insurance or call around to psych places to see what insurance covers or if psych places accept their insurance.

## 2016-10-16 DIAGNOSIS — R251 Tremor, unspecified: Secondary | ICD-10-CM | POA: Insufficient documentation

## 2016-10-16 NOTE — Assessment & Plan Note (Signed)
This can be a medication side effect. Advised that it is always better with mood medications to call first to their psychiatrist so they can safely stop the medication and not to change dosing on their own. They will return to psychiatry. Tremor in hands minimal on exam and tremoring in the face is not observed although his wife was seeing it during the visit. Possible minimal muscle twitching which may or may not have a cause.

## 2016-10-16 NOTE — Assessment & Plan Note (Signed)
Neuropsych testing ordered as he does have poorly controlled mood disorder. Advised them to return to psychiatry for medication adjustment.

## 2016-10-16 NOTE — Progress Notes (Signed)
   Subjective:    Patient ID: Benjamin Warren, male    DOB: Oct 29, 1960, 56 y.o.   MRN: [id number]  HPI The patient is a 56 YO man coming in for several concerns including recent head CT (they want to go over the results as they did not understand, concerned about the advanced loss for age, still having some short term memory loss, slightly better since they have stopped taking his lamictal as recommended, they are taking 1 pill daily instead of the 3 advised by his psychiatrist), as well as his fibromyalgia (they have started taking the trintellix about 3-4 days ago and not having any side effects yet, no benefit yet), and some concerns about tremoring (his wife has noticed more than he has, some tremoring of his hands which now is resolving with the decreasing dose of lamictal, some twitching in the face which is minimal and he does not notice at all but his wife has noticed).   Review of Systems  Constitutional: Positive for activity change, appetite change and fatigue. Negative for chills, fever and unexpected weight change.  HENT: Negative.   Eyes: Negative.   Respiratory: Negative for cough, chest tightness and shortness of breath.   Cardiovascular: Negative for chest pain, palpitations and leg swelling.  Gastrointestinal: Negative for abdominal distention, abdominal pain, constipation, diarrhea, nausea and vomiting.  Musculoskeletal: Positive for arthralgias, back pain, gait problem, joint swelling and myalgias.  Skin: Negative.   Neurological: Positive for weakness and headaches.  Psychiatric/Behavioral: Positive for decreased concentration, dysphoric mood and sleep disturbance. The patient is nervous/anxious.       Objective:   Physical Exam  Constitutional: He is oriented to person, place, and time. He appears well-developed and well-nourished.  HENT:  Head: Normocephalic and atraumatic.  Eyes: EOM are normal.  Neck: Normal range of motion.  Cardiovascular: Normal rate and regular  rhythm.   Pulmonary/Chest: Effort normal and breath sounds normal. No respiratory distress. He has no wheezes. He has no rales.  Abdominal: Soft. Bowel sounds are normal. He exhibits no distension. There is no tenderness. There is no rebound.  Musculoskeletal: He exhibits no edema.  Neurological: He is alert and oriented to person, place, and time. Coordination normal.  Skin: Skin is warm and dry.  Psychiatric: He has a normal mood and affect.   Vitals:   10/14/16 1414 10/14/16 1453  BP: (!) 150/92 140/88  Pulse: 67   Temp: 98.2 F (36.8 C)   TempSrc: Oral   SpO2: 100%   Weight: 206 lb (93.4 kg)   Height:  (1.778 m)       Assessment & Plan:

## 2016-10-16 NOTE — Assessment & Plan Note (Signed)
Advised that trintellix does take longer to work than we have given it yet. They will continue for now.

## 2016-10-22 ENCOUNTER — Other Ambulatory Visit: Payer: Self-pay | Admitting: Internal Medicine

## 2016-10-22 DIAGNOSIS — R413 Other amnesia: Secondary | ICD-10-CM

## 2016-11-01 ENCOUNTER — Encounter: Payer: Self-pay | Admitting: Psychology

## 2016-11-12 ENCOUNTER — Telehealth: Payer: Self-pay | Admitting: Internal Medicine

## 2016-11-12 NOTE — Telephone Encounter (Signed)
Fine with me

## 2016-11-12 NOTE — Telephone Encounter (Signed)
Pt scheduled to see Dr Jonny RuizJohn to transfer care.

## 2016-11-12 NOTE — Telephone Encounter (Signed)
Pt called requesting to transfer care from Dr. Okey Duprerawford to Dr. Jonny RuizJohn. He said that he would feel more comfortable seeing a male physician.  Dr. Okey Duprerawford, is this okay with you? Dr. Jonny RuizJohn, is this okay with you?

## 2016-11-12 NOTE — Telephone Encounter (Signed)
Ok with nme

## 2016-11-15 DIAGNOSIS — Z23 Encounter for immunization: Secondary | ICD-10-CM | POA: Diagnosis not present

## 2016-11-21 ENCOUNTER — Encounter: Payer: Self-pay | Admitting: Internal Medicine

## 2016-11-21 ENCOUNTER — Other Ambulatory Visit (INDEPENDENT_AMBULATORY_CARE_PROVIDER_SITE_OTHER): Payer: 59

## 2016-11-21 ENCOUNTER — Ambulatory Visit: Payer: 59 | Admitting: Internal Medicine

## 2016-11-21 VITALS — BP 134/82 | HR 70 | Temp 98.1°F | Ht 70.0 in | Wt 208.0 lb

## 2016-11-21 DIAGNOSIS — G8929 Other chronic pain: Secondary | ICD-10-CM | POA: Insufficient documentation

## 2016-11-21 DIAGNOSIS — E291 Testicular hypofunction: Secondary | ICD-10-CM

## 2016-11-21 DIAGNOSIS — R51 Headache: Secondary | ICD-10-CM

## 2016-11-21 DIAGNOSIS — Z0000 Encounter for general adult medical examination without abnormal findings: Secondary | ICD-10-CM

## 2016-11-21 DIAGNOSIS — B001 Herpesviral vesicular dermatitis: Secondary | ICD-10-CM

## 2016-11-21 DIAGNOSIS — F419 Anxiety disorder, unspecified: Secondary | ICD-10-CM

## 2016-11-21 DIAGNOSIS — E559 Vitamin D deficiency, unspecified: Secondary | ICD-10-CM

## 2016-11-21 DIAGNOSIS — M2669 Other specified disorders of temporomandibular joint: Secondary | ICD-10-CM

## 2016-11-21 HISTORY — DX: Vitamin D deficiency, unspecified: E55.9

## 2016-11-21 HISTORY — DX: Anxiety disorder, unspecified: F41.9

## 2016-11-21 HISTORY — DX: Other specified disorders of temporomandibular joint: M26.69

## 2016-11-21 HISTORY — DX: Testicular hypofunction: E29.1

## 2016-11-21 HISTORY — DX: Herpesviral vesicular dermatitis: B00.1

## 2016-11-21 LAB — PSA: PSA: 1.66 ng/mL (ref 0.10–4.00)

## 2016-11-21 NOTE — Progress Notes (Signed)
Subjective:    Patient ID: Glade NurseSteven D Vine, male    DOB: [birth year], 56 y.o.   MRN: [id number]  HPI  Here for wellness and f/u;  Overall doing ok;  Pt denies Chest pain, worsening SOB, DOE, wheezing, orthopnea, PND, worsening LE edema, palpitations, dizziness or syncope.  Pt denies neurological change such as new headache, facial or extremity weakness.  Pt denies polydipsia, polyuria, or low sugar symptoms. Pt states overall good compliance with treatment and medications, good tolerability, and has been trying to follow appropriate diet.  Pt denies worsening depressive symptoms, suicidal ideation or panic. No fever, night sweats, wt loss, loss of appetite, or other constitutional symptoms.  Pt states good ability with ADL's, has low fall risk, home safety reviewed and adequate, no other significant changes in hearing or vision, and only occasionally active with exercise.  Other interval hx includes:  Has tried cymbalta and mult other meds for depression and FMS including lyrica.   Has had colonoscopy with polyp removal and bx of ? Ileocecal area mass but all neg for malignancy.  Off depakote due to ? increased tremors. CT head neg sept 2018.  Never started the Trintellix from sept 2018 due to cost.  Is using an oral appliance per dental and OSA symptoms improved, proven with ONO normal as well.   Past Medical History:  Diagnosis Date  . Anxiety 11/21/2016  . Arthritis   . Chronic pain syndrome   . Crepitus of left TMJ on opening of jaw 11/21/2016  . Depression   . Fever blister 11/21/2016  . Fibromyalgia   . GERD (gastroesophageal reflux disease)   . Left rotator cuff tear 10/09/2012  . 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 Sleep apnea    had test-no dr told him he needed cpap  . TMJ syndrome   . Vitamin D deficiency 11/21/2016   Past Surgical History:  Procedure Laterality Date  . ELBOW ARTHROPLASTY  1996   right  . HARDWARE REMOVAL  1997   rt wrist  . ORIF WRIST FRACTURE  1997   right  .  SHOULDER ARTHROSCOPY  1996   right    reports that he quit smoking about 34 years ago. His smoking use included cigarettes. He has a 3.00 pack-year smoking history. he has never used smokeless tobacco. He reports that he drinks alcohol. His drug history is not on file. family history includes Alcohol abuse in his mother and other; Arthritis in his other; Stroke in his other. No Known Allergies Current Outpatient Medications on File Prior to Visit  Medication Sig Dispense Refill  . cetirizine (ZYRTEC) 10 MG tablet TAKE 1 TABLET BY MOUTH DAILY. 30 tablet 7  . diazepam (VALIUM) 10 MG tablet Take 20 mg by mouth.    . etodolac (LODINE XL) 600 MG 24 hr tablet Take 1 tablet (600 mg total) by mouth 2 (two) times daily. 60 tablet 3  . fluticasone (FLONASE) 50 MCG/ACT nasal spray Place 2 sprays into both nostrils daily. 16 g 5  . hydrocortisone 1 % ointment Apply 1 application topically 2 (two) times daily. 30 g 0  . lamoTRIgine (LAMICTAL) 100 MG tablet Take by mouth.    . Misc. Devices MISC Oral Sleep Device to be used every night during sleep. 1 each 0  . omeprazole (PRILOSEC) 40 MG capsule Take 1 capsule (40 mg total) by mouth daily. 90 capsule 1  . Triamcinolone Acetonide (TRIAMCINOLONE 0.1 % CREAM : EUCERIN) CREA Apply 1 application topically 2 (two) times  daily as needed. 1 each 0  . valACYclovir (VALTREX) 1000 MG tablet Take 1 tablet (1,000 mg total) by mouth 2 (two) times daily. 20 tablet 0   No current facility-administered medications on file prior to visit.    Review of Systems Constitutional: Negative for other unusual diaphoresis, sweats, appetite or weight changes HENT: Negative for other worsening hearing loss, ear pain, facial swelling, mouth sores or neck stiffness.   Eyes: Negative for other worsening pain, redness or other visual disturbance.  Respiratory: Negative for other stridor or swelling Cardiovascular: Negative for other palpitations or other chest pain  Gastrointestinal:  Negative for worsening diarrhea or loose stools, blood in stool, distention or other pain Genitourinary: Negative for hematuria, flank pain or other change in urine volume.  Musculoskeletal: Negative for myalgias or other joint swelling.  Skin: Negative for other color change, or other wound or worsening drainage.  Neurological: Negative for other syncope or numbness. Hematological: Negative for other adenopathy or swelling Psychiatric/Behavioral: Negative for hallucinations, other worsening agitation, SI, self-injury, or new decreased concentration All other system neg per pt    Objective:   Physical Exam BP 134/82   Pulse 70   Temp 98.1 F (36.7 C) (Oral)   Ht 5\' 10"  (1.778 m)   Wt 208 lb (94.3 kg)   SpO2 98%   BMI 29.84 kg/m  VS noted,  Constitutional: Pt is oriented to person, place, and time. Appears well-developed and well-nourished, in no significant distress and comfortable Head: Normocephalic and atraumatic  Eyes: Conjunctivae and EOM are normal. Pupils are equal, round, and reactive to light Right Ear: External ear normal without discharge Left Ear: External ear normal without discharge Nose: Nose without discharge or deformity Mouth/Throat: Oropharynx is without other ulcerations and moist  Neck: Normal range of motion. Neck supple. No JVD present. No tracheal deviation present or significant neck LA or mass Cardiovascular: Normal rate, regular rhythm, normal heart sounds and intact distal pulses.   Pulmonary/Chest: WOB normal and breath sounds without rales or wheezing  Abdominal: Soft. Bowel sounds are normal. NT. No HSM  Musculoskeletal: Normal range of motion. Exhibits no edema Lymphadenopathy: Has no other cervical adenopathy.  Neurological: Pt is alert and oriented to person, place, and time. Pt has normal reflexes. No cranial nerve deficit. Motor grossly intact, Gait intact Skin: Skin is warm and dry. No rash noted or new ulcerations Psychiatric:  Has normal mood  and affect. Behavior is normal without agitation No other exam findings Lab Results  Component Value Date   WBC 8.7 09/27/2016   HGB 14.1 09/27/2016   HCT 42.0 09/27/2016   PLT 262.0 09/27/2016   GLUCOSE 92 09/27/2016   CHOL 154 09/27/2016   TRIG 143.0 09/27/2016   HDL 36.20 (L) 09/27/2016   LDLDIRECT 123.0 11/25/2014   LDLCALC 89 09/27/2016   ALT 15 09/27/2016   AST 15 09/27/2016   NA 137 09/27/2016   K 4.6 09/27/2016   CL 102 09/27/2016   CREATININE 1.33 09/27/2016   BUN 27 (H) 09/27/2016   CO2 28 09/27/2016   TSH 2.86 09/27/2016   PSA 1.66 11/21/2016   HGBA1C 5.6 09/27/2016       Assessment & Plan:

## 2016-11-21 NOTE — Patient Instructions (Signed)

## 2016-11-21 NOTE — Assessment & Plan Note (Signed)

## 2016-12-03 ENCOUNTER — Other Ambulatory Visit: Payer: Self-pay | Admitting: Sports Medicine

## 2016-12-03 DIAGNOSIS — G729 Myopathy, unspecified: Secondary | ICD-10-CM

## 2016-12-30 ENCOUNTER — Ambulatory Visit (INDEPENDENT_AMBULATORY_CARE_PROVIDER_SITE_OTHER): Payer: 59

## 2016-12-30 ENCOUNTER — Encounter: Payer: Self-pay | Admitting: Sports Medicine

## 2016-12-30 ENCOUNTER — Ambulatory Visit: Payer: 59 | Admitting: Sports Medicine

## 2016-12-30 ENCOUNTER — Ambulatory Visit: Payer: 59 | Admitting: Family Medicine

## 2016-12-30 DIAGNOSIS — S62522A Displaced fracture of distal phalanx of left thumb, initial encounter for closed fracture: Secondary | ICD-10-CM

## 2016-12-30 DIAGNOSIS — M1990 Unspecified osteoarthritis, unspecified site: Secondary | ICD-10-CM | POA: Diagnosis not present

## 2016-12-30 DIAGNOSIS — S02609A Fracture of mandible, unspecified, initial encounter for closed fracture: Secondary | ICD-10-CM | POA: Insufficient documentation

## 2016-12-30 DIAGNOSIS — S0990XA Unspecified injury of head, initial encounter: Secondary | ICD-10-CM | POA: Diagnosis not present

## 2016-12-30 DIAGNOSIS — S02652A Fracture of angle of left mandible, initial encounter for closed fracture: Secondary | ICD-10-CM

## 2016-12-30 DIAGNOSIS — W19XXXA Unspecified fall, initial encounter: Secondary | ICD-10-CM

## 2016-12-30 DIAGNOSIS — J9811 Atelectasis: Secondary | ICD-10-CM | POA: Diagnosis not present

## 2016-12-30 DIAGNOSIS — S62242A Displaced fracture of shaft of first metacarpal bone, left hand, initial encounter for closed fracture: Secondary | ICD-10-CM | POA: Diagnosis not present

## 2016-12-30 DIAGNOSIS — S0081XD Abrasion of other part of head, subsequent encounter: Secondary | ICD-10-CM | POA: Diagnosis not present

## 2016-12-30 DIAGNOSIS — S098XXA Other specified injuries of head, initial encounter: Secondary | ICD-10-CM | POA: Diagnosis not present

## 2016-12-30 DIAGNOSIS — S199XXA Unspecified injury of neck, initial encounter: Secondary | ICD-10-CM | POA: Diagnosis not present

## 2016-12-30 DIAGNOSIS — R55 Syncope and collapse: Secondary | ICD-10-CM | POA: Diagnosis not present

## 2016-12-30 DIAGNOSIS — R531 Weakness: Secondary | ICD-10-CM | POA: Diagnosis not present

## 2016-12-30 MED ORDER — HYDROCODONE-ACETAMINOPHEN 10-325 MG PO TABS: 1.0000 | ORAL_TABLET | Freq: Three times a day (TID) | ORAL | 0 refills | Status: DC | PRN

## 2016-12-30 NOTE — Assessment & Plan Note (Signed)
Mild head trauma, no concussive symptoms, pain and swelling at the left first interphalangeal joint of the hand, as well as left side of the jaw. 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 X-rays of the left thumb, left jaw, and he will return to see me for further evaluation.

## 2016-12-30 NOTE — Progress Notes (Signed)
   Subjective:    I'm seeing this patient as a consultation for: Dr. Hillard DankerElizabeth Crawford  CC: Fall  HPI: This is a pleasant 56 year old male, he recently had a fall, fell against a wall, impacting his left thumb, and his left jaw.  He has severe swelling in his left face, he also has some pain and swelling of the left first interphalangeal joint.  Denies any loss of consciousness, no dizziness, fogginess and after the injury.  Past medical history, Surgical history, Family history not pertinant except as noted below, Social history, Allergies, and medications have been entered into the medical record, reviewed, and no changes needed.   (To billers/coders, pertinent past medical, social, surgical, family history can be found in problem list, if problem list is marked as reviewed then this indicates that past medical, social, surgical, family history was also reviewed)  Review of Systems: No headache, visual changes, nausea, vomiting, diarrhea, constipation, dizziness, abdominal pain, skin rash, fevers, chills, night sweats, weight loss, swollen lymph nodes, body aches, joint swelling, muscle aches, chest pain, shortness of breath, mood changes, visual or auditory hallucinations.   Objective:   General: Well Developed, well nourished, and in no acute distress.  Neuro:  Extra-ocular muscles intact, able to move all 4 extremities, sensation grossly intact.  Deep tendon reflexes tested were normal. Psych: Alert and oriented, mood congruent with affect. ENT:  Ears and nose appear unremarkable.  Hearing grossly normal. Neck: Unremarkable overall appearance, trachea midline.  No visible thyroid enlargement. Eyes: Conjunctivae and lids appear unremarkable.  Pupils equal and round. Skin: Warm and dry, no rashes noted.  Cardiovascular: Pulses palpable, no extremity edema. Left hand: Swollen and tender DIP and distal phalanx of the thumb. Head: Normocephalic, visible trauma, swelling over the left  cheek.  There is tenderness to palpation over the left angle of the mandible both inside the mouth as well as outside.  There is no clinical malocclusion.  X-rays personally reviewed, there is a transverse, minimally displaced fracture through the left first distal phalanx.  He also has a nondisplaced fracture through the angle of the left mandible.  Staxx splint placed on the left thumb.  Impression and Recommendations:   This case required medical decision making of moderate complexity.  Fall Mild head trauma, no concussive symptoms, pain and swelling at the left first interphalangeal joint of the hand, as well as left side of the jaw. 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 X-rays of the left thumb, left jaw, and he will return to see me for further evaluation.  Closed fracture of distal phalanx of left thumb Minimally displaced transverse fracture, Staxx splint placed. Return in 1 month.  I billed a fracture code for this encounter, all subsequent visits will be post-op checks in the global period.  Fracture of mandible, closed (HCC) There is a nondisplaced closed fracture at the angle of the left mandible. He will continue with his mouthpiece for now. Clinically he has no malocclusion. Hydrocodone for pain.  I billed a fracture code for this encounter, all subsequent visits will be post-op checks in the global period.  ___________________________________________ Ihor Austinhomas J. Benjamin Stainhekkekandam, M.D., ABFM., CAQSM. Primary Care and Sports Medicine [facility]  Adjunct Instructor of Family Medicine  [employer]

## 2016-12-30 NOTE — Assessment & Plan Note (Signed)
Minimally displaced transverse fracture, Staxx splint placed. Return in 1 month.  I billed a fracture code for this encounter, all subsequent visits will be post-op checks in the global period.

## 2016-12-30 NOTE — Assessment & Plan Note (Addendum)
There is a nondisplaced closed fracture at the angle of the left mandible. He will continue with his mouthpiece for now. Clinically he has no malocclusion. Hydrocodone for pain.  I billed a fracture code for this encounter, all subsequent visits will be post-op checks in the global period.

## 2017-02-01 ENCOUNTER — Other Ambulatory Visit: Payer: Self-pay | Admitting: Sports Medicine

## 2017-02-01 DIAGNOSIS — G729 Myopathy, unspecified: Secondary | ICD-10-CM

## 2017-03-04 ENCOUNTER — Ambulatory Visit: Payer: 59 | Admitting: Sports Medicine

## 2017-03-18 ENCOUNTER — Encounter: Payer: Self-pay | Admitting: Sports Medicine

## 2017-03-18 ENCOUNTER — Ambulatory Visit: Payer: 59 | Admitting: Sports Medicine

## 2017-03-18 DIAGNOSIS — L603 Nail dystrophy: Secondary | ICD-10-CM | POA: Insufficient documentation

## 2017-03-18 DIAGNOSIS — M1812 Unilateral primary osteoarthritis of first carpometacarpal joint, left hand: Secondary | ICD-10-CM | POA: Diagnosis not present

## 2017-03-18 DIAGNOSIS — S62522D Displaced fracture of distal phalanx of left thumb, subsequent encounter for fracture with routine healing: Secondary | ICD-10-CM

## 2017-03-18 DIAGNOSIS — M180 Bilateral primary osteoarthritis of first carpometacarpal joints: Secondary | ICD-10-CM | POA: Insufficient documentation

## 2017-03-18 DIAGNOSIS — S02652D Fracture of angle of left mandible, subsequent encounter for fracture with routine healing: Secondary | ICD-10-CM

## 2017-03-18 NOTE — Assessment & Plan Note (Signed)
Completely resolved. Did well with the mouthpiece. Never had any malocclusion.
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
## 2017-03-18 NOTE — Assessment & Plan Note (Signed)
Injection as above, return as needed for this.

## 2017-03-18 NOTE — Progress Notes (Signed)
Subjective:    CC: Follow-up  HPI: Mandibular fracture: Resolved with mouthpiece, pain medicine and the tincture of time.  Left thumb distal phalangeal fracture: Resolved.  Left thumb pain: Localized at the thumb basal joint, moderate, persistent without radiation.  Oral analgesics, NSAIDs have been ineffective.  Toenail issue: Right-sided, split nail for many years.  Tends to catch on socks, slightly painful.  I reviewed the past medical history, family history, social history, surgical history, and allergies today and no changes were needed.  Please see the problem list section below in epic for further details.  Past Medical History: Past Medical History:  Diagnosis Date  . Anxiety 11/21/2016  . Arthritis   . Chronic pain syndrome   . Crepitus of left TMJ on opening of jaw 11/21/2016  . Depression   . Fever blister 11/21/2016  . Fibromyalgia   . GERD (gastroesophageal reflux disease)   . Left rotator cuff tear 10/09/2012  . 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 Sleep apnea    had test-no dr told him he needed cpap  . TMJ syndrome   . Vitamin D deficiency 11/21/2016   Past Surgical History: Past Surgical History:  Procedure Laterality Date  . ELBOW ARTHROPLASTY  1996   right  . HARDWARE REMOVAL  1997   rt wrist  . ORIF WRIST FRACTURE  1997   right  . SHOULDER ARTHROSCOPY  1996   right  . SHOULDER ARTHROSCOPY WITH ROTATOR CUFF REPAIR AND SUBACROMIAL DECOMPRESSION Left 10/09/2012   Procedure: LEFT SHOULDER ARTHROSCOPY WITH SUBACROMIAL DECOMPRESSION, DISTAL CLAVICLE EXCISION DEBRIDEMENT AND ROTATOR CUFF REPAIR;  Surgeon: Eulas Post, MD;  Location: [facility];  Service: Orthopedics;  Laterality: Left;   Social History: Social History   Socioeconomic History  . Marital status: Married    Spouse name: None  . Number of children: None  . Years of education: None  . Highest education level: None  Social Needs  . Financial resource strain: None  . Food  insecurity - worry: None  . Food insecurity - inability: None  . Transportation needs - medical: None  . Transportation needs - non-medical: None  Occupational History  . None  Tobacco Use  . Smoking status: Former Smoker    Packs/day: 0.50    Years: 6.00    Pack years: 3.00    Types: Cigarettes    Last attempt to quit: 10/03/1982    Years since quitting: 34.4  . Smokeless tobacco: Never Used  Substance and Sexual Activity  . Alcohol use: Yes    Comment: occ  . Drug use: None  . Sexual activity: None  Other Topics Concern  . None  Social History Narrative  . None   Family History: Family History  Problem Relation Age of Onset  . Alcohol abuse Mother   . Alcohol abuse Other        FAMILY HISTORY  . Arthritis Other        FAMILY HISTORY  . Stroke Other    Allergies: No Known Allergies Medications: See med rec.  Review of Systems: No fevers, chills, night sweats, weight loss, chest pain, or shortness of breath.   Objective:    General: Well Developed, well nourished, and in no acute distress.  Neuro: Alert and oriented x3, extra-ocular muscles intact, sensation grossly intact.  HEENT: Normocephalic, atraumatic, pupils equal round reactive to light, neck supple, no masses, no lymphadenopathy, thyroid nonpalpable.  Skin: Warm and dry, no rashes. Cardiac: Regular rate and rhythm, no murmurs  rubs or gallops, no lower extremity edema.  Respiratory: Clear to auscultation bilaterally. Not using accessory muscles, speaking in full sentences. Left hand: Tender to palpation of the thumb basal joint.  Mild onychodystrophy on the left thumb nail, appears to be growing out appropriately. Right foot: Split toenail, great toe.  Procedure: Real-time Ultrasound Guided Injection of left thumb basal joint Device: GE Logiq E  Verbal informed consent obtained.  Time-out conducted.  Noted no overlying erythema, induration, or other signs of local infection.  Skin prepped in a sterile  fashion.  Local anesthesia: Topical Ethyl chloride.  With sterile technique and under real time ultrasound guidance: 1/2 cc Kenalog 40, 1/2 cc lidocaine injected easily. Completed without difficulty  Pain immediately resolved suggesting accurate placement of the medication.  Advised to call if fevers/chills, erythema, induration, drainage, or persistent bleeding.  Images permanently stored and available for review in the ultrasound unit.  Impression: Technically successful ultrasound guided injection.  Impression and Recommendations:    Primary osteoarthritis of first carpometacarpal joint of left hand Injection as above, return as needed for this.  Fracture of mandible, closed (HCC) Completely resolved. Did well with the mouthpiece. Never had any malocclusion.  Closed fracture of distal phalanx of left thumb Clinically resolved, he does have some lines in his nail that will grow out.  Longitudinal split nail, right great toenail Likely due to remote trauma, referral to podiatry. ___________________________________________ Ihor Austinhomas J. Benjamin Stainhekkekandam, M.D., ABFM., CAQSM. Primary Care and Sports Medicine [facility]  Adjunct Instructor of Family Medicine  [employer]

## 2017-03-18 NOTE — Assessment & Plan Note (Signed)
Clinically resolved, he does have some lines in his nail that will grow out.

## 2017-03-27 ENCOUNTER — Ambulatory Visit: Payer: Self-pay | Admitting: Podiatry

## 2017-03-31 ENCOUNTER — Ambulatory Visit: Payer: Self-pay | Admitting: Podiatry

## 2017-04-15 ENCOUNTER — Telehealth: Payer: Self-pay | Admitting: Psychology

## 2017-04-15 ENCOUNTER — Ambulatory Visit: Payer: 59 | Admitting: Psychology

## 2017-04-15 ENCOUNTER — Encounter: Payer: Self-pay | Admitting: Psychology

## 2017-04-15 DIAGNOSIS — R413 Other amnesia: Secondary | ICD-10-CM
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
## 2017-04-15 NOTE — Progress Notes (Signed)
   Neuropsychology Note  Glade NurseSteven D Brisk completed 150 minutes of neuropsychological testing with technician, Wallace Kellerana Chamberlain, BS, under the supervision of Dr. Elvis CoilMaryBeth Bailar, Licensed Psychologist. The patient did not appear overtly distressed by the testing session, per behavioral observation or via self-report to the technician. Rest breaks were offered.   Clinical Decision Making: In considering the patient's current level of functioning, level of presumed impairment, nature of symptoms, emotional and behavioral responses during the interview, level of literacy, and observed level of motivation/effort, a battery of tests was selected and communicated to the psychometrician.  Communication between the psychologist and technician was ongoing throughout the testing session and changes were made as deemed necessary based on patient performance on testing, technician observations and additional pertinent factors such as those listed above.  Glade NurseSteven D Sobek will return within approximately 2 weeks for an interactive feedback session with Dr. Alinda DoomsBailar at which time his test performances, clinical impressions and treatment recommendations will be reviewed in detail. The patient understands he can contact our office should he require our assistance before this time.  35 minutes spent performing neuropsychological evaluation services/clinical decision making (psychologist). [CPT 96132] 150 minutes spent face-to-face with patient administering standardized tests, 60 minutes spent scoring (technician). [CPT [id number], 96139]  Full report to follow.

## 2017-04-15 NOTE — Telephone Encounter (Signed)
Unfortunately I don't have any openings either of those days.

## 2017-04-15 NOTE — Telephone Encounter (Signed)
Hi Dr. Alinda DoomsBailar  Patient is here today to see you for the Interview and Annabelle Harmanana for testing. He is scheduled for a follow up for results on 04/29/17. His wife is asking if there is any way he can get his results on 4/11 or 4/15?She said they may be out of town the day he is scheduled.  Please Advise.  Thanks WalgreenBrandy
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
## 2017-04-21 ENCOUNTER — Other Ambulatory Visit: Payer: Self-pay | Admitting: Internal Medicine

## 2017-04-22 ENCOUNTER — Ambulatory Visit (INDEPENDENT_AMBULATORY_CARE_PROVIDER_SITE_OTHER): Payer: 59 | Admitting: Psychology

## 2017-04-22 ENCOUNTER — Encounter: Payer: Self-pay | Admitting: Psychology

## 2017-04-22 DIAGNOSIS — F331 Major depressive disorder, recurrent, moderate: Secondary | ICD-10-CM

## 2017-04-22 DIAGNOSIS — R413 Other amnesia: Secondary | ICD-10-CM | POA: Diagnosis not present

## 2017-04-22 NOTE — Patient Instructions (Signed)
  Evaluation results show NO evidence of developing dementia or Alzheimer's disease.   Depression, chronic pain and medications can all slow mental processing speed, which can affect the efficiency of other cognitive functions in daily life.  You may benefit from seeing a therapist well versed in cognitive behavioral therapy (CBT) for chronic pain. I understand the trauma work you did in the past was not helpful, so focusing on chronic pain may be a better route for you.   You will need to continue seeing your psychiatrist for medication management as well. Mood should continue to be monitored.   You should keep a regular routine/daily schedule in order to help with mood and behavioral activation especially given that you don't have the daily routine of a job. You may wish to consider volunteer opportunities that would also boost mood and help give sense of purpose.  If cognitive decline is reported or observed in the future, the current test results can serve as a baseline for comparison.     The effect of depression and anxiety on your cognitive functioning: . One of the typical symptoms of depression is difficulty concentrating and making decisions, and various types of anxiety also interfere with attention and concentration . Problems with attention and concentration can disrupt the process of learning and making new memories, which can make it seem like there is a problem with your memory. In your daily life, you may experience this disruption as forgetting names and appointments, misplacing items, and needing to make lists for shopping and errands. It may be harder for you to stay focused on tasks and feel as "sharp" as you did in the past.  . Also, when we are depressed or anxious, we often pay more attention to our difficulties (rather than our strengths) in our daily life, and this can make it seem to us like we are doing worse cognitively than we really are. . The cognitive aspects of  depression and anxiety are sometimes observed as an identifiable pattern of poor performance on a neuropsychological evaluation, but it is also possible that all scores on an evaluation are within normal limits. . Regardless of the test scores, distress related to depression and anxiety can interfere with the ability to make use of your cognitive resources and function optimally across settings such as work or school, maintaining the home and responsibilities, and personal relationships. . Fortunately, there are treatments for depression and anxiety, and when mood improves, cognitive functioning in daily life often improves. . Treatment options include psychotherapy, medications (e.g., antidepressants), and behavioral changes, such as increasing your involvement in enjoyable activities, increasing the amount of exercise you are getting, and maintaining a regular routine.

## 2017-04-29 ENCOUNTER — Encounter: Payer: 59 | Admitting: Psychology

## 2017-05-12 ENCOUNTER — Ambulatory Visit: Payer: Self-pay | Admitting: Sports Medicine

## 2017-05-13 ENCOUNTER — Encounter: Payer: Self-pay | Admitting: Sports Medicine

## 2017-05-13 ENCOUNTER — Ambulatory Visit: Payer: 59 | Admitting: Sports Medicine

## 2017-05-13 DIAGNOSIS — M19072 Primary osteoarthritis, left ankle and foot: Secondary | ICD-10-CM | POA: Diagnosis not present

## 2017-05-13 DIAGNOSIS — M19041 Primary osteoarthritis, right hand: Secondary | ICD-10-CM

## 2017-05-13 DIAGNOSIS — M19042 Primary osteoarthritis, left hand: Secondary | ICD-10-CM

## 2017-05-13 NOTE — Progress Notes (Signed)
Subjective:    CC: Follow-up  HPI: Bilateral first MTP osteoarthritis, previous injection was almost a year ago, desires repeat interventional treatment today, pain is moderate, persistent, localized to the first MTPs bilaterally without radiation.  Left hand pain: This time at the left fourth DIP, pain is moderate, persistent.  Desires interventional treatment today.  I reviewed the past medical history, family history, social history, surgical history, and allergies today and no changes were needed.  Please see the problem list section below in epic for further details.  Past Medical History: Past Medical History:  Diagnosis Date  . Anxiety 11/21/2016  . Arthritis   . Chronic pain syndrome   . Crepitus of left TMJ on opening of jaw 11/21/2016  . Depression   . Fever blister 11/21/2016  . Fibromyalgia   . GERD (gastroesophageal reflux disease)   . Left rotator cuff tear 10/09/2012  . 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 Sleep apnea    had test-no dr told him he needed cpap  . TMJ syndrome   . Vitamin D deficiency 11/21/2016   Past Surgical History: Past Surgical History:  Procedure Laterality Date  . ELBOW ARTHROPLASTY  1996   right  . HARDWARE REMOVAL  1997   rt wrist  . ORIF WRIST FRACTURE  1997   right  . SHOULDER ARTHROSCOPY  1996   right  . SHOULDER ARTHROSCOPY WITH ROTATOR CUFF REPAIR AND SUBACROMIAL DECOMPRESSION Left 10/09/2012   Procedure: LEFT SHOULDER ARTHROSCOPY WITH SUBACROMIAL DECOMPRESSION, DISTAL CLAVICLE EXCISION DEBRIDEMENT AND ROTATOR CUFF REPAIR;  Surgeon: Eulas Post, MD;  Location: [facility];  Service: Orthopedics;  Laterality: Left;   Social History: Social History   Socioeconomic History  . Marital status: Married    Spouse name: Not on file  . Number of children: Not on file  . Years of education: Not on file  . Highest education level: Not on file  Occupational History  . Not on file  Social Needs  . Financial resource  strain: Not on file  . Food insecurity:    Worry: Not on file    Inability: Not on file  . Transportation needs:    Medical: Not on file    Non-medical: Not on file  Tobacco Use  . Smoking status: Former Smoker    Packs/day: 0.50    Years: 6.00    Pack years: 3.00    Types: Cigarettes    Last attempt to quit: 10/03/1982    Years since quitting: 34.6  . Smokeless tobacco: Never Used  Substance and Sexual Activity  . Alcohol use: Yes    Comment: occ  . Drug use: Not on file  . Sexual activity: Not on file  Lifestyle  . Physical activity:    Days per week: Not on file    Minutes per session: Not on file  . Stress: Not on file  Relationships  . Social connections:    Talks on phone: Not on file    Gets together: Not on file    Attends religious service: Not on file    Active member of club or organization: Not on file    Attends meetings of clubs or organizations: Not on file    Relationship status: Not on file  Other Topics Concern  . Not on file  Social History Narrative  . Not on file   Family History: Family History  Problem Relation Age of Onset  . Alcohol abuse Mother   . Alcohol abuse Other  FAMILY HISTORY  . Arthritis Other        FAMILY HISTORY  . Stroke Other    Allergies: No Known Allergies Medications: See med rec.  Review of Systems: No fevers, chills, night sweats, weight loss, chest pain, or shortness of breath.   Objective:    General: Well Developed, well nourished, and in no acute distress.  Neuro: Alert and oriented x3, extra-ocular muscles intact, sensation grossly intact.  HEENT: Normocephalic, atraumatic, pupils equal round reactive to light, neck supple, no masses, no lymphadenopathy, thyroid nonpalpable.  Skin: Warm and dry, no rashes. Cardiac: Regular rate and rhythm, no murmurs rubs or gallops, no lower extremity edema.  Respiratory: Clear to auscultation bilaterally. Not using accessory muscles, speaking in full  sentences.  Procedure: Real-time Ultrasound Guided Injection of left first MTP Device: GE Logiq E  Verbal informed consent obtained.  Time-out conducted.  Noted no overlying erythema, induration, or other signs of local infection.  Skin prepped in a sterile fashion.  Local anesthesia: Topical Ethyl chloride.  With sterile technique and under real time ultrasound guidance: 1/2 cc kenalog 40, 1/2 cc lidocaine injected easily Completed without difficulty  Pain immediately resolved suggesting accurate placement of the medication.  Advised to call if fevers/chills, erythema, induration, drainage, or persistent bleeding.  Images permanently stored and available for review in the ultrasound unit.  Impression: Technically successful ultrasound guided injection.  Procedure: Real-time Ultrasound Guided Injection of right first MTP Device: GE Logiq E  Verbal informed consent obtained.  Time-out conducted.  Noted no overlying erythema, induration, or other signs of local infection.  Skin prepped in a sterile fashion.  Local anesthesia: Topical Ethyl chloride.  With sterile technique and under real time ultrasound guidance: 1/2 cc kenalog 40, 1/2 cc lidocaine injected easily Completed without difficulty  Pain immediately resolved suggesting accurate placement of the medication.  Advised to call if fevers/chills, erythema, induration, drainage, or persistent bleeding.  Images permanently stored and available for review in the ultrasound unit.  Impression: Technically successful ultrasound guided injection.  Procedure: Real-time Ultrasound Guided Injection of left fourth DIP Device: GE Logiq E  Verbal informed consent obtained.  Time-out conducted.  Noted no overlying erythema, induration, or other signs of local infection.  Skin prepped in a sterile fashion.  Local anesthesia: Topical Ethyl chloride.  With sterile technique and under real time ultrasound guidance: 1/2 cc kenalog 40, 1/2 cc  lidocaine injected easily Completed without difficulty  Pain immediately resolved suggesting accurate placement of the medication.  Advised to call if fevers/chills, erythema, induration, drainage, or persistent bleeding.  Images permanently stored and available for review in the ultrasound unit.  Impression: Technically successful ultrasound guided injection.  Impression and Recommendations:    Osteoarthritis of first metatarsophalangeal (MTP) joint of left foot Left and right first MTP injections. Previous injection was approximately 10 months ago.  Primary osteoarthritis of both hands Left fourth DIP injection as above. ___________________________________________ [name]. Benjamin Stain, M.D., ABFM., CAQSM. Primary Care and Sports Medicine [facility]  Adjunct Instructor of Family Medicine  [employer]

## 2017-05-13 NOTE — Assessment & Plan Note (Signed)
Left fourth DIP injection as above.

## 2017-05-13 NOTE — Assessment & Plan Note (Signed)
Left and right first MTP injections. Previous injection was approximately 10 months ago.

## 2017-06-05 ENCOUNTER — Encounter: Payer: Self-pay | Admitting: Psychology

## 2017-06-17 ENCOUNTER — Ambulatory Visit: Payer: 59 | Admitting: Psychology

## 2017-06-17 ENCOUNTER — Encounter: Payer: Self-pay | Admitting: Psychology

## 2017-06-24 ENCOUNTER — Ambulatory Visit (INDEPENDENT_AMBULATORY_CARE_PROVIDER_SITE_OTHER): Payer: 59 | Admitting: Psychology

## 2017-06-24 DIAGNOSIS — F330 Major depressive disorder, recurrent, mild: Secondary | ICD-10-CM | POA: Diagnosis not present

## 2017-07-08 ENCOUNTER — Other Ambulatory Visit: Payer: Self-pay | Admitting: Sports Medicine

## 2017-07-08 ENCOUNTER — Ambulatory Visit (INDEPENDENT_AMBULATORY_CARE_PROVIDER_SITE_OTHER): Payer: 59 | Admitting: Psychology

## 2017-07-08 ENCOUNTER — Ambulatory Visit: Payer: 59 | Admitting: Sports Medicine

## 2017-07-08 ENCOUNTER — Encounter: Payer: Self-pay | Admitting: Sports Medicine

## 2017-07-08 DIAGNOSIS — F330 Major depressive disorder, recurrent, mild: Secondary | ICD-10-CM | POA: Diagnosis not present

## 2017-07-08 DIAGNOSIS — M7061 Trochanteric bursitis, right hip: Secondary | ICD-10-CM | POA: Diagnosis not present

## 2017-07-08 DIAGNOSIS — G729 Myopathy, unspecified: Secondary | ICD-10-CM

## 2017-07-08 MED ORDER — PREDNISONE 50 MG PO TABS: ORAL_TABLET | ORAL | 0 refills | Status: DC

## 2017-07-08 NOTE — Progress Notes (Signed)
Subjective:    I'm seeing this patient as a consultation for: Dr. Hillard Danker  CC: Right hip pain  HPI: For the past several days this pleasant 57 year old male has had pain that he localizes on the lateral aspect of his right hip, moderate, persistent, localized with radiation down to the mid thigh but not past the knee.  Worse laying on the ipsilateral side.  His wife gave him some leftover prednisone, approximately 20 mg that provided good relief for a day.  They do have a trip coming up tomorrow.  I reviewed the past medical history, family history, social history, surgical history, and allergies today and no changes were needed.  Please see the problem list section below in epic for further details.  Past Medical History: Past Medical History:  Diagnosis Date  . Anxiety 11/21/2016  . Arthritis   . Chronic pain syndrome   . Crepitus of left TMJ on opening of jaw 11/21/2016  . Depression   . Fever blister 11/21/2016  . Fibromyalgia   . GERD (gastroesophageal reflux disease)   . Left rotator cuff tear 10/09/2012  . 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 Sleep apnea    had test-no dr told him he needed cpap  . TMJ syndrome   . Vitamin D deficiency 11/21/2016   Past Surgical History: Past Surgical History:  Procedure Laterality Date  . ELBOW ARTHROPLASTY  1996   right  . HARDWARE REMOVAL  1997   rt wrist  . ORIF WRIST FRACTURE  1997   right  . SHOULDER ARTHROSCOPY  1996   right  . SHOULDER ARTHROSCOPY WITH ROTATOR CUFF REPAIR AND SUBACROMIAL DECOMPRESSION Left 10/09/2012   Procedure: LEFT SHOULDER ARTHROSCOPY WITH SUBACROMIAL DECOMPRESSION, DISTAL CLAVICLE EXCISION DEBRIDEMENT AND ROTATOR CUFF REPAIR;  Surgeon: Eulas Post, MD;  Location: [facility];  Service: Orthopedics;  Laterality: Left;   Social History: Social History   Socioeconomic History  . Marital status: Married    Spouse name: Not on file  . Number of children: Not on file  . Years of  education: Not on file  . Highest education level: Not on file  Occupational History  . Not on file  Social Needs  . Financial resource strain: Not on file  . Food insecurity:    Worry: Not on file    Inability: Not on file  . Transportation needs:    Medical: Not on file    Non-medical: Not on file  Tobacco Use  . Smoking status: Former Smoker    Packs/day: 0.50    Years: 6.00    Pack years: 3.00    Types: Cigarettes    Last attempt to quit: 10/03/1982    Years since quitting: 34.7  . Smokeless tobacco: Never Used  Substance and Sexual Activity  . Alcohol use: Yes    Comment: occ  . Drug use: Not on file  . Sexual activity: Not on file  Lifestyle  . Physical activity:    Days per week: Not on file    Minutes per session: Not on file  . Stress: Not on file  Relationships  . Social connections:    Talks on phone: Not on file    Gets together: Not on file    Attends religious service: Not on file    Active member of club or organization: Not on file    Attends meetings of clubs or organizations: Not on file    Relationship status: Not on file  Other Topics  Concern  . Not on file  Social History Narrative  . Not on file   Family History: Family History  Problem Relation Age of Onset  . Alcohol abuse Mother   . Alcohol abuse Other        FAMILY HISTORY  . Arthritis Other        FAMILY HISTORY  . Stroke Other    Allergies: No Known Allergies Medications: See med rec.  Review of Systems: No headache, visual changes, nausea, vomiting, diarrhea, constipation, dizziness, abdominal pain, skin rash, fevers, chills, night sweats, weight loss, swollen lymph nodes, body aches, joint swelling, muscle aches, chest pain, shortness of breath, mood changes, visual or auditory hallucinations.   Objective:   General: Well Developed, well nourished, and in no acute distress.  Neuro:  Extra-ocular muscles intact, able to move all 4 extremities, sensation grossly intact.  Deep  tendon reflexes tested were normal. Psych: Alert and oriented, mood congruent with affect. ENT:  Ears and nose appear unremarkable.  Hearing grossly normal. Neck: Unremarkable overall appearance, trachea midline.  No visible thyroid enlargement. Eyes: Conjunctivae and lids appear unremarkable.  Pupils equal and round. Skin: Warm and dry, no rashes noted.  Cardiovascular: Pulses palpable, no extremity edema. Right hip: ROM IR: 60 Deg, ER: 60 Deg, Flexion: 120 Deg, Extension: 100 Deg, Abduction: 45 Deg, Adduction: 45 Deg Strength IR: 5/5, ER: 5/5, Flexion: 5/5, Extension: 5/5, Abduction: 4-/5, Adduction: 5/5 Pelvic alignment unremarkable to inspection and palpation. Standing hip rotation and gait without trendelenburg / unsteadiness. Greater trochanter with tenderness to palpation. No tenderness over piriformis. No SI joint tenderness and normal minimal SI movement.  Impression and Recommendations:   This case required medical decision making of moderate complexity.  Trochanteric bursitis, right hip 5 days of prednisone, rehab exercises given, return in a month. ___________________________________________ Ihor Austinhomas J. Benjamin Stainhekkekandam, M.D., ABFM., CAQSM. Primary Care and Sports Medicine [facility]  Adjunct Instructor of Family Medicine  [employer]

## 2017-07-08 NOTE — Assessment & Plan Note (Addendum)
5 days of prednisone, rehab exercises given, return in a month.

## 2017-07-09 ENCOUNTER — Telehealth: Payer: Self-pay

## 2017-07-09 NOTE — Telephone Encounter (Signed)
Pt called- saw Dr T yesterday and was started on prednisone but wanted to know if he should come in today and get an injection.  Per Dr T., pt needs to complete course of prednisone and then we can discuss if injection is needed.   Pt advised and will take prednisone

## 2017-07-16 ENCOUNTER — Encounter: Payer: Self-pay | Admitting: Sports Medicine

## 2017-07-16 ENCOUNTER — Ambulatory Visit: Payer: 59 | Admitting: Sports Medicine

## 2017-07-16 DIAGNOSIS — M7061 Trochanteric bursitis, right hip: Secondary | ICD-10-CM | POA: Diagnosis not present

## 2017-07-16 MED ORDER — PREDNISONE 10 MG (48) PO TBPK: ORAL_TABLET | Freq: Every day | ORAL | 0 refills | Status: DC

## 2017-07-16 NOTE — Progress Notes (Signed)
Subjective:    CC: Hip pain  HPI: This is a pleasant 57 year old male, we treated him a week ago for trochanteric bursitis with oral prednisone, he did well, after the prednisone stopped, we did a 5-day burst he felt fairly severe fatigue and tiredness.  His hip pain was improved considerably but still has some moderate pain over the lateral greater trochanter, worse with laying on the ipsilateral side, no radiation.  Has noted some increasing urination recently as well.  I reviewed the past medical history, family history, social history, surgical history, and allergies today and no changes were needed.  Please see the problem list section below in epic for further details.  Past Medical History: Past Medical History:  Diagnosis Date  . Anxiety 11/21/2016  . Arthritis   . Chronic pain syndrome   . Crepitus of left TMJ on opening of jaw 11/21/2016  . Depression   . Fever blister 11/21/2016  . Fibromyalgia   . GERD (gastroesophageal reflux disease)   . Left rotator cuff tear 10/09/2012  . 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 Sleep apnea    had test-no dr told him he needed cpap  . TMJ syndrome   . Vitamin D deficiency 11/21/2016   Past Surgical History: Past Surgical History:  Procedure Laterality Date  . ELBOW ARTHROPLASTY  1996   right  . HARDWARE REMOVAL  1997   rt wrist  . ORIF WRIST FRACTURE  1997   right  . SHOULDER ARTHROSCOPY  1996   right  . SHOULDER ARTHROSCOPY WITH ROTATOR CUFF REPAIR AND SUBACROMIAL DECOMPRESSION Left 10/09/2012   Procedure: LEFT SHOULDER ARTHROSCOPY WITH SUBACROMIAL DECOMPRESSION, DISTAL CLAVICLE EXCISION DEBRIDEMENT AND ROTATOR CUFF REPAIR;  Surgeon: Eulas Post, MD;  Location: [facility];  Service: Orthopedics;  Laterality: Left;   Social History: Social History   Socioeconomic History  . Marital status: Married    Spouse name: Not on file  . Number of children: Not on file  . Years of education: Not on file  . Highest education  level: Not on file  Occupational History  . Not on file  Social Needs  . Financial resource strain: Not on file  . Food insecurity:    Worry: Not on file    Inability: Not on file  . Transportation needs:    Medical: Not on file    Non-medical: Not on file  Tobacco Use  . Smoking status: Former Smoker    Packs/day: 0.50    Years: 6.00    Pack years: 3.00    Types: Cigarettes    Last attempt to quit: 10/03/1982    Years since quitting: 34.8  . Smokeless tobacco: Never Used  Substance and Sexual Activity  . Alcohol use: Yes    Comment: occ  . Drug use: Not on file  . Sexual activity: Not on file  Lifestyle  . Physical activity:    Days per week: Not on file    Minutes per session: Not on file  . Stress: Not on file  Relationships  . Social connections:    Talks on phone: Not on file    Gets together: Not on file    Attends religious service: Not on file    Active member of club or organization: Not on file    Attends meetings of clubs or organizations: Not on file    Relationship status: Not on file  Other Topics Concern  . Not on file  Social History Narrative  . Not  on file   Family History: Family History  Problem Relation Age of Onset  . Alcohol abuse Mother   . Alcohol abuse Other        FAMILY HISTORY  . Arthritis Other        FAMILY HISTORY  . Stroke Other    Allergies: No Known Allergies Medications: See med rec.  Review of Systems: No fevers, chills, night sweats, weight loss, chest pain, or shortness of breath.   Objective:    General: Well Developed, well nourished, and in no acute distress.  Neuro: Alert and oriented x3, extra-ocular muscles intact, sensation grossly intact.  HEENT: Normocephalic, atraumatic, pupils equal round reactive to light, neck supple, no masses, no lymphadenopathy, thyroid nonpalpable.  Skin: Warm and dry, no rashes. Cardiac: Regular rate and rhythm, no murmurs rubs or gallops, no lower extremity edema.  Respiratory:  Clear to auscultation bilaterally. Not using accessory muscles, speaking in full sentences. Right hip: ROM IR: 60 Deg, ER: 60 Deg, Flexion: 120 Deg, Extension: 100 Deg, Abduction: 45 Deg, Adduction: 45 Deg Strength IR: 5/5, ER: 5/5, Flexion: 5/5, Extension: 5/5, Abduction: 5/5, Adduction: 5/5 Pelvic alignment unremarkable to inspection and palpation. Standing hip rotation and gait without trendelenburg / unsteadiness. Greater trochanter with tenderness to palpation. No tenderness over piriformis. No SI joint tenderness and normal minimal SI movement.  Procedure: Real-time Ultrasound Guided Injection of greater trochanteric bursa Device: GE Logiq E  Verbal informed consent obtained.  Time-out conducted.  Noted no overlying erythema, induration, or other signs of local infection.  Skin prepped in a sterile fashion.  Local anesthesia: Topical Ethyl chloride.  With sterile technique and under real time ultrasound guidance: 22-gauge spinal needle advanced to the hip abductor's to the greater trochanter, contacted bone and injected 1 cc Kenalog 40, 2 cc lidocaine, 2 cc bupivacaine. Completed without difficulty  Pain immediately resolved suggesting accurate placement of the medication.  Advised to call if fevers/chills, erythema, induration, drainage, or persistent bleeding.  Images permanently stored and available for review in the ultrasound unit.  Impression: Technically successful ultrasound guided injection.  Impression and Recommendations:    Trochanteric bursitis, right hip Did extremely well when on the prednisone burst, recurrence of pain when it stopped, also moderate to severe fatigue consistent with adrenal suppression, he has noted some increased urination, likely related to osmotic glycosuria versus mineralocorticoid suppression from his prednisone burst. Trochanteric bursa injection today, I am going to go ahead and add a prednisone taper, return to see me in a  month. ___________________________________________ Ihor Austinhomas J. Benjamin Stainhekkekandam, M.D., ABFM., CAQSM. Primary Care and Sports Medicine [facility]  Adjunct Instructor of Family Medicine  [employer]

## 2017-07-16 NOTE — Assessment & Plan Note (Addendum)
Did extremely well when on the prednisone burst, recurrence of pain when it stopped, also moderate to severe fatigue consistent with adrenal suppression, he has noted some increased urination, likely related to osmotic glycosuria versus mineralocorticoid suppression from his prednisone burst. Trochanteric bursa injection today, I am going to go ahead and add a prednisone taper, return to see me in a month.

## 2017-08-21 ENCOUNTER — Ambulatory Visit: Payer: Self-pay | Admitting: Sports Medicine

## 2017-08-29 ENCOUNTER — Encounter: Payer: Self-pay | Admitting: Sports Medicine

## 2017-08-29 ENCOUNTER — Ambulatory Visit: Payer: 59 | Admitting: Sports Medicine

## 2017-08-29 DIAGNOSIS — M7061 Trochanteric bursitis, right hip: Secondary | ICD-10-CM | POA: Diagnosis not present

## 2017-08-29 NOTE — Assessment & Plan Note (Signed)
Doing much better after trochanteric bursa injection as well as a prednisone taper at the last visit. They are going to look into their insurance and see if they would like me to do an MRI. He does have some pain residual. We gave him a Thera-Band and taught him some hip abductor exercises. Return as needed.

## 2017-08-29 NOTE — Progress Notes (Signed)
Subjective:    CC: Recheck hip  HPI: Benjamin Warren returns, he is a pleasant 57 year old male, we did a trochanteric bursa injection followed by a taper of prednisone at the last visit.  He has done very well, approximately 60% improved and continuing to improve.  I reviewed the past medical history, family history, social history, surgical history, and allergies today and no changes were needed.  Please see the problem list section below in epic for further details.  Past Medical History: Past Medical History:  Diagnosis Date  . Anxiety 11/21/2016  . Arthritis   . Chronic pain syndrome   . Crepitus of left TMJ on opening of jaw 11/21/2016  . Depression   . Fever blister 11/21/2016  . Fibromyalgia   . GERD (gastroesophageal reflux disease)   . Left rotator cuff tear 10/09/2012  . 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 Sleep apnea    had test-no dr told him he needed cpap  . TMJ syndrome   . Vitamin D deficiency 11/21/2016   Past Surgical History: Past Surgical History:  Procedure Laterality Date  . ELBOW ARTHROPLASTY  1996   right  . HARDWARE REMOVAL  1997   rt wrist  . ORIF WRIST FRACTURE  1997   right  . SHOULDER ARTHROSCOPY  1996   right  . SHOULDER ARTHROSCOPY WITH ROTATOR CUFF REPAIR AND SUBACROMIAL DECOMPRESSION Left 10/09/2012   Procedure: LEFT SHOULDER ARTHROSCOPY WITH SUBACROMIAL DECOMPRESSION, DISTAL CLAVICLE EXCISION DEBRIDEMENT AND ROTATOR CUFF REPAIR;  Surgeon: Eulas PostJoshua P Landau, MD;  Location: [facility];  Service: Orthopedics;  Laterality: Left;   Social History: Social History   Socioeconomic History  . Marital status: Married    Spouse name: Not on file  . Number of children: Not on file  . Years of education: Not on file  . Highest education level: Not on file  Occupational History  . Not on file  Social Needs  . Financial resource strain: Not on file  . Food insecurity:    Worry: Not on file    Inability: Not on file  . Transportation needs:   Medical: Not on file    Non-medical: Not on file  Tobacco Use  . Smoking status: Former Smoker    Packs/day: 0.50    Years: 6.00    Pack years: 3.00    Types: Cigarettes    Last attempt to quit: 10/03/1982    Years since quitting: 34.9  . Smokeless tobacco: Never Used  Substance and Sexual Activity  . Alcohol use: Yes    Comment: occ  . Drug use: Not on file  . Sexual activity: Not on file  Lifestyle  . Physical activity:    Days per week: Not on file    Minutes per session: Not on file  . Stress: Not on file  Relationships  . Social connections:    Talks on phone: Not on file    Gets together: Not on file    Attends religious service: Not on file    Active member of club or organization: Not on file    Attends meetings of clubs or organizations: Not on file    Relationship status: Not on file  Other Topics Concern  . Not on file  Social History Narrative  . Not on file   Family History: Family History  Problem Relation Age of Onset  . Alcohol abuse Mother   . Alcohol abuse Other        FAMILY HISTORY  . Arthritis Other  FAMILY HISTORY  . Stroke Other    Allergies: No Known Allergies Medications: See med rec.  Review of Systems: No fevers, chills, night sweats, weight loss, chest pain, or shortness of breath.   Objective:    General: Well Developed, well nourished, and in no acute distress.  Neuro: Alert and oriented x3, extra-ocular muscles intact, sensation grossly intact.  HEENT: Normocephalic, atraumatic, pupils equal round reactive to light, neck supple, no masses, no lymphadenopathy, thyroid nonpalpable.  Skin: Warm and dry, no rashes. Cardiac: Regular rate and rhythm, no murmurs rubs or gallops, no lower extremity edema.  Respiratory: Clear to auscultation bilaterally. Not using accessory muscles, speaking in full sentences. Right hip: ROM IR: 60 Deg, ER: 60 Deg, Flexion: 120 Deg, Extension: 100 Deg, Abduction: 45 Deg, Adduction: 45  Deg Strength IR: 5/5, ER: 5/5, Flexion: 5/5, Extension: 5/5, Abduction: 5/5, Adduction: 5/5 Pelvic alignment unremarkable to inspection and palpation. Standing hip rotation and gait without trendelenburg / unsteadiness. Greater trochanter without tenderness to palpation. No tenderness over piriformis. No SI joint tenderness and normal minimal SI movement.  Impression and Recommendations:    Trochanteric bursitis, right hip Doing much better after trochanteric bursa injection as well as a prednisone taper at the last visit. They are going to look into their insurance and see if they would like me to do an MRI. He does have some pain residual. We gave him a Thera-Band and taught him some hip abductor exercises. Return as needed.  I spent 25 minutes with this patient, greater than 50% was face-to-face time counseling regarding the above diagnoses ___________________________________________ Benjamin Warren, M.D., ABFM., CAQSM. Primary Care and Sports Medicine [facility]  Adjunct Instructor of Family Medicine  [employer]

## 2017-09-26 ENCOUNTER — Ambulatory Visit: Payer: Self-pay | Admitting: Sports Medicine

## 2017-10-02 ENCOUNTER — Encounter: Payer: Self-pay | Admitting: Internal Medicine

## 2017-10-02 ENCOUNTER — Encounter: Payer: 59 | Admitting: Internal Medicine

## 2017-10-02 ENCOUNTER — Ambulatory Visit: Payer: 59 | Admitting: Sports Medicine

## 2017-10-02 ENCOUNTER — Encounter: Payer: Self-pay | Admitting: Sports Medicine

## 2017-10-02 DIAGNOSIS — M7061 Trochanteric bursitis, right hip: Secondary | ICD-10-CM | POA: Diagnosis not present

## 2017-10-02 NOTE — Progress Notes (Signed)
Subjective:    CC: Follow-up  HPI: This is a pleasant 57 year old male with chronic right greater trochanteric bursitis.  He had an injection 2 months ago with good relief.  Has been pretty consistent with his rehab exercises, continues to have pain.  Some of times his pain goes from the right hip all the way down to the ankle.  No bowel or bladder dysfunction, saddle numbness, constitutional symptoms.  He has been doing a lot of stretching but not a whole lot of strengthening exercises.  I reviewed the past medical history, family history, social history, surgical history, and allergies today and no changes were needed.  Please see the problem list section below in epic for further details.  Past Medical History: Past Medical History:  Diagnosis Date  . Anxiety 11/21/2016  . Arthritis   . Chronic pain syndrome   . Crepitus of left TMJ on opening of jaw 11/21/2016  . Depression   . Fever blister 11/21/2016  . Fibromyalgia   . GERD (gastroesophageal reflux disease)   . Left rotator cuff tear 10/09/2012  . 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 Sleep apnea    had test-no dr told him he needed cpap  . TMJ syndrome   . Vitamin D deficiency 11/21/2016   Past Surgical History: Past Surgical History:  Procedure Laterality Date  . ELBOW ARTHROPLASTY  1996   right  . HARDWARE REMOVAL  1997   rt wrist  . ORIF WRIST FRACTURE  1997   right  . SHOULDER ARTHROSCOPY  1996   right  . SHOULDER ARTHROSCOPY WITH ROTATOR CUFF REPAIR AND SUBACROMIAL DECOMPRESSION Left 10/09/2012   Procedure: LEFT SHOULDER ARTHROSCOPY WITH SUBACROMIAL DECOMPRESSION, DISTAL CLAVICLE EXCISION DEBRIDEMENT AND ROTATOR CUFF REPAIR;  Surgeon: Eulas Post, MD;  Location: [facility];  Service: Orthopedics;  Laterality: Left;   Social History: Social History   Socioeconomic History  . Marital status: Married    Spouse name: Not on file  . Number of children: Not on file  . Years of education: Not on file  .  Highest education level: Not on file  Occupational History  . Not on file  Social Needs  . Financial resource strain: Not on file  . Food insecurity:    Worry: Not on file    Inability: Not on file  . Transportation needs:    Medical: Not on file    Non-medical: Not on file  Tobacco Use  . Smoking status: Former Smoker    Packs/day: 0.50    Years: 6.00    Pack years: 3.00    Types: Cigarettes    Last attempt to quit: 10/03/1982    Years since quitting: 35.0  . Smokeless tobacco: Never Used  Substance and Sexual Activity  . Alcohol use: Yes    Comment: occ  . Drug use: Not on file  . Sexual activity: Not on file  Lifestyle  . Physical activity:    Days per week: Not on file    Minutes per session: Not on file  . Stress: Not on file  Relationships  . Social connections:    Talks on phone: Not on file    Gets together: Not on file    Attends religious service: Not on file    Active member of club or organization: Not on file    Attends meetings of clubs or organizations: Not on file    Relationship status: Not on file  Other Topics Concern  . Not on file  Social History Narrative  . Not on file   Family History: Family History  Problem Relation Age of Onset  . Alcohol abuse Mother   . Alcohol abuse Other        FAMILY HISTORY  . Arthritis Other        FAMILY HISTORY  . Stroke Other    Allergies: No Known Allergies Medications: See med rec.  Review of Systems: No fevers, chills, night sweats, weight loss, chest pain, or shortness of breath.   Objective:    General: Well Developed, well nourished, and in no acute distress.  Neuro: Alert and oriented x3, extra-ocular muscles intact, sensation grossly intact.  HEENT: Normocephalic, atraumatic, pupils equal round reactive to light, neck supple, no masses, no lymphadenopathy, thyroid nonpalpable.  Skin: Warm and dry, no rashes. Cardiac: Regular rate and rhythm, no murmurs rubs or gallops, no lower extremity  edema.  Respiratory: Clear to auscultation bilaterally. Not using accessory muscles, speaking in full sentences.  Impression and Recommendations:    Trochanteric bursitis, right hip Previous trochanteric bursa injection was 2 months ago. Having a recurrence of pain. He can return in a month for a repeat injection. Because some of the pain occasionally goes down to his ankle there is likely a radicular component, so at the beginning of the new year we may do a lumbar spine MRI. Declines any analgesics for pain medication.  I spent 25 minutes with this patient, greater than 50% was face-to-face time counseling regarding the above diagnoses, and specifically the natural history and diagnostic and treatment process for hip pain, as well as the differentials involved. ___________________________________________ Ihor Austinhomas J. Benjamin Stainhekkekandam, M.D., ABFM., CAQSM. Primary Care and Sports Medicine [facility]  Adjunct Instructor of Family Medicine  [employer]

## 2017-10-02 NOTE — Assessment & Plan Note (Signed)
Previous trochanteric bursa injection was 2 months ago. Having a recurrence of pain. He can return in a month for a repeat injection. Because some of the pain occasionally goes down to his ankle there is likely a radicular component, so at the beginning of the new year we may do a lumbar spine MRI. Declines any analgesics for pain medication.

## 2017-10-20 ENCOUNTER — Encounter: Payer: Self-pay | Admitting: Internal Medicine

## 2017-11-07 ENCOUNTER — Encounter: Payer: Self-pay | Admitting: Sports Medicine

## 2017-11-07 ENCOUNTER — Ambulatory Visit (INDEPENDENT_AMBULATORY_CARE_PROVIDER_SITE_OTHER): Payer: 59 | Admitting: Sports Medicine

## 2017-11-07 DIAGNOSIS — M19042 Primary osteoarthritis, left hand: Secondary | ICD-10-CM

## 2017-11-07 DIAGNOSIS — M1812 Unilateral primary osteoarthritis of first carpometacarpal joint, left hand: Secondary | ICD-10-CM | POA: Diagnosis not present

## 2017-11-07 DIAGNOSIS — M7061 Trochanteric bursitis, right hip: Secondary | ICD-10-CM

## 2017-11-07 DIAGNOSIS — M19041 Primary osteoarthritis, right hand: Secondary | ICD-10-CM | POA: Diagnosis not present

## 2017-11-07 NOTE — Assessment & Plan Note (Signed)
Left first CMC injection as above, previous injection was 7.5 months ago.

## 2017-11-07 NOTE — Assessment & Plan Note (Signed)
This is actually doing well today, he noticed an improvement in discomfort when stopping his rehabilitation exercises. No further injections, if he does have recurrence of discomfort we will probably get an updated lumbar spine MRI. He does tell me the pain occasionally goes down to his ankle.

## 2017-11-07 NOTE — Progress Notes (Signed)
Subjective:    CC: Follow-up  HPI: Hand osteoarthritis: Worsening pain at the left thumb basal joint and left fourth DIP.  Previous injections were 6 and 7 months ago.  Pain is moderate, worsening, localized without radiation.  Agreeable to try interventional treatment again today.  Hip pain: History of trochanteric bursitis, injected 3 months ago, this is actually improved as he has discontinued his rehabilitation exercises.  I reviewed the past medical history, family history, social history, surgical history, and allergies today and no changes were needed.  Please see the problem list section below in epic for further details.  Past Medical History: Past Medical History:  Diagnosis Date  . Anxiety 11/21/2016  . Arthritis   . Chronic pain syndrome   . Crepitus of left TMJ on opening of jaw 11/21/2016  . Depression   . Fever blister 11/21/2016  . Fibromyalgia   . GERD (gastroesophageal reflux disease)   . Left rotator cuff tear 10/09/2012  . 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 Sleep apnea    had test-no dr told him he needed cpap  . TMJ syndrome   . Vitamin D deficiency 11/21/2016   Past Surgical History: Past Surgical History:  Procedure Laterality Date  . ELBOW ARTHROPLASTY  1996   right  . HARDWARE REMOVAL  1997   rt wrist  . ORIF WRIST FRACTURE  1997   right  . SHOULDER ARTHROSCOPY  1996   right  . SHOULDER ARTHROSCOPY WITH ROTATOR CUFF REPAIR AND SUBACROMIAL DECOMPRESSION Left 10/09/2012   Procedure: LEFT SHOULDER ARTHROSCOPY WITH SUBACROMIAL DECOMPRESSION, DISTAL CLAVICLE EXCISION DEBRIDEMENT AND ROTATOR CUFF REPAIR;  Surgeon: Eulas Post, MD;  Location: [facility];  Service: Orthopedics;  Laterality: Left;   Social History: Social History   Socioeconomic History  . Marital status: Married    Spouse name: Not on file  . Number of children: Not on file  . Years of education: Not on file  . Highest education level: Not on file  Occupational History    . Not on file  Social Needs  . Financial resource strain: Not on file  . Food insecurity:    Worry: Not on file    Inability: Not on file  . Transportation needs:    Medical: Not on file    Non-medical: Not on file  Tobacco Use  . Smoking status: Former Smoker    Packs/day: 0.50    Years: 6.00    Pack years: 3.00    Types: Cigarettes    Last attempt to quit: 10/03/1982    Years since quitting: 35.1  . Smokeless tobacco: Never Used  Substance and Sexual Activity  . Alcohol use: Yes    Comment: occ  . Drug use: Not on file  . Sexual activity: Not on file  Lifestyle  . Physical activity:    Days per week: Not on file    Minutes per session: Not on file  . Stress: Not on file  Relationships  . Social connections:    Talks on phone: Not on file    Gets together: Not on file    Attends religious service: Not on file    Active member of club or organization: Not on file    Attends meetings of clubs or organizations: Not on file    Relationship status: Not on file  Other Topics Concern  . Not on file  Social History Narrative  . Not on file   Family History: Family History  Problem Relation Age  of Onset  . Alcohol abuse Mother   . Alcohol abuse Other        FAMILY HISTORY  . Arthritis Other        FAMILY HISTORY  . Stroke Other    Allergies: No Known Allergies Medications: See med rec.  Review of Systems: No fevers, chills, night sweats, weight loss, chest pain, or shortness of breath.   Objective:    General: Well Developed, well nourished, and in no acute distress.  Neuro: Alert and oriented x3, extra-ocular muscles intact, sensation grossly intact.  HEENT: Normocephalic, atraumatic, pupils equal round reactive to light, neck supple, no masses, no lymphadenopathy, thyroid nonpalpable.  Skin: Warm and dry, no rashes. Cardiac: Regular rate and rhythm, no murmurs rubs or gallops, no lower extremity edema.  Respiratory: Clear to auscultation bilaterally. Not  using accessory muscles, speaking in full sentences.  Procedure: Real-time Ultrasound Guided Injection of left fourth DIP Device: GE Logiq E  Verbal informed consent obtained.  Time-out conducted.  Noted no overlying erythema, induration, or other signs of local infection.  Skin prepped in a sterile fashion.  Local anesthesia: Topical Ethyl chloride.  With sterile technique and under real time ultrasound guidance: 1/2 cc kenalog 40, 1/2 cc lidocaine injected easily Completed without difficulty  Pain immediately resolved suggesting accurate placement of the medication.  Advised to call if fevers/chills, erythema, induration, drainage, or persistent bleeding.  Images permanently stored and available for review in the ultrasound unit.  Impression: Technically successful ultrasound guided injection.  Procedure: Real-time Ultrasound Guided Injection of left first [facility] Device: GE Logiq E  Verbal informed consent obtained.  Time-out conducted.  Noted no overlying erythema, induration, or other signs of local infection.  Skin prepped in a sterile fashion.  Local anesthesia: Topical Ethyl chloride.  With sterile technique and under real time ultrasound guidance: 1/2 cc kenalog 40, 1/2 cc lidocaine injected easily Completed without difficulty  Pain immediately resolved suggesting accurate placement of the medication.  Advised to call if fevers/chills, erythema, induration, drainage, or persistent bleeding.  Images permanently stored and available for review in the ultrasound unit.  Impression: Technically successful ultrasound guided injection.  Impression and Recommendations:    Primary osteoarthritis of first carpometacarpal joint of left hand Left first CMC injection as above, previous injection was 7.5 months ago.  Primary osteoarthritis of both hands Left fourth DIP injection, previous injection was 6 months ago.  Trochanteric bursitis, right hip This is actually doing well today, he  noticed an improvement in discomfort when stopping his rehabilitation exercises. No further injections, if he does have recurrence of discomfort we will probably get an updated lumbar spine MRI. He does tell me the pain occasionally goes down to his ankle. ___________________________________________ [name]. Benjamin Stain, M.D., ABFM., CAQSM. Primary Care and Sports Medicine [facility]  Adjunct Professor of Family Medicine  [employer]

## 2017-11-07 NOTE — Assessment & Plan Note (Signed)
Left fourth DIP injection, previous injection was 6 months ago.

## 2017-11-27 ENCOUNTER — Ambulatory Visit (INDEPENDENT_AMBULATORY_CARE_PROVIDER_SITE_OTHER): Payer: 59 | Admitting: Psychology

## 2017-11-27 DIAGNOSIS — F330 Major depressive disorder, recurrent, mild: Secondary | ICD-10-CM

## 2017-11-27 DIAGNOSIS — Z23 Encounter for immunization: Secondary | ICD-10-CM | POA: Diagnosis not present

## 2017-12-03 ENCOUNTER — Ambulatory Visit (INDEPENDENT_AMBULATORY_CARE_PROVIDER_SITE_OTHER): Payer: 59 | Admitting: Internal Medicine

## 2017-12-03 ENCOUNTER — Encounter: Payer: Self-pay | Admitting: Internal Medicine

## 2017-12-03 ENCOUNTER — Other Ambulatory Visit (INDEPENDENT_AMBULATORY_CARE_PROVIDER_SITE_OTHER): Payer: 59

## 2017-12-03 VITALS — BP 122/84 | HR 68 | Temp 98.2°F | Ht 70.0 in | Wt 180.0 lb

## 2017-12-03 DIAGNOSIS — F419 Anxiety disorder, unspecified: Secondary | ICD-10-CM | POA: Diagnosis not present

## 2017-12-03 DIAGNOSIS — Z0000 Encounter for general adult medical examination without abnormal findings: Secondary | ICD-10-CM | POA: Diagnosis not present

## 2017-12-03 DIAGNOSIS — G894 Chronic pain syndrome: Secondary | ICD-10-CM

## 2017-12-03 LAB — COMPREHENSIVE METABOLIC PANEL
ALBUMIN: 4.5 g/dL (ref 3.5–5.2)
ALT: 15 U/L (ref 0–53)
AST: 14 U/L (ref 0–37)
Alkaline Phosphatase: 65 U/L (ref 39–117)
BUN: 27 mg/dL — ABNORMAL HIGH (ref 6–23)
CALCIUM: 9.4 mg/dL (ref 8.4–10.5)
CHLORIDE: 105 meq/L (ref 96–112)
CO2: 27 mEq/L (ref 19–32)
Creatinine, Ser: 1.19 mg/dL (ref 0.40–1.50)
GFR: 67.38 mL/min (ref >60.00)
Glucose, Bld: 101 mg/dL — ABNORMAL HIGH (ref 70–99)
POTASSIUM: 4.9 meq/L (ref 3.5–5.1)
SODIUM: 141 meq/L (ref 135–145)
Total Bilirubin: 0.6 mg/dL (ref 0.2–1.2)
Total Protein: 6.8 g/dL (ref 6.0–8.3)

## 2017-12-03 LAB — CBC
HEMATOCRIT: 45.8 % (ref 39.0–52.0)
Hemoglobin: 15.6 g/dL (ref 13.0–17.0)
MCHC: 34.1 g/dL (ref 30.0–36.0)
MCV: 92.4 fl (ref 78.0–100.0)
PLATELETS: 301 10*3/uL (ref 150.0–400.0)
RBC: 4.96 Mil/uL (ref 4.22–5.81)
RDW: 13.2 % (ref 11.5–15.5)
WBC: 10.8 10*3/uL — ABNORMAL HIGH (ref 4.0–10.5)

## 2017-12-03 LAB — LIPID PANEL
CHOL/HDL RATIO: 5
CHOLESTEROL: 195 mg/dL (ref 0–200)
HDL: 38.3 mg/dL — ABNORMAL LOW (ref >39.00)
LDL CALC: 141 mg/dL — AB (ref 0–99)
NonHDL: 156.99
TRIGLYCERIDES: 82 mg/dL (ref 0.0–149.0)
VLDL: 16.4 mg/dL (ref 0.0–40.0)

## 2017-12-03 LAB — TSH: TSH: 1.88 u[IU]/mL (ref 0.35–4.50)

## 2017-12-03 LAB — PSA: PSA: 1.35 ng/mL (ref 0.10–4.00)

## 2017-12-03 LAB — HEMOGLOBIN A1C: Hgb A1c MFr Bld: 5.7 % (ref 4.6–6.5)

## 2017-12-03 MED ORDER — HYDROCORTISONE 1 % EX OINT: 1.0000 "application " | TOPICAL_OINTMENT | Freq: Two times a day (BID) | CUTANEOUS | 0 refills | Status: DC

## 2017-12-03 NOTE — Progress Notes (Signed)
   Subjective:    Patient ID: Glade NurseSteven D Berquist, male    DOB: [birth year], 57 y.o.   MRN: [id number]  HPI The patient is a 57 YO man coming in for physical. Having some problems finding a psych provider.   [facility], social history reviewed and updated.   Review of Systems  Constitutional: Negative.   HENT: Negative.   Eyes: Negative.   Respiratory: Negative for cough, chest tightness and shortness of breath.   Cardiovascular: Negative for chest pain, palpitations and leg swelling.  Gastrointestinal: Negative for abdominal distention, abdominal pain, constipation, diarrhea, nausea and vomiting.  Musculoskeletal: Positive for arthralgias and myalgias.  Skin: Negative.   Neurological: Negative.   Psychiatric/Behavioral: Positive for decreased concentration and dysphoric mood. The patient is nervous/anxious.       Objective:   Physical Exam  Constitutional: He is oriented to person, place, and time. He appears well-developed and well-nourished.  HENT:  Head: Normocephalic and atraumatic.  Eyes: EOM are normal.  Neck: Normal range of motion.  Cardiovascular: Normal rate and regular rhythm.  Pulmonary/Chest: Effort normal and breath sounds normal. No respiratory distress. He has no wheezes. He has no rales.  Abdominal: Soft. Bowel sounds are normal. He exhibits no distension. There is no tenderness. There is no rebound.  Musculoskeletal: He exhibits no edema.  Neurological: He is alert and oriented to person, place, and time. Coordination normal.  Skin: Skin is warm and dry.  Psychiatric: He has a normal mood and affect.   Vitals:   12/03/17 0902  BP: 122/84  Pulse: 68  Temp: 98.2 F (36.8 C)  TempSrc: Oral  SpO2: 98%  Weight: 180 lb (81.6 kg)  Height: 5\' 10"  (1.778 m)      Assessment & Plan:

## 2017-12-03 NOTE — Assessment & Plan Note (Signed)
Using NSAIDs and injections to help.

## 2017-12-03 NOTE — Patient Instructions (Addendum)
Try Dr. Evelene CroonKaur as I have heard a lot of good things about her.   Health Maintenance, Male A healthy lifestyle and preventive care is important for your health and wellness. Ask your health care provider about what schedule of regular examinations is right for you. What should I know about weight and diet? Eat a Healthy Diet  Eat plenty of vegetables, fruits, whole grains, low-fat dairy products, and lean protein.  Do not eat a lot of foods high in solid fats, added sugars, or salt.  Maintain a Healthy Weight Regular exercise can help you achieve or maintain a healthy weight. You should:  Do at least 150 minutes of exercise each week. The exercise should increase your heart rate and make you sweat (moderate-intensity exercise).  Do strength-training exercises at least twice a week.  Watch Your Levels of Cholesterol and Blood Lipids  Have your blood tested for lipids and cholesterol every 5 years starting at 55 years of age. If you are at high risk for heart disease, you should start having your blood tested when you are 55 years old. You may need to have your cholesterol levels checked more often if: ? Your lipid or cholesterol levels are high. ? You are older than 55 years of age. ? You are at high risk for heart disease.  What should I know about cancer screening? Many types of cancers can be detected early and may often be prevented. Lung Cancer  You should be screened every year for lung cancer if: ? You are a current smoker who has smoked for at least 30 years. ? You are a former smoker who has quit within the past 15 years.  Talk to your health care provider about your screening options, when you should start screening, and how often you should be screened.  Colorectal Cancer  Routine colorectal cancer screening usually begins at 55 years of age and should be repeated every 5-10 years until you are 55 years old. You may need to be screened more often if early forms of  precancerous polyps or small growths are found. Your health care provider may recommend screening at an earlier age if you have risk factors for colon cancer.  Your health care provider may recommend using home test kits to check for hidden blood in the stool.  A small camera at the end of a tube can be used to examine your colon (sigmoidoscopy or colonoscopy). This checks for the earliest forms of colorectal cancer.  Prostate and Testicular Cancer  Depending on your age and overall health, your health care provider may do certain tests to screen for prostate and testicular cancer.  Talk to your health care provider about any symptoms or concerns you have about testicular or prostate cancer.  Skin Cancer  Check your skin from head to toe regularly.  Tell your health care provider about any new moles or changes in moles, especially if: ? There is a change in a mole's size, shape, or color. ? You have a mole that is larger than a pencil eraser.  Always use sunscreen. Apply sunscreen liberally and repeat throughout the day.  Protect yourself by wearing long sleeves, pants, a wide-brimmed hat, and sunglasses when outside.  What should I know about heart disease, diabetes, and high blood pressure?  If you are 2818-439 years of age, have your blood pressure checked every 3-5 years. If you are 55 years of age or older, have your blood pressure checked every year. You should have  your blood pressure measured twice-once when you are at a hospital or clinic, and once when you are not at a hospital or clinic. Record the average of the two measurements. To check your blood pressure when you are not at a hospital or clinic, you can use: ? An automated blood pressure machine at a pharmacy. ? A home blood pressure monitor.  Talk to your health care provider about your target blood pressure.  If you are between 45-79 years old, ask your health care provider if you should take aspirin to prevent heart  disease.  Have regular diabetes screenings by checking your fasting blood sugar level. ? If you are at a normal weight and have a low risk for diabetes, have this test once every three years after the age of 45. ? If you are overweight and have a high risk for diabetes, consider being tested at a younger age or more often.  A one-time screening for abdominal aortic aneurysm (AAA) by ultrasound is recommended for men aged 65-75 years who are current or former smokers. What should I know about preventing infection? Hepatitis B If you have a higher risk for hepatitis B, you should be screened for this virus. Talk with your health care provider to find out if you are at risk for hepatitis B infection. Hepatitis C Blood testing is recommended for:  Everyone born from 1945 through 1965.  Anyone with known risk factors for hepatitis C.  Sexually Transmitted Diseases (STDs)  You should be screened each year for STDs including gonorrhea and chlamydia if: ? You are sexually active and are younger than 55 years of age. ? You are older than 55 years of age and your health care provider tells you that you are at risk for this type of infection. ? Your sexual activity has changed since you were last screened and you are at an increased risk for chlamydia or gonorrhea. Ask your health care provider if you are at risk.  Talk with your health care provider about whether you are at high risk of being infected with HIV. Your health care provider may recommend a prescription medicine to help prevent HIV infection.  What else can I do?  Schedule regular health, dental, and eye exams.  Stay current with your vaccines (immunizations).  Do not use any tobacco products, such as cigarettes, chewing tobacco, and e-cigarettes. If you need help quitting, ask your health care provider.  Limit alcohol intake to no more than 2 drinks per day. One drink equals 12 ounces of beer, 5 ounces of wine, or 1 ounces of  hard liquor.  Do not use street drugs.  Do not share needles.  Ask your health care provider for help if you need support or information about quitting drugs.  Tell your health care provider if you often feel depressed.  Tell your health care provider if you have ever been abused or do not feel safe at home. This information is not intended to replace advice given to you by your health care provider. Make sure you discuss any questions you have with your health care provider. Document Released: 06/29/2007 Document Revised: 08/30/2015 Document Reviewed: 10/04/2014 Elsevier Interactive Patient Education  2018 Elsevier Inc.  

## 2017-12-03 NOTE — Assessment & Plan Note (Signed)
Flu shot up to date. Shingrix counseled. Tetanus up to date. Colonoscopy up to date. Counseled about sun safety and mole surveillance. Counseled about the dangers of distracted driving. Given 10 year screening recommendations.   

## 2017-12-03 NOTE — Assessment & Plan Note (Signed)
Seeing psych and not satisfied with treatment and wants to change providers. Advised he can do this.

## 2017-12-04 ENCOUNTER — Encounter: Payer: Self-pay | Admitting: Internal Medicine

## 2017-12-10 ENCOUNTER — Ambulatory Visit: Payer: 59 | Admitting: Psychology

## 2017-12-24 ENCOUNTER — Ambulatory Visit: Payer: 59 | Admitting: Psychology

## 2017-12-28 ENCOUNTER — Other Ambulatory Visit: Payer: Self-pay | Admitting: Internal Medicine

## 2017-12-30 ENCOUNTER — Telehealth: Payer: Self-pay | Admitting: *Deleted

## 2017-12-30 MED ORDER — PREDNISONE 10 MG (48) PO TBPK: ORAL_TABLET | Freq: Every day | ORAL | 0 refills | Status: DC

## 2017-12-30 NOTE — Telephone Encounter (Signed)
Pt left vm stating that you told him he could just call you when he needed a "prednisone pack.

## 2017-12-30 NOTE — Telephone Encounter (Signed)
12-day taper called in.

## 2017-12-30 NOTE — Telephone Encounter (Signed)
Pt.notified

## 2018-01-23 DIAGNOSIS — N529 Male erectile dysfunction, unspecified: Secondary | ICD-10-CM | POA: Diagnosis not present

## 2018-03-20 ENCOUNTER — Other Ambulatory Visit: Payer: Self-pay | Admitting: Sports Medicine

## 2018-03-20 DIAGNOSIS — G729 Myopathy, unspecified: Secondary | ICD-10-CM

## 2018-06-19 IMAGING — CT CT HEAD W/O CM
3 series · 14 of 33 positions shown, 17 images · non-contrast
Comparison: None.

CLINICAL DATA: Memory loss over the past year.

EXAM:
CT HEAD WITHOUT CONTRAST
TECHNIQUE: Contiguous axial images were obtained from the base of the skull
through the vertex without intravenous contrast.

[Series 2: head 5.0 h37s · axial · 0.41mm/px · z∈[+128,+233]mm · 6 of 29 slices shown, 8 images]
[im 5/29  soft-tissue]
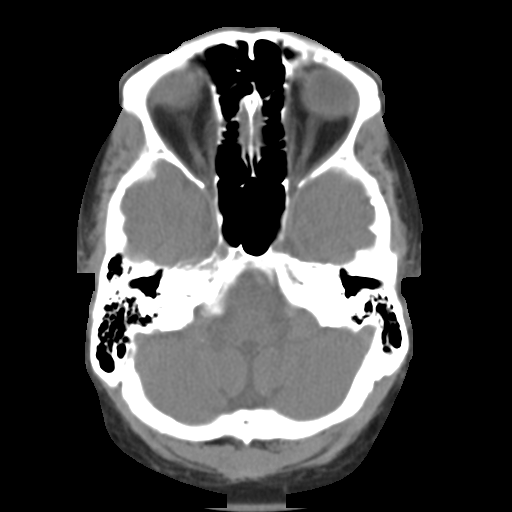
[im 5/29  bone]
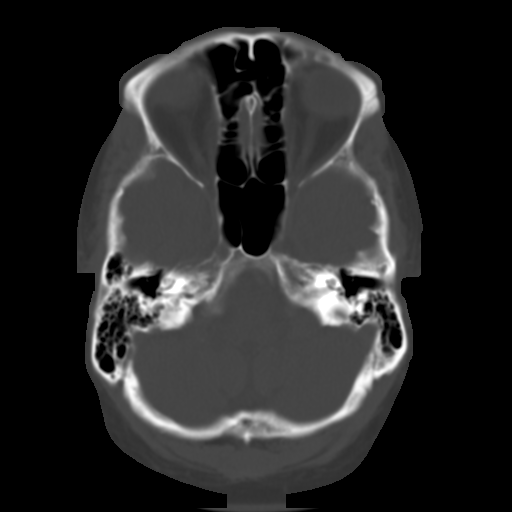
[im 9/29  bone]
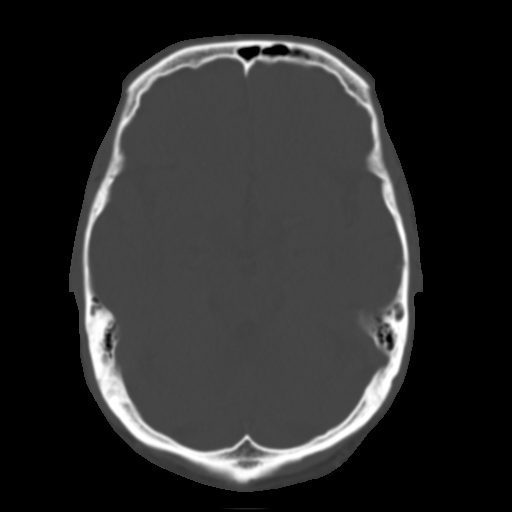
[im 13/29  bone]
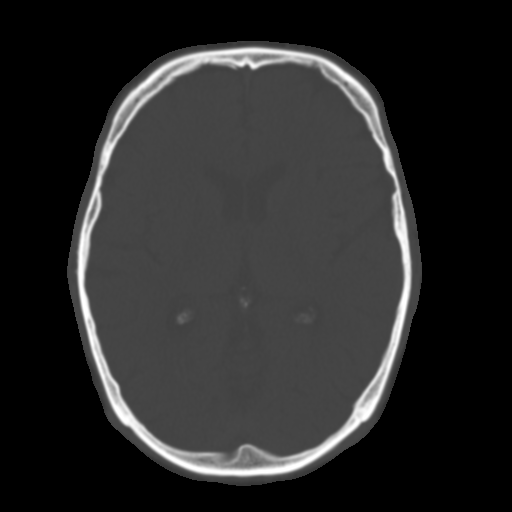
[im 18/29  bone]
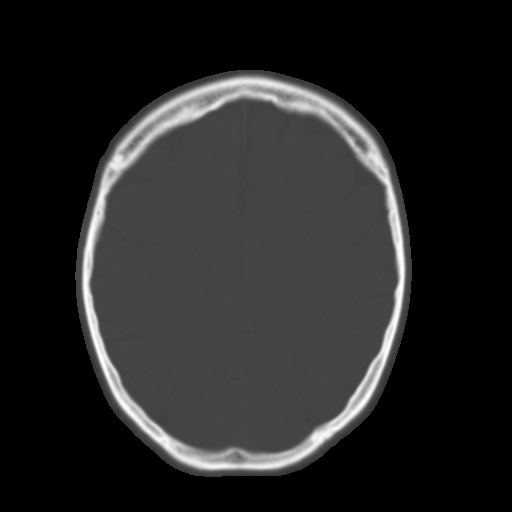
[im 22/29  soft-tissue]
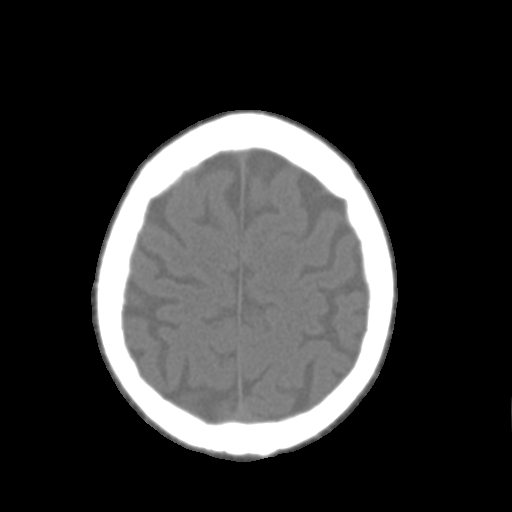
[im 22/29  bone]
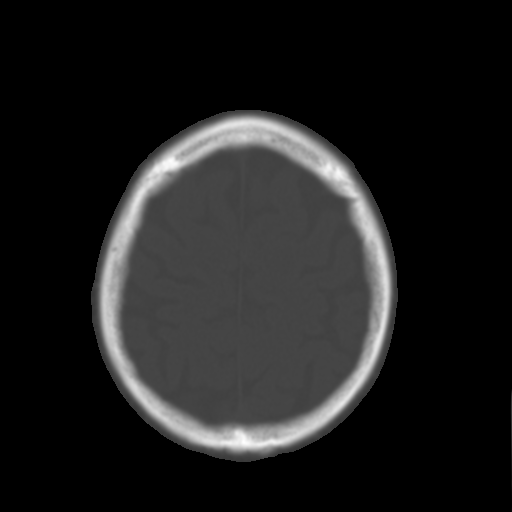
[im 26/29  bone]
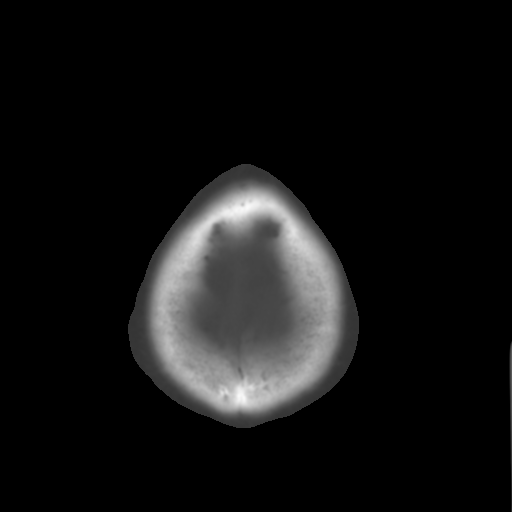

[Series 4: head 3.0 mpr cor · coronal · 0.28mm/px · 3 of 64 slices shown]
[im 13/64  bone]
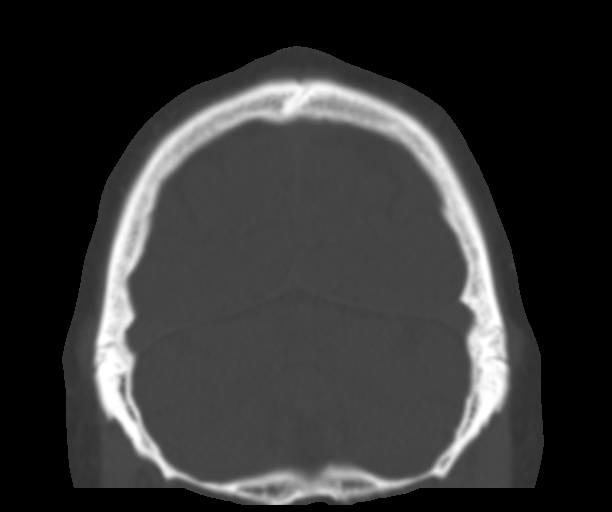
[im 26/64  bone]
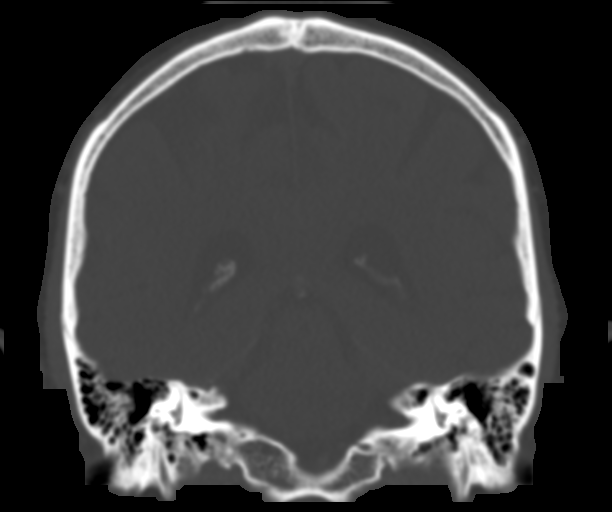
[im 38/64  bone]
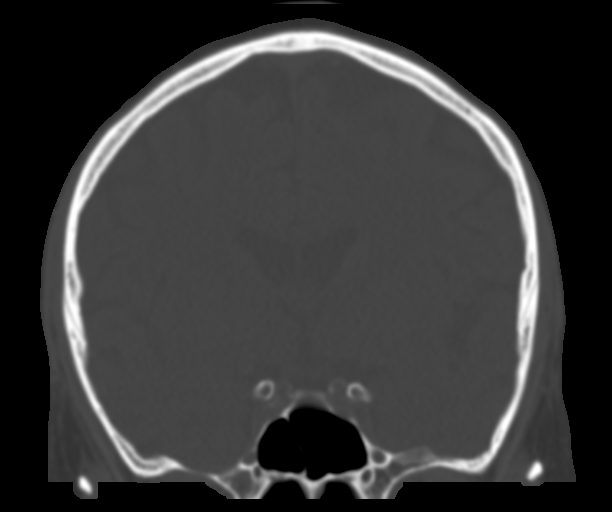

[Series 5: head 3.0 mpr sag · sagittal · 0.28mm/px · 5 of 56 slices shown, 6 images]
[im 19/56  bone]
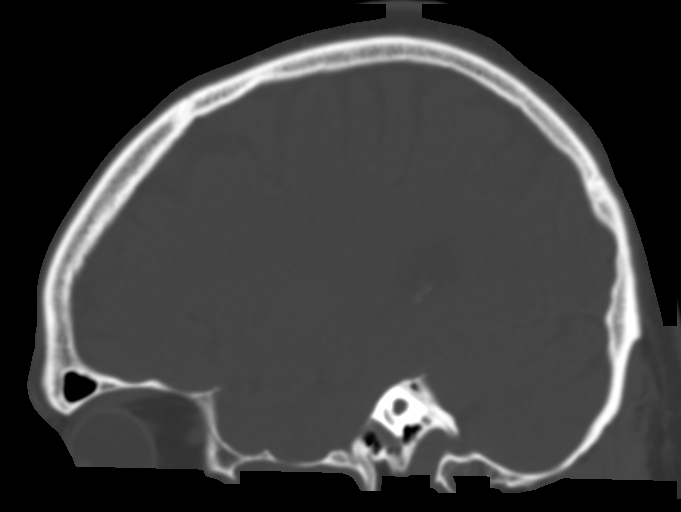
[im 23/56  bone]
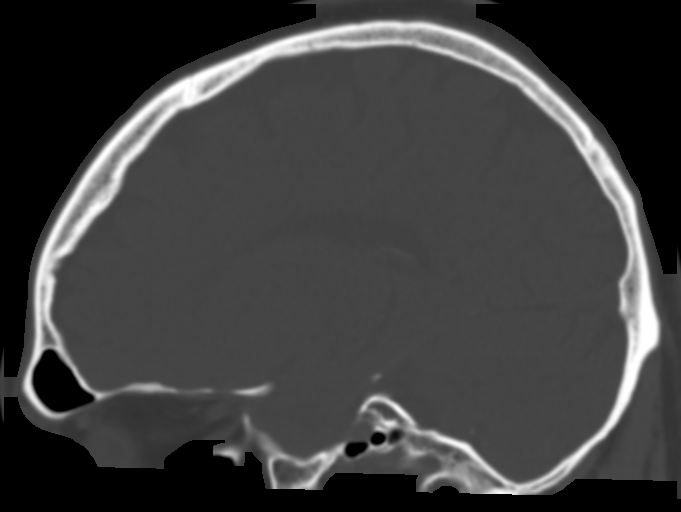
[im 28/56  soft-tissue]
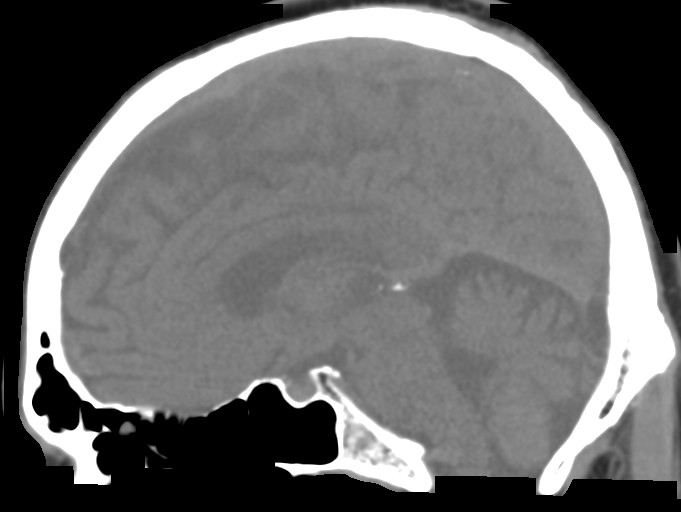
[im 28/56  bone]
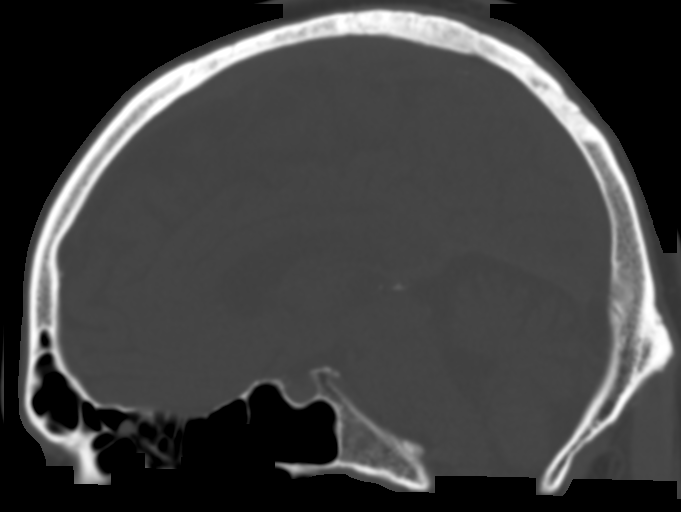
[im 33/56  bone]
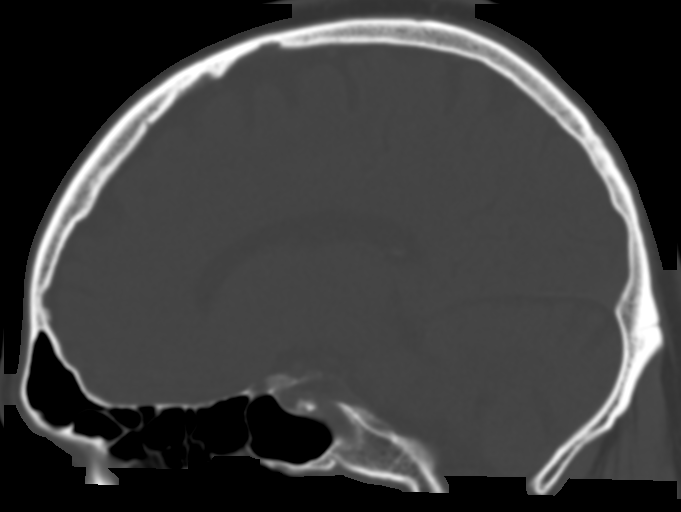
[im 37/56  bone]
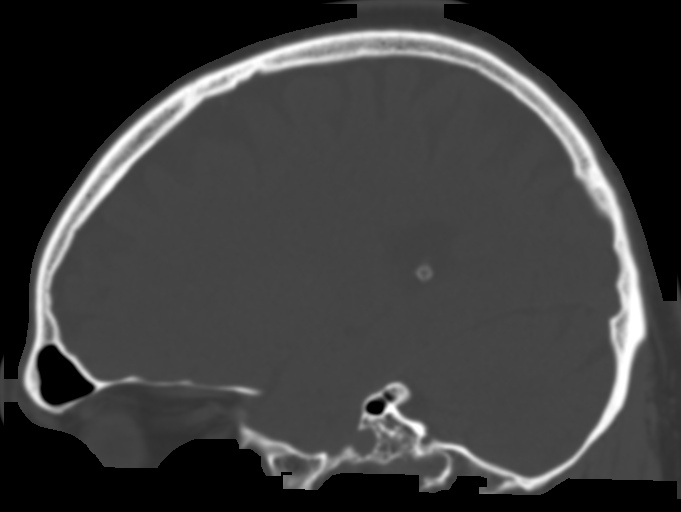

[14 of 33 positions shown; findings below may reference images not displayed]

FINDINGS: Brain: No evidence for acute infarction, hemorrhage, mass lesion,
hydrocephalus, or extra-axial fluid. Premature for age volume loss,
mild cerebral and cerebellar atrophy. No areas of chronic
infarction. No significant white matter hypoattenuation to suggest
small vessel disease.

Vascular: Calcification of the cavernous internal carotid arteries
consistent with cerebrovascular atherosclerotic disease. No signs of
intracranial large vessel occlusion.

Skull: Normal. Negative for fracture or focal lesion.

Sinuses/Orbits: No acute finding.

Other: None.
IMPRESSION: Mild premature for age cerebral and cerebellar atrophy. No focal
brain substance loss. No acute intracranial findings.

## 2018-06-29 ENCOUNTER — Other Ambulatory Visit: Payer: Self-pay | Admitting: Sports Medicine

## 2018-06-29 ENCOUNTER — Ambulatory Visit: Payer: 59 | Admitting: Sports Medicine

## 2018-06-29 DIAGNOSIS — M5126 Other intervertebral disc displacement, lumbar region: Secondary | ICD-10-CM

## 2018-06-29 DIAGNOSIS — M19072 Primary osteoarthritis, left ankle and foot: Secondary | ICD-10-CM

## 2018-06-29 DIAGNOSIS — M1812 Unilateral primary osteoarthritis of first carpometacarpal joint, left hand: Secondary | ICD-10-CM

## 2018-06-29 DIAGNOSIS — M19071 Primary osteoarthritis, right ankle and foot: Secondary | ICD-10-CM

## 2018-06-29 DIAGNOSIS — M5136 Other intervertebral disc degeneration, lumbar region with discogenic back pain only: Secondary | ICD-10-CM

## 2018-06-29 DIAGNOSIS — M48061 Spinal stenosis, lumbar region without neurogenic claudication: Secondary | ICD-10-CM | POA: Insufficient documentation

## 2018-06-29 NOTE — Assessment & Plan Note (Signed)
Rehab exercises given. Return in 6 weeks, MRI for interventional planning if no better.

## 2018-06-29 NOTE — Assessment & Plan Note (Signed)
Bilateral first MTP junctions today, previous injection was in April 2019.

## 2018-06-29 NOTE — Assessment & Plan Note (Signed)
Left first [place] injection today, previous injection was in October 2019.

## 2018-06-29 NOTE — Progress Notes (Signed)
Subjective:    CC: Multiple pains  HPI: CMC arthritis: Pain is moderate, persistent, desires injection.  Bilateral great toe pain: Bilateral first MTP osteoarthritis, injected previously, desires repeat injections, pain is moderate, persistent, localized without radiation.  I reviewed the past medical history, family history, social history, surgical history, and allergies today and no changes were needed.  Please see the problem list section below in epic for further details.  Past Medical History: Past Medical History:  Diagnosis Date  . Anxiety 11/21/2016  . Arthritis   . Chronic pain syndrome   . Crepitus of left TMJ on opening of jaw 11/21/2016  . Depression   . Fever blister 11/21/2016  . Fibromyalgia   . GERD (gastroesophageal reflux disease)   . Left rotator cuff tear 10/09/2012  . 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 Sleep apnea    had test-no dr told him he needed cpap  . TMJ syndrome   . Vitamin D deficiency 11/21/2016   Past Surgical History: Past Surgical History:  Procedure Laterality Date  . ELBOW ARTHROPLASTY  1996   right  . [place]   rt wrist  . ORIF WRIST FRACTURE  1997   right  . SHOULDER ARTHROSCOPY  1996   right  . SHOULDER ARTHROSCOPY WITH ROTATOR CUFF REPAIR AND SUBACROMIAL DECOMPRESSION Left 10/09/2012   Procedure: LEFT SHOULDER ARTHROSCOPY WITH SUBACROMIAL DECOMPRESSION, DISTAL CLAVICLE EXCISION DEBRIDEMENT AND ROTATOR CUFF REPAIR;  Surgeon: Johnny Bridge, MD;  Location: [facility];  Service: Orthopedics;  Laterality: Left;   Social History: Social History   Socioeconomic History  . Marital status: Married    Spouse name: Not on file  . Number of children: Not on file  . Years of education: Not on file  . Highest education level: Not on file  Occupational History  . Not on file  Social Needs  . Financial resource strain: Not on file  . Food insecurity    Worry: Not on file    Inability: Not on file  .  Transportation needs    Medical: Not on file    Non-medical: Not on file  Tobacco Use  . Smoking status: Former Smoker    Packs/day: 0.50    Years: 6.00    Pack years: 3.00    Types: Cigarettes    Quit date: 10/03/1982    Years since quitting: 35.7  . Smokeless tobacco: Never Used  Substance and Sexual Activity  . Alcohol use: Yes    Comment: occ  . Drug use: Not on file  . Sexual activity: Not on file  Lifestyle  . Physical activity    Days per week: Not on file    Minutes per session: Not on file  . Stress: Not on file  Relationships  . Social Herbalist on phone: Not on file    Gets together: Not on file    Attends religious service: Not on file    Active member of club or organization: Not on file    Attends meetings of clubs or organizations: Not on file    Relationship status: Not on file  Other Topics Concern  . Not on file  Social History Narrative  . Not on file   Family History: Family History  Problem Relation Age of Onset  . Alcohol abuse Mother   . Alcohol abuse Other        FAMILY HISTORY  . Arthritis Other        FAMILY  HISTORY  . Stroke Other    Allergies: No Known Allergies Medications: See med rec.  Review of Systems: No fevers, chills, night sweats, weight loss, chest pain, or shortness of breath.   Objective:    General: Well Developed, well nourished, and in no acute distress.  Neuro: Alert and oriented x3, extra-ocular muscles intact, sensation grossly intact.  HEENT: Normocephalic, atraumatic, pupils equal round reactive to light, neck supple, no masses, no lymphadenopathy, thyroid nonpalpable.  Skin: Warm and dry, no rashes. Cardiac: Regular rate and rhythm, no murmurs rubs or gallops, no lower extremity edema.  Respiratory: Clear to auscultation bilaterally. Not using accessory muscles, speaking in full sentences.  Procedure: Real-time Ultrasound Guided injection of the left [facility] Device: GE Logiq E  Verbal informed  consent obtained.  Time-out conducted.  Noted no overlying erythema, induration, or other signs of local infection.  Skin prepped in a sterile fashion.  Local anesthesia: Topical Ethyl chloride.  With sterile technique and under real time ultrasound guidance:  1/2 cc Kenalog 40, 1/2 cc lidocaine injected easily Completed without difficulty  Pain immediately resolved suggesting accurate placement of the medication.  Advised to call if fevers/chills, erythema, induration, drainage, or persistent bleeding.  Images permanently stored and available for review in the ultrasound unit.  Impression: Technically successful ultrasound guided injection.  Procedure: Real-time Ultrasound Guided injection of the left first MTP Device: GE Logiq E  Verbal informed consent obtained.  Time-out conducted.  Noted no overlying erythema, induration, or other signs of local infection.  Skin prepped in a sterile fashion.  Local anesthesia: Topical Ethyl chloride.  With sterile technique and under real time ultrasound guidance:  1/2 cc Kenalog 40, 1/2 cc lidocaine injected easily Completed without difficulty  Pain immediately resolved suggesting accurate placement of the medication.  Advised to call if fevers/chills, erythema, induration, drainage, or persistent bleeding.  Images permanently stored and available for review in the ultrasound unit.  Impression: Technically successful ultrasound guided injection.  Procedure: Real-time Ultrasound Guided injection of the right first MTP Device: GE Logiq E  Verbal informed consent obtained.  Time-out conducted.  Noted no overlying erythema, induration, or other signs of local infection.  Skin prepped in a sterile fashion.  Local anesthesia: Topical Ethyl chloride.  With sterile technique and under real time ultrasound guidance:  1/2 cc Kenalog 40, 1/2 cc lidocaine injected easily Completed without difficulty  Pain immediately resolved suggesting accurate  placement of the medication.  Advised to call if fevers/chills, erythema, induration, drainage, or persistent bleeding.  Images permanently stored and available for review in the ultrasound unit.  Impression: Technically successful ultrasound guided injection.  Impression and Recommendations:    Primary osteoarthritis of first carpometacarpal joint of left hand Left first CMC injection today, previous injection was in October 2019.  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  Discogenic syndrome, lumbar Rehab exercises given. Return in 6 weeks, MRI for interventional planning if no better.   ___________________________________________ Ihor Austinhomas J. Benjamin Stainhekkekandam, M.D., ABFM., CAQSM. Primary Care and Sports Medicine [facility]  Adjunct Professor of Family Medicine  [employer]

## 2018-07-01 ENCOUNTER — Telehealth: Payer: Self-pay | Admitting: Internal Medicine

## 2018-07-01 MED ORDER — PREDNISONE 10 MG (48) PO TBPK: ORAL_TABLET | Freq: Every day | ORAL | 0 refills | Status: DC

## 2018-07-01 NOTE — Telephone Encounter (Signed)
That is too much steroid but okay, sent.  They were made aware of the risks at the visit.

## 2018-07-01 NOTE — Telephone Encounter (Signed)
Wife, Butch Penny, is requesting prednisone pack, since they will be on the road for 12 hours and states that they need this for patient. States that Doctor is aware of this.  Please contact and advise.

## 2018-07-01 NOTE — Addendum Note (Signed)
Addended by: Silverio Decamp on: 07/01/2018 11:33 AM   Modules accepted: Orders

## 2018-07-01 NOTE — Telephone Encounter (Signed)
Pt advised.

## 2018-08-04 ENCOUNTER — Other Ambulatory Visit: Payer: Self-pay

## 2018-08-04 ENCOUNTER — Ambulatory Visit: Payer: 59 | Admitting: Sports Medicine

## 2018-08-04 ENCOUNTER — Ambulatory Visit (INDEPENDENT_AMBULATORY_CARE_PROVIDER_SITE_OTHER): Payer: 59

## 2018-08-04 ENCOUNTER — Encounter: Payer: Self-pay | Admitting: Sports Medicine

## 2018-08-04 DIAGNOSIS — M4807 Spinal stenosis, lumbosacral region: Secondary | ICD-10-CM | POA: Diagnosis not present

## 2018-08-04 DIAGNOSIS — M48062 Spinal stenosis, lumbar region with neurogenic claudication: Secondary | ICD-10-CM

## 2018-08-04 NOTE — Progress Notes (Signed)
Subjective:    CC: Back pain  HPI: Benjamin Warren is a pleasant 58 year old male, he has a long history of axial low back pain with radiation into both legs, he had an MRI back in 2014 that showed some mild spinal stenosis, he has had several bursts of prednisone, physical therapy, in the past he is done well but now having recurrence of discomfort.  Axial, worse with standing, has to walk in a bent forward posture.  Moderate, persistent, no bowel or bladder dysfunction, saddle numbness, or constitutional symptoms.  I reviewed the past medical history, family history, social history, surgical history, and allergies today and no changes were needed.  Please see the problem list section below in epic for further details.  Past Medical History: Past Medical History:  Diagnosis Date  . Anxiety 11/21/2016  . Arthritis   . Chronic pain syndrome   . Crepitus of left TMJ on opening of jaw 11/21/2016  . Depression   . Fever blister 11/21/2016  . Fibromyalgia   . GERD (gastroesophageal reflux disease)   . Left rotator cuff tear 10/09/2012  . 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 Sleep apnea    had test-no dr told him he needed cpap  . TMJ syndrome   . Vitamin D deficiency 11/21/2016   Past Surgical History: Past Surgical History:  Procedure Laterality Date  . ELBOW ARTHROPLASTY  1996   right  . [place]   rt wrist  . ORIF WRIST FRACTURE  1997   right  . SHOULDER ARTHROSCOPY  1996   right  . SHOULDER ARTHROSCOPY WITH ROTATOR CUFF REPAIR AND SUBACROMIAL DECOMPRESSION Left 10/09/2012   Procedure: LEFT SHOULDER ARTHROSCOPY WITH SUBACROMIAL DECOMPRESSION, DISTAL CLAVICLE EXCISION DEBRIDEMENT AND ROTATOR CUFF REPAIR;  Surgeon: Johnny Bridge, MD;  Location: [facility];  Service: Orthopedics;  Laterality: Left;   Social History: Social History   Socioeconomic History  . Marital status: Married    Spouse name: Not on file  . Number of children: Not on file  . Years of  education: Not on file  . Highest education level: Not on file  Occupational History  . Not on file  Social Needs  . Financial resource strain: Not on file  . Food insecurity    Worry: Not on file    Inability: Not on file  . Transportation needs    Medical: Not on file    Non-medical: Not on file  Tobacco Use  . Smoking status: Former Smoker    Packs/day: 0.50    Years: 6.00    Pack years: 3.00    Types: Cigarettes    Quit date: 10/03/1982    Years since quitting: 35.8  . Smokeless tobacco: Never Used  Substance and Sexual Activity  . Alcohol use: Yes    Comment: occ  . Drug use: Not on file  . Sexual activity: Not on file  Lifestyle  . Physical activity    Days per week: Not on file    Minutes per session: Not on file  . Stress: Not on file  Relationships  . Social Herbalist on phone: Not on file    Gets together: Not on file    Attends religious service: Not on file    Active member of club or organization: Not on file    Attends meetings of clubs or organizations: Not on file    Relationship status: Not on file  Other Topics Concern  . Not on file  Social History Narrative  . Not on file   Family History: Family History  Problem Relation Age of Onset  . Alcohol abuse Mother   . Alcohol abuse Other        FAMILY HISTORY  . Arthritis Other        FAMILY HISTORY  . Stroke Other    Allergies: No Known Allergies Medications: See med rec.  Review of Systems: No fevers, chills, night sweats, weight loss, chest pain, or shortness of breath.   Objective:    General: Well Developed, well nourished, and in no acute distress.  Neuro: Alert and oriented x3, extra-ocular muscles intact, sensation grossly intact.  HEENT: Normocephalic, atraumatic, pupils equal round reactive to light, neck supple, no masses, no lymphadenopathy, thyroid nonpalpable.  Skin: Warm and dry, no rashes. Cardiac: Regular rate and rhythm, no murmurs rubs or gallops, no lower  extremity edema.  Respiratory: Clear to auscultation bilaterally. Not using accessory muscles, speaking in full sentences.  Impression and Recommendations:    Lumbar spinal stenosis Mild stenosis at L3-4 and L4-5 on an MRI from 2014. At this point he is failed greater than 6 weeks of physician directed conservative measures, several burst of prednisone. Urine to proceed with MRI for interventional planning. X-rays today, lumbar spine MRI should be scheduled for Monday. Return to see me afterwards to plan intervention.   ___________________________________________ Ihor Austinhomas J. Benjamin Stainhekkekandam, M.D., ABFM., CAQSM. Primary Care and Sports Medicine [facility]  Adjunct Professor of Family Medicine  [employer]

## 2018-08-04 NOTE — Assessment & Plan Note (Signed)
Mild stenosis at L3-4 and L4-5 on an MRI from 2014. At this point he is failed greater than 6 weeks of physician directed conservative measures, several burst of prednisone. Urine to proceed with MRI for interventional planning. X-rays today, lumbar spine MRI should be scheduled for Monday. Return to see me afterwards to plan intervention.

## 2018-08-09 ENCOUNTER — Ambulatory Visit (INDEPENDENT_AMBULATORY_CARE_PROVIDER_SITE_OTHER): Payer: 59

## 2018-08-09 ENCOUNTER — Other Ambulatory Visit: Payer: Self-pay

## 2018-08-09 DIAGNOSIS — M48062 Spinal stenosis, lumbar region with neurogenic claudication: Secondary | ICD-10-CM | POA: Diagnosis not present

## 2018-08-09 DIAGNOSIS — M4807 Spinal stenosis, lumbosacral region: Secondary | ICD-10-CM

## 2018-08-28 ENCOUNTER — Ambulatory Visit
Admission: RE | Admit: 2018-08-28 | Discharge: 2018-08-28 | Disposition: A | Discharge status: Home / Self Care | Payer: 59 | Source: Ambulatory Visit | Attending: Sports Medicine | Admitting: Sports Medicine

## 2018-08-28 MED ORDER — METHYLPREDNISOLONE ACETATE 40 MG/ML INJ SUSP (RADIOLOG
120.0000 mg | Freq: Once | INTRAMUSCULAR | Status: AC
  Administered 2018-08-28: 120 mg via EPIDURAL

## 2018-08-28 MED ORDER — IOPAMIDOL (ISOVUE-M 200) INJECTION 41%
1.0000 mL | Freq: Once | INTRAMUSCULAR | Status: AC
  Administered 2018-08-28: 1 mL via EPIDURAL

## 2018-08-28 NOTE — Discharge Instructions (Signed)

## 2018-09-12 ENCOUNTER — Other Ambulatory Visit: Payer: Self-pay | Admitting: Internal Medicine

## 2018-09-14 NOTE — Telephone Encounter (Signed)
I am not sure what this is for. We do not have cold sores or herpes on his list nor is this on current med list

## 2018-09-14 NOTE — Telephone Encounter (Signed)
Pt stated that he uses this med for cold sores on his lip, he stated that we was getting from an other dr prior to Dr Sharlet Salina  Pharmacy - [facility]  Ins will only cover a 90 day supply  Best number for pt -[phone]

## 2018-09-15 MED ORDER — VALACYCLOVIR HCL 1 G PO TABS: 1000.0000 mg | ORAL_TABLET | Freq: Two times a day (BID) | ORAL | 0 refills | Status: DC

## 2018-09-15 NOTE — Telephone Encounter (Signed)
Sent in valtrex to take 1 pill twice a day for up to 10 days for cold sore.

## 2018-09-15 NOTE — Addendum Note (Signed)
Addended by: Pricilla Holm A on: 09/15/2018 08:00 AM   Modules accepted: Orders

## 2018-10-23 ENCOUNTER — Other Ambulatory Visit: Payer: Self-pay

## 2018-10-23 ENCOUNTER — Encounter: Payer: Self-pay | Admitting: Sports Medicine

## 2018-10-23 ENCOUNTER — Ambulatory Visit: Payer: 59 | Admitting: Sports Medicine

## 2018-10-23 DIAGNOSIS — M1812 Unilateral primary osteoarthritis of first carpometacarpal joint, left hand: Secondary | ICD-10-CM | POA: Diagnosis not present

## 2018-10-23 DIAGNOSIS — M19071 Primary osteoarthritis, right ankle and foot: Secondary | ICD-10-CM | POA: Diagnosis not present

## 2018-10-23 DIAGNOSIS — M19072 Primary osteoarthritis, left ankle and foot: Secondary | ICD-10-CM | POA: Diagnosis not present

## 2018-10-23 NOTE — Progress Notes (Signed)
Subjective:    CC: Bilateral hand pain, foot pain  HPI: Benjamin Warren returns, he is a 58 year old male with bilateral first MTP osteoarthritis, last injections were approximately 4 months ago.  Now having a worsening of pain, moderate, persistent, localized at the MTPs without radiation.  He also has pain at his left thumb basal joint.  Previous injection here was also about 4 months ago.  I reviewed the past medical history, family history, social history, surgical history, and allergies today and no changes were needed.  Please see the problem list section below in epic for further details.  Past Medical History: Past Medical History:  Diagnosis Date  . Anxiety 11/21/2016  . Arthritis   . Chronic pain syndrome   . Crepitus of left TMJ on opening of jaw 11/21/2016  . Depression   . Fever blister 11/21/2016  . Fibromyalgia   . GERD (gastroesophageal reflux disease)   . Left rotator cuff tear 10/09/2012  . 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 Sleep apnea    had test-no dr told him he needed cpap  . TMJ syndrome   . Vitamin D deficiency 11/21/2016   Past Surgical History: Past Surgical History:  Procedure Laterality Date  . ELBOW ARTHROPLASTY  1996   right  . [place]   rt wrist  . ORIF WRIST FRACTURE  1997   right  . SHOULDER ARTHROSCOPY  1996   right  . SHOULDER ARTHROSCOPY WITH ROTATOR CUFF REPAIR AND SUBACROMIAL DECOMPRESSION Left 10/09/2012   Procedure: LEFT SHOULDER ARTHROSCOPY WITH SUBACROMIAL DECOMPRESSION, DISTAL CLAVICLE EXCISION DEBRIDEMENT AND ROTATOR CUFF REPAIR;  Surgeon: Johnny Bridge, MD;  Location: [facility];  Service: Orthopedics;  Laterality: Left;   Social History: Social History   Socioeconomic History  . Marital status: Married    Spouse name: Not on file  . Number of children: Not on file  . Years of education: Not on file  . Highest education level: Not on file  Occupational History  . Not on file  Social Needs  . Financial  resource strain: Not on file  . Food insecurity    Worry: Not on file    Inability: Not on file  . Transportation needs    Medical: Not on file    Non-medical: Not on file  Tobacco Use  . Smoking status: Former Smoker    Packs/day: 0.50    Years: 6.00    Pack years: 3.00    Types: Cigarettes    Quit date: 10/03/1982    Years since quitting: 36.0  . Smokeless tobacco: Never Used  Substance and Sexual Activity  . Alcohol use: Yes    Comment: occ  . Drug use: Not on file  . Sexual activity: Not on file  Lifestyle  . Physical activity    Days per week: Not on file    Minutes per session: Not on file  . Stress: Not on file  Relationships  . Social Herbalist on phone: Not on file    Gets together: Not on file    Attends religious service: Not on file    Active member of club or organization: Not on file    Attends meetings of clubs or organizations: Not on file    Relationship status: Not on file  Other Topics Concern  . Not on file  Social History Narrative  . Not on file   Family History: Family History  Problem Relation Age of Onset  . Alcohol  abuse Mother   . Alcohol abuse Other        FAMILY HISTORY  . Arthritis Other        FAMILY HISTORY  . Stroke Other    Allergies: No Known Allergies Medications: See med rec.  Review of Systems: No fevers, chills, night sweats, weight loss, chest pain, or shortness of breath.   Objective:    General: Well Developed, well nourished, and in no acute distress.  Neuro: Alert and oriented x3, extra-ocular muscles intact, sensation grossly intact.  HEENT: Normocephalic, atraumatic, pupils equal round reactive to light, neck supple, no masses, no lymphadenopathy, thyroid nonpalpable.  Skin: Warm and dry, no rashes. Cardiac: Regular rate and rhythm, no murmurs rubs or gallops, no lower extremity edema.  Respiratory: Clear to auscultation bilaterally. Not using accessory muscles, speaking in full sentences. Bilateral  feet: No visible erythema or swelling. Range of motion is full in all directions. Strength is 5/5 in all directions. No hallux valgus. No pes cavus or pes planus. No abnormal callus noted. No pain over the navicular prominence, or base of fifth metatarsal. No tenderness to palpation of the calcaneal insertion of plantar fascia. No pain at the Achilles insertion. No pain over the calcaneal bursa. No pain of the retrocalcaneal bursa. Tender to palpation at the first MTP bilaterally No hallux rigidus or limitus. No tenderness palpation over interphalangeal joints. No pain with compression of the metatarsal heads. Neurovascularly intact distally. Left hand: Squared off bossing of the first CMC with tenderness.  Procedure: Real-time Ultrasound Guided injection of the left first MTP Device: GE Logiq E  Verbal informed consent obtained.  Time-out conducted.  Noted no overlying erythema, induration, or other signs of local infection.  Skin prepped in a sterile fashion.  Local anesthesia: Topical Ethyl chloride.  With sterile technique and under real time ultrasound guidance:  1/2 cc Kenalog 40, 1/2 cc lidocaine injected easily Completed without difficulty  Pain immediately resolved suggesting accurate placement of the medication.  Advised to call if fevers/chills, erythema, induration, drainage, or persistent bleeding.  Images permanently stored and available for review in the ultrasound unit.  Impression: Technically successful ultrasound guided injection.   Procedure: Real-time Ultrasound Guided injection of the right first MTP Device: GE Logiq E  Verbal informed consent obtained.  Time-out conducted.  Noted no overlying erythema, induration, or other signs of local infection.  Skin prepped in a sterile fashion.  Local anesthesia: Topical Ethyl chloride.  With sterile technique and under real time ultrasound guidance:  1/2 cc Kenalog 40, 1/2 cc lidocaine injected easily Completed  without difficulty  Pain immediately resolved suggesting accurate placement of the medication.  Advised to call if fevers/chills, erythema, induration, drainage, or persistent bleeding.  Images permanently stored and available for review in the ultrasound unit.  Impression: Technically successful ultrasound guided injection.  Procedure: Real-time Ultrasound Guided injection of the left first [facility] Device: GE Logiq E  Verbal informed consent obtained.  Time-out conducted.  Noted no overlying erythema, induration, or other signs of local infection.  Skin prepped in a sterile fashion.  Local anesthesia: Topical Ethyl chloride.  With sterile technique and under real time ultrasound guidance:  1/2 cc Kenalog 40, 1/2 cc lidocaine injected easily Completed without difficulty  Pain immediately resolved suggesting accurate placement of the medication.  Advised to call if fevers/chills, erythema, induration, drainage, or persistent bleeding.  Images permanently stored and available for review in the ultrasound unit.  Impression: Technically successful ultrasound guided injection.  Impression and  Recommendations:    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 Bilateral first MTP injections today, previous injections were in June of this year.  Primary osteoarthritis of first carpometacarpal joint of left hand Left first CMC injection today, previous injection was in June of this year.   ___________________________________________ [name]. Benjamin Stain, M.D., ABFM., CAQSM. Primary Care and Sports Medicine [facility]  Adjunct Professor of Family Medicine  [employer]

## 2018-10-23 NOTE — Assessment & Plan Note (Signed)
Left first [place] injection today, previous injection was in June of this year.

## 2018-10-23 NOTE — Assessment & Plan Note (Signed)
Bilateral first MTP injections today, previous injections were in June of this year.

## 2018-10-24 ENCOUNTER — Ambulatory Visit (INDEPENDENT_AMBULATORY_CARE_PROVIDER_SITE_OTHER): Payer: 59

## 2018-10-24 DIAGNOSIS — Z23 Encounter for immunization: Secondary | ICD-10-CM

## 2018-11-02 ENCOUNTER — Telehealth: Payer: Self-pay | Admitting: Sports Medicine

## 2018-11-02 DIAGNOSIS — M48062 Spinal stenosis, lumbar region with neurogenic claudication: Secondary | ICD-10-CM

## 2018-11-02 NOTE — Telephone Encounter (Signed)
I saw Benjamin Warren his wife today, she told me he got good relief on the right but is now having symptoms on the left, he would like to repeat the epidural but this time on the left side.

## 2018-11-04 ENCOUNTER — Inpatient Hospital Stay: Admission: RE | Admit: 2018-11-04 | Payer: 59 | Source: Ambulatory Visit

## 2018-11-09 ENCOUNTER — Other Ambulatory Visit: Payer: Self-pay

## 2018-11-09 ENCOUNTER — Ambulatory Visit
Admission: RE | Admit: 2018-11-09 | Discharge: 2018-11-09 | Disposition: A | Discharge status: Home / Self Care | Payer: 59 | Source: Ambulatory Visit | Attending: Sports Medicine | Admitting: Sports Medicine

## 2018-11-09 MED ORDER — METHYLPREDNISOLONE ACETATE 40 MG/ML INJ SUSP (RADIOLOG
120.0000 mg | Freq: Once | INTRAMUSCULAR | Status: AC
  Administered 2018-11-09: 120 mg via EPIDURAL

## 2018-11-09 MED ORDER — IOPAMIDOL (ISOVUE-M 200) INJECTION 41%
1.0000 mL | Freq: Once | INTRAMUSCULAR | Status: AC
  Administered 2018-11-09: 1 mL via EPIDURAL

## 2018-12-04 ENCOUNTER — Telehealth: Payer: Self-pay

## 2018-12-04 NOTE — Telephone Encounter (Signed)
Should just come to visit, does not need to be fasting.

## 2018-12-04 NOTE — Telephone Encounter (Signed)
Copied from [place] [id number]. Topic: General - Inquiry >> Dec 04, 2018  9:38 AM Reyne Dumas L wrote: Reason for CRM:   Pt's wife calling.  She states that pt has a CPE Monday afternoon and she doesn't think he can fast that long.  Pt's wife wants to know if pt can come in Monday morning early and get labs done and then come back for CPE with PCP. Butch Penny, can be reached at [phone].

## 2018-12-04 NOTE — Telephone Encounter (Signed)
LVM informing  of MD response  

## 2018-12-07 ENCOUNTER — Encounter: Payer: Self-pay | Admitting: Internal Medicine

## 2018-12-07 ENCOUNTER — Other Ambulatory Visit: Payer: Self-pay

## 2018-12-07 ENCOUNTER — Other Ambulatory Visit (INDEPENDENT_AMBULATORY_CARE_PROVIDER_SITE_OTHER): Payer: 59

## 2018-12-07 ENCOUNTER — Ambulatory Visit (INDEPENDENT_AMBULATORY_CARE_PROVIDER_SITE_OTHER): Payer: 59 | Admitting: Internal Medicine

## 2018-12-07 ENCOUNTER — Telehealth: Payer: Self-pay

## 2018-12-07 VITALS — BP 122/80 | HR 65 | Temp 98.1°F | Ht 70.0 in | Wt 185.0 lb

## 2018-12-07 DIAGNOSIS — Z0000 Encounter for general adult medical examination without abnormal findings: Secondary | ICD-10-CM | POA: Diagnosis not present

## 2018-12-07 DIAGNOSIS — Z23 Encounter for immunization: Secondary | ICD-10-CM

## 2018-12-07 DIAGNOSIS — E559 Vitamin D deficiency, unspecified: Secondary | ICD-10-CM

## 2018-12-07 DIAGNOSIS — F419 Anxiety disorder, unspecified: Secondary | ICD-10-CM

## 2018-12-07 DIAGNOSIS — K219 Gastro-esophageal reflux disease without esophagitis: Secondary | ICD-10-CM

## 2018-12-07 DIAGNOSIS — H9193 Unspecified hearing loss, bilateral: Secondary | ICD-10-CM

## 2018-12-07 LAB — COMPREHENSIVE METABOLIC PANEL
ALT: 15 U/L (ref 0–53)
AST: 16 U/L (ref 0–37)
Albumin: 4.3 g/dL (ref 3.5–5.2)
Alkaline Phosphatase: 75 U/L (ref 39–117)
BUN: 24 mg/dL — ABNORMAL HIGH (ref 6–23)
CO2: 27 mEq/L (ref 19–32)
Calcium: 9.6 mg/dL (ref 8.4–10.5)
Chloride: 103 mEq/L (ref 96–112)
Creatinine, Ser: 1.06 mg/dL (ref 0.40–1.50)
GFR: 72.18 mL/min (ref >60.00)
Glucose, Bld: 98 mg/dL (ref 70–99)
Potassium: 4.1 mEq/L (ref 3.5–5.1)
Sodium: 139 mEq/L (ref 135–145)
Total Bilirubin: 0.6 mg/dL (ref 0.2–1.2)
Total Protein: 7.1 g/dL (ref 6.0–8.3)

## 2018-12-07 LAB — CBC
HCT: 43.7 % (ref 39.0–52.0)
Hemoglobin: 14.7 g/dL (ref 13.0–17.0)
MCHC: 33.5 g/dL (ref 30.0–36.0)
MCV: 94.1 fl (ref 78.0–100.0)
Platelets: 302 10*3/uL (ref 150.0–400.0)
RBC: 4.65 Mil/uL (ref 4.22–5.81)
RDW: 14 % (ref 11.5–15.5)
WBC: 11.9 10*3/uL — ABNORMAL HIGH (ref 4.0–10.5)

## 2018-12-07 LAB — LIPID PANEL
Cholesterol: 200 mg/dL (ref 0–200)
HDL: 37.4 mg/dL — ABNORMAL LOW (ref >39.00)
LDL Cholesterol: 138 mg/dL — ABNORMAL HIGH (ref 0–99)
NonHDL: 162.8
Total CHOL/HDL Ratio: 5
Triglycerides: 122 mg/dL (ref 0.0–149.0)
VLDL: 24.4 mg/dL (ref 0.0–40.0)

## 2018-12-07 LAB — HEMOGLOBIN A1C: Hgb A1c MFr Bld: 5.6 % (ref 4.6–6.5)

## 2018-12-07 LAB — VITAMIN D 25 HYDROXY (VIT D DEFICIENCY, FRACTURES): VITD: 40.93 ng/mL (ref 30.00–100.00)

## 2018-12-07 LAB — PSA: PSA: 1.94 ng/mL (ref 0.10–4.00)

## 2018-12-07 NOTE — Telephone Encounter (Signed)
Copied from [place] [id number]. Topic: General - Inquiry >> Dec 07, 2018  3:00 PM Mathis Bud wrote: Reason for CRM: Patient has lab work done today 11/23, patient would to see if his vitamin D levels were checked.   Call back [phone]

## 2018-12-07 NOTE — Progress Notes (Signed)
   Subjective:   Patient ID: Benjamin Warren, male    DOB: [birth year], 58 y.o.   MRN: [id number]  HPI The patient is a 58 YO man coming in for physical.  PMH, [place], social history reviewed and updated  Review of Systems  Constitutional: Positive for activity change.  HENT: Negative.   Eyes: Negative.   Respiratory: Positive for chest tightness. Negative for cough and shortness of breath.   Cardiovascular: Negative for chest pain, palpitations and leg swelling.  Gastrointestinal: Negative for abdominal distention, abdominal pain, constipation, diarrhea, nausea and vomiting.  Musculoskeletal: Positive for arthralgias, joint swelling and myalgias.  Skin: Negative.   Neurological: Negative.   Psychiatric/Behavioral: Positive for decreased concentration and dysphoric mood. The patient is nervous/anxious.     Objective:  Physical Exam Constitutional:      Appearance: He is well-developed.  HENT:     Head: Normocephalic and atraumatic.  Neck:     Musculoskeletal: Normal range of motion.  Cardiovascular:     Rate and Rhythm: Normal rate and regular rhythm.  Pulmonary:     Effort: Pulmonary effort is normal. No respiratory distress.     Breath sounds: Normal breath sounds. No wheezing or rales.  Abdominal:     General: Bowel sounds are normal. There is no distension.     Palpations: Abdomen is soft.     Tenderness: There is no abdominal tenderness. There is no rebound.  Musculoskeletal:        General: Tenderness present.  Skin:    General: Skin is warm and dry.  Neurological:     Mental Status: He is alert and oriented to person, place, and time.     Coordination: Coordination normal.     Vitals:   12/07/18 1321  BP: 122/80  Pulse: 65  Temp: 98.1 F (36.7 C)  TempSrc: Oral  SpO2: 97%  Weight: 185 lb (83.9 kg)  Height: 5\' 10"  (1.778 m)   EKG: Rate 64, axis normal, intervals normal, sinus, no st or t wave changes, no change from prior 2015  This visit occurred during  the SARS-CoV-2 public health emergency.  Safety protocols were in place, including screening questions prior to the visit, additional usage of staff PPE, and extensive cleaning of exam room while observing appropriate contact time as indicated for disinfecting solutions.   Assessment & Plan:  Shingrix IM and Pneumonia 23 given at visit

## 2018-12-07 NOTE — Telephone Encounter (Signed)
You can call lab and ask

## 2018-12-07 NOTE — Telephone Encounter (Signed)
Can we add a Vit D lab?

## 2018-12-07 NOTE — Patient Instructions (Addendum)
EKG looks normal. We are checking the labs today. Let us know if you need the MMR titer and we can check that.   Come back for the last shingles vaccine in 2-4 months.   Health Maintenance, Male Adopting a healthy lifestyle and getting preventive care are important in promoting health and wellness. Ask your health care provider about:  The right schedule for you to have regular tests and exams.  Things you can do on your own to prevent diseases and keep yourself healthy. What should I know about diet, weight, and exercise? Eat a healthy diet   Eat a diet that includes plenty of vegetables, fruits, low-fat dairy products, and lean protein.  Do not eat a lot of foods that are high in solid fats, added sugars, or sodium. Maintain a healthy weight Body mass index (BMI) is a measurement that can be used to identify possible weight problems. It estimates body fat based on height and weight. Your health care provider can help determine your BMI and help you achieve or maintain a healthy weight. Get regular exercise Get regular exercise. This is one of the most important things you can do for your health. Most adults should:  Exercise for at least 150 minutes each week. The exercise should increase your heart rate and make you sweat (moderate-intensity exercise).  Do strengthening exercises at least twice a week. This is in addition to the moderate-intensity exercise.  Spend less time sitting. Even light physical activity can be beneficial. Watch cholesterol and blood lipids Have your blood tested for lipids and cholesterol at 56 years of age, then have this test every 5 years. You may need to have your cholesterol levels checked more often if:  Your lipid or cholesterol levels are high.  You are older than 56 years of age.  You are at high risk for heart disease. What should I know about cancer screening? Many types of cancers can be detected early and may often be prevented. Depending  on your health history and family history, you may need to have cancer screening at various ages. This may include screening for:  Colorectal cancer.  Prostate cancer.  Skin cancer.  Lung cancer. What should I know about heart disease, diabetes, and high blood pressure? Blood pressure and heart disease  High blood pressure causes heart disease and increases the risk of stroke. This is more likely to develop in people who have high blood pressure readings, are of African descent, or are overweight.  Talk with your health care provider about your target blood pressure readings.  Have your blood pressure checked: ? Every 3-5 years if you are 57-69 years of age. ? Every year if you are 23 years old or older.  If you are between the ages of 51 and 69 and are a current or former smoker, ask your health care provider if you should have a one-time screening for abdominal aortic aneurysm (AAA). Diabetes Have regular diabetes screenings. This checks your fasting blood sugar level. Have the screening done:  Once every three years after age 45 if you are at a normal weight and have a low risk for diabetes.  More often and at a younger age if you are overweight or have a high risk for diabetes. What should I know about preventing infection? Hepatitis B If you have a higher risk for hepatitis B, you should be screened for this virus. Talk with your health care provider to find out if you are at risk for  hepatitis B infection. Hepatitis C Blood testing is recommended for:  Everyone born from [birth year] through 1963.  Anyone with known risk factors for hepatitis C. Sexually transmitted infections (STIs)  You should be screened each year for STIs, including gonorrhea and chlamydia, if: ? You are sexually active and are younger than 56 years of age. ? You are older than 56 years of age and your health care provider tells you that you are at risk for this type of infection. ? Your sexual activity has  changed since you were last screened, and you are at increased risk for chlamydia or gonorrhea. Ask your health care provider if you are at risk.  Ask your health care provider about whether you are at high risk for HIV. Your health care provider may recommend a prescription medicine to help prevent HIV infection. If you choose to take medicine to prevent HIV, you should first get tested for HIV. You should then be tested every 3 months for as long as you are taking the medicine. Follow these instructions at home: Lifestyle  Do not use any products that contain nicotine or tobacco, such as cigarettes, e-cigarettes, and chewing tobacco. If you need help quitting, ask your health care provider.  Do not use street drugs.  Do not share needles.  Ask your health care provider for help if you need support or information about quitting drugs. Alcohol use  Do not drink alcohol if your health care provider tells you not to drink.  If you drink alcohol: ? Limit how much you have to 0-2 drinks a day. ? Be aware of how much alcohol is in your drink. In the [place] one drink equals one 12 oz bottle of beer (355 mL), one 5 oz glass of wine (148 mL), or one 1 oz glass of hard liquor (44 mL). General instructions  Schedule regular health, dental, and eye exams.  Stay current with your vaccines.  Tell your health care provider if: ? You often feel depressed. ? You have ever been abused or do not feel safe at home. Summary  Adopting a healthy lifestyle and getting preventive care are important in promoting health and wellness.  Follow your health care provider's instructions about healthy diet, exercising, and getting tested or screened for diseases.  Follow your health care provider's instructions on monitoring your cholesterol and blood pressure. This information is not intended to replace advice given to you by your health care provider. Make sure you discuss any questions you have with your  health care provider. Document Released: 06/29/2007 Document Revised: 12/24/2017 Document Reviewed: 12/24/2017 Elsevier Patient Education  2020 [employer].

## 2018-12-07 NOTE — Telephone Encounter (Signed)
Lab added

## 2018-12-08 NOTE — Assessment & Plan Note (Signed)
Checking level today

## 2018-12-08 NOTE — Assessment & Plan Note (Signed)
Taking omeprazole and overall control is gone.

## 2018-12-08 NOTE — Assessment & Plan Note (Signed)
Likely cause of the chest tightness. EKG done which is not changed from baseline. Still seeing psych and overall satisfied with level of control.

## 2018-12-08 NOTE — Assessment & Plan Note (Signed)
Flu shot up to date. Pneumonia given 23. Shingrix given 1st today. Tetanus up to date. Colonoscopy up to date. Counseled about sun safety and mole surveillance. Counseled about the dangers of distracted driving. Given 10 year screening recommendations.

## 2018-12-21 ENCOUNTER — Other Ambulatory Visit: Payer: Self-pay | Admitting: Sports Medicine

## 2018-12-21 ENCOUNTER — Other Ambulatory Visit: Payer: Self-pay | Admitting: *Deleted

## 2018-12-21 DIAGNOSIS — G729 Myopathy, unspecified: Secondary | ICD-10-CM

## 2018-12-21 MED ORDER — ETODOLAC ER 600 MG PO TB24: 600.0000 mg | ORAL_TABLET | Freq: Two times a day (BID) | ORAL | 2 refills | Status: DC

## 2019-01-12 ENCOUNTER — Ambulatory Visit (INDEPENDENT_AMBULATORY_CARE_PROVIDER_SITE_OTHER): Payer: 59

## 2019-01-12 ENCOUNTER — Ambulatory Visit: Payer: 59 | Admitting: Sports Medicine

## 2019-01-12 ENCOUNTER — Other Ambulatory Visit: Payer: Self-pay

## 2019-01-12 ENCOUNTER — Encounter: Payer: Self-pay | Admitting: Sports Medicine

## 2019-01-12 DIAGNOSIS — M7541 Impingement syndrome of right shoulder: Secondary | ICD-10-CM | POA: Diagnosis not present

## 2019-01-12 NOTE — Assessment & Plan Note (Signed)
Fairly benign exam, rotator cuff rehab, x-rays, return to see me in a month, injection if no better.

## 2019-01-12 NOTE — Progress Notes (Signed)
Subjective:    CC: Right shoulder pain  HPI: For couple of days this 59 year old male is had pain in his right shoulder, localized over the deltoid, worse with abduction and overhead activities, moderate, persist, no recent trauma, he does have a history of a shoulder dislocation in the past.  I reviewed the past medical history, family history, social history, surgical history, and allergies today and no changes were needed.  Please see the problem list section below in epic for further details.  Past Medical History: Past Medical History:  Diagnosis Date  . Anxiety 11/21/2016  . Arthritis   . Chronic pain syndrome   . Crepitus of left TMJ on opening of jaw 11/21/2016  . Depression   . Fever blister 11/21/2016  . Fibromyalgia   . GERD (gastroesophageal reflux disease)   . Left rotator cuff tear 10/09/2012  . 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 Sleep apnea    had test-no dr told him he needed cpap  . TMJ syndrome   . Vitamin D deficiency 11/21/2016   Past Surgical History: Past Surgical History:  Procedure Laterality Date  . ELBOW ARTHROPLASTY  1996   right  . [place]   rt wrist  . ORIF WRIST FRACTURE  1997   right  . SHOULDER ARTHROSCOPY  1996   right  . SHOULDER ARTHROSCOPY WITH ROTATOR CUFF REPAIR AND SUBACROMIAL DECOMPRESSION Left 10/09/2012   Procedure: LEFT SHOULDER ARTHROSCOPY WITH SUBACROMIAL DECOMPRESSION, DISTAL CLAVICLE EXCISION DEBRIDEMENT AND ROTATOR CUFF REPAIR;  Surgeon: Johnny Bridge, MD;  Location: [facility];  Service: Orthopedics;  Laterality: Left;   Social History: Social History   Socioeconomic History  . Marital status: Married    Spouse name: Not on file  . Number of children: Not on file  . Years of education: Not on file  . Highest education level: Not on file  Occupational History  . Not on file  Tobacco Use  . Smoking status: Former Smoker    Packs/day: 0.50    Years: 6.00    Pack years: 3.00    Types:  Cigarettes    Quit date: 10/03/1982    Years since quitting: 36.3  . Smokeless tobacco: Never Used  Substance and Sexual Activity  . Alcohol use: Yes    Comment: occ  . Drug use: Not on file  . Sexual activity: Not on file  Other Topics Concern  . Not on file  Social History Narrative  . Not on file   Social Determinants of Health   Financial Resource Strain:   . Difficulty of Paying Living Expenses: Not on file  Food Insecurity:   . Worried About Charity fundraiser in the Last Year: Not on file  . Ran Out of Food in the Last Year: Not on file  Transportation Needs:   . Lack of Transportation (Medical): Not on file  . Lack of Transportation (Non-Medical): Not on file  Physical Activity:   . Days of Exercise per Week: Not on file  . Minutes of Exercise per Session: Not on file  Stress:   . Feeling of Stress : Not on file  Social Connections:   . Frequency of Communication with Friends and Family: Not on file  . Frequency of Social Gatherings with Friends and Family: Not on file  . Attends Religious Services: Not on file  . Active Member of Clubs or Organizations: Not on file  . Attends Archivist Meetings: Not on file  .  Marital Status: Not on file   Family History: Family History  Problem Relation Age of Onset  . Alcohol abuse Mother   . Alcohol abuse Other        FAMILY HISTORY  . Arthritis Other        FAMILY HISTORY  . Stroke Other    Allergies: No Known Allergies Medications: See med rec.  Review of Systems: No fevers, chills, night sweats, weight loss, chest pain, or shortness of breath.   Objective:    General: Well Developed, well nourished, and in no acute distress.  Neuro: Alert and oriented x3, extra-ocular muscles intact, sensation grossly intact.  HEENT: Normocephalic, atraumatic, pupils equal round reactive to light, neck supple, no masses, no lymphadenopathy, thyroid nonpalpable.  Skin: Warm and dry, no rashes. Cardiac: Regular rate  and rhythm, no murmurs rubs or gallops, no lower extremity edema.  Respiratory: Clear to auscultation bilaterally. Not using accessory muscles, speaking in full sentences. Right shoulder: Inspection reveals no abnormalities, atrophy or asymmetry. Palpation is normal with no tenderness over AC joint or bicipital groove. ROM is full in all planes. Rotator cuff strength normal throughout. Mildly positive Neer and Hawkin's tests, empty can. Speeds and Yergason's tests normal. No labral pathology noted with negative Obrien's, negative crank, negative clunk, and good stability. Normal scapular function observed. No painful arc and no drop arm sign. No apprehension sign  Impression and Recommendations:    Impingement syndrome, shoulder, right Fairly benign exam, rotator cuff rehab, x-rays, return to see me in a month, injection if no better.   ___________________________________________ [name]. Benjamin Stain, M.D., ABFM., CAQSM. Primary Care and Sports Medicine [facility]  Adjunct Professor of Family Medicine  [employer]

## 2019-02-01 ENCOUNTER — Other Ambulatory Visit: Payer: Self-pay

## 2019-02-01 ENCOUNTER — Ambulatory Visit: Payer: 59 | Admitting: Sports Medicine

## 2019-02-01 ENCOUNTER — Ambulatory Visit (INDEPENDENT_AMBULATORY_CARE_PROVIDER_SITE_OTHER): Payer: 59

## 2019-02-01 DIAGNOSIS — M7541 Impingement syndrome of right shoulder: Secondary | ICD-10-CM | POA: Diagnosis not present

## 2019-02-01 NOTE — Assessment & Plan Note (Addendum)
Benjamin Warren returns, he was doing okay until recent fall, I think he strained his rotator cuff, he continues to have impingement type symptoms. Good motion, I do not think he is fractured anything. Because this represents a worsening of a chronic condition with failure of conservative measures we are going to proceed with a subacromial injection. Return to see me in 1 month.  I did see a defect in the supraspinatus with quite a partial thickness tear supraspinatus, for this reason we are going to proceed with an MRI.

## 2019-02-01 NOTE — Progress Notes (Addendum)
    Procedures performed today:    Procedure: Real-time Ultrasound Guided injection of the right subacromial bursa Device: Samsung HS60  Verbal informed consent obtained.  Time-out conducted.  Noted no overlying erythema, induration, or other signs of local infection.  Skin prepped in a sterile fashion.  Local anesthesia: Topical Ethyl chloride.  With sterile technique and under real time ultrasound guidance:  Noted intact rotator cuff, 1 cc Kenalog 40, 1 cc lidocaine, 1 cc bupivacaine injected easily Completed without difficulty  Pain immediately resolved suggesting accurate placement of the medication.  Advised to call if fevers/chills, erythema, induration, drainage, or persistent bleeding.  Images permanently stored and available for review in the ultrasound unit.  Impression: Technically successful ultrasound guided injection.  Independent interpretation of tests performed by another provider:   None.  Impression and Recommendations:    Impingement syndrome, shoulder, right Benjamin Warren returns, he was doing okay until recent fall, I think he strained his rotator cuff, he continues to have impingement type symptoms. Good motion, I do not think he is fractured anything. Because this represents a worsening of a chronic condition with failure of conservative measures we are going to proceed with a subacromial injection. Return to see me in 1 month.  I did see a defect in the supraspinatus with quite a partial thickness tear supraspinatus, for this reason we are going to proceed with an MRI.    ___________________________________________ [name]. Benjamin Stain, M.D., ABFM., CAQSM. Primary Care and Sports Medicine [facility]  Adjunct Instructor of Family Medicine  [employer]

## 2019-02-01 NOTE — Addendum Note (Signed)
Addended by: Monica Becton on: 02/01/2019 11:54 AM   Modules accepted: Orders

## 2019-02-02 NOTE — Progress Notes (Signed)
Patient wife called and is going to pick up a copy of the MRI from downstairs and take to the appointment tomorrow with Dr. Dion Saucier. No other questions.

## 2019-02-15 ENCOUNTER — Ambulatory Visit: Payer: 59

## 2019-03-01 ENCOUNTER — Ambulatory Visit: Payer: 59 | Admitting: Sports Medicine

## 2019-03-15 ENCOUNTER — Ambulatory Visit: Payer: 59

## 2019-03-22 ENCOUNTER — Telehealth: Payer: Self-pay | Admitting: Internal Medicine

## 2019-03-22 MED ORDER — OMEPRAZOLE 40 MG PO CPDR: DELAYED_RELEASE_CAPSULE | ORAL | 2 refills | Status: DC

## 2019-03-22 NOTE — Telephone Encounter (Signed)
Patient is requesting a refill on the following medication.  omeprazole (PRILOSEC) 40 MG capsule  Pharmacy on file.

## 2019-03-30 ENCOUNTER — Other Ambulatory Visit: Payer: Self-pay

## 2019-03-30 ENCOUNTER — Ambulatory Visit (INDEPENDENT_AMBULATORY_CARE_PROVIDER_SITE_OTHER): Payer: 59 | Admitting: Rehabilitative and Restorative Service Providers"

## 2019-03-30 DIAGNOSIS — M6281 Muscle weakness (generalized): Secondary | ICD-10-CM | POA: Diagnosis not present

## 2019-03-30 DIAGNOSIS — R293 Abnormal posture: Secondary | ICD-10-CM | POA: Diagnosis not present

## 2019-03-30 DIAGNOSIS — M25511 Pain in right shoulder: Secondary | ICD-10-CM

## 2019-03-30 NOTE — Therapy (Signed)
[facility] [address] [place], [place], [address] Phone: [phone]   Fax:  [phone]  Physical Therapy Evaluation  Patient Details  Name: Benjamin Warren MRN: [id number] Date of Birth: May 30, 1960 Referring Provider (PT): Teryl Lucy, MD   Encounter Date: 03/30/2019  PT End of Session - 03/30/19 2154    Visit Number  1    Number of Visits  12    Date for PT Re-Evaluation  05/11/19    PT Start Time  1106    PT Stop Time  1148    PT Time Calculation (min)  42 min       Past Medical History:  Diagnosis Date  . Anxiety 11/21/2016  . Arthritis   . Chronic pain syndrome   . Crepitus of left TMJ on opening of jaw 11/21/2016  . Depression   . Fever blister 11/21/2016  . Fibromyalgia   . GERD (gastroesophageal reflux disease)   . Left rotator cuff tear 10/09/2012  . 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 Sleep apnea    had test-no dr told him he needed cpap  . TMJ syndrome   . Vitamin D deficiency 11/21/2016    Past Surgical History:  Procedure Laterality Date  . ELBOW ARTHROPLASTY  1996   right  . HARDWARE REMOVAL  1997   rt wrist  . ORIF WRIST FRACTURE  1997   right  . SHOULDER ARTHROSCOPY  1996   right  . SHOULDER ARTHROSCOPY WITH ROTATOR CUFF REPAIR AND SUBACROMIAL DECOMPRESSION Left 10/09/2012   Procedure: LEFT SHOULDER ARTHROSCOPY WITH SUBACROMIAL DECOMPRESSION, DISTAL CLAVICLE EXCISION DEBRIDEMENT AND ROTATOR CUFF REPAIR;  Surgeon: Eulas Post, MD;  Location: [facility];  Service: Orthopedics;  Laterality: Left;    There were no vitals filed for this visit.   Subjective Assessment - 03/30/19 1112    Subjective  The patient is s/p R RTC surgery on 02/11/19.  He used sling x 6 weeks and reports he was limited on his motion for the first 6 weeks.  The patient had f/u MD appt on 3/12 with sling d/c.  The patient reports he feels his motion is doing well and he has been lifting things with the  right arm.    Pertinent History  h/o R elbow fracture, h/o R shoulder dislocation with capsular repair, h/o R wrist surgery    Diagnostic tests  --    Patient Stated Goals  get strength back    Currently in Pain?  Yes    Pain Score  0-No pain    Pain Location  Shoulder    Pain Orientation  Right    Aggravating Factors   unsure at this time    Pain Relieving Factors  unsure at this time         [facility] PT Assessment - 03/30/19 1123      Assessment   Medical Diagnosis  R shoulder surgery    Referring Provider (PT)  Teryl Lucy, MD    Onset Date/Surgical Date  02/11/19    Hand Dominance  Right      Precautions   Precautions  Shoulder    Type of Shoulder Precautions  Patient reports he does not have precautions.  *See self care for PT education    Precaution Comments  *PT requesting RTC repair protocol from [facility]      Restrictions   Weight Bearing Restrictions  No      Balance Screen   Has the patient  fallen in the past 6 months  No    Has the patient had a decrease in activity level because of a fear of falling?   No    Is the patient reluctant to leave their home because of a fear of falling?   No      Home Environment   Living Environment  Private residence    Living Arrangements  Children      Prior Function   Level of Independence  Independent      Observation/Other Assessments   Focus on Therapeutic Outcomes (FOTO)   100% (0% limitation subjectively reported by patient).      Sensation   Light Touch  Appears Intact      Posture/Postural Control   Posture/Postural Control  Postural limitations    Postural Limitations  Rounded Shoulders;Forward head      ROM / Strength   AROM / PROM / Strength  AROM;Strength      AROM   Overall AROM   Deficits    Overall AROM Comments  The patient demonstrated to PT that his R arm has equal motion to the left    AROM Assessment Site  Shoulder    Right/Left Shoulder  Right;Left    Right Shoulder Flexion  160 Degrees     Right Shoulder ABduction  160 Degrees    Right Shoulder Internal Rotation  --   to L1 (3" higher on L side)--limited 2/2 prior surgery   Right Shoulder External Rotation  --   can reach behind head     PROM   Overall PROM   --      Strength   Overall Strength  Deficits    Overall Strength Comments  R elbow flexion/extension is 5/5; patient able to tolerate isometrics without pain                Objective measurements completed on examination: See above findings.      [facility] Adult PT Treatment/Exercise - 03/30/19 1123      Self-Care   Self-Care  Other Self-Care Comments    Other Self-Care Comments   PT and patient discussed limiting overhead lifting and only performing isometrics for strengthening at this time.  The patient reports he is trying to limit his use of the R UE and is avoiding loading weight through R UE.      Exercises   Exercises  Shoulder      Shoulder Exercises: Standing   External Rotation  Right    External Rotation Limitations  Standing L for scapular retraction + external rotation    Retraction  Strengthening;Both;10 reps      Shoulder Exercises: Isometric Strengthening   Flexion  3X3"    External Rotation  3X3"    Internal Rotation  3X3"             PT Education - 03/30/19 1122    Education Details  HEP; education re: restricting his motion.    Person(s) Educated  Patient    Methods  Explanation;Demonstration;Handout    Comprehension  Returned demonstration;Verbalized understanding          PT Long Term Goals - 03/30/19 2224      PT LONG TERM GOAL #1   Title  The patient will be indep with HEP for R shoulder strengthening.    Time  6    Period  Weeks    Target Date  05/11/19      PT LONG TERM GOAL #2   Title  The patient will tolerate overhead use of R UE x 1 minute.    Time  6    Period  Weeks    Target Date  05/11/19      PT LONG TERM GOAL #3   Title  The patient will return to playing musical instruments without  pain in R shoulder.    Time  6    Period  Weeks    Target Date  05/11/19      PT LONG TERM GOAL #4   Title  The patient will demonstrate postural upright with scpaular depression for improved position for ADLs and overhead reaching.    Time  6    Period  Weeks    Target Date  05/11/19             Plan - 03/30/19 2231    Clinical Impression Statement  The patient is a 59 yo male s/p R RTC repair 6 weeks ago.  He presents today with nearly full AROM of the R shoulder.  He does not feel he has any functional limitation per functional outcome measures. He also notes he does not want to attend therapy regularly at this time.  PT educated the patient to limit overhead lifting with the R UE at this time and initiated HEP for isometric strengthening.    Personal Factors and Comorbidities  Comorbidity 1    Comorbidities  prior R shoulder surgery    Examination-Activity Limitations  Lift;Reach Overhead    Stability/Clinical Decision Making  Stable/Uncomplicated    Clinical Decision Making  Low    Rehab Potential  Good    PT Frequency  2x / week    PT Duration  6 weeks    PT Treatment/Interventions  ADLs/Self Care Home Management;Iontophoresis 4mg /ml Dexamethasone;Functional mobility training;Therapeutic activities;Therapeutic exercise;Manual techniques;Taping;Patient/family education;Electrical Stimulation;Moist Heat;Cryotherapy    PT Next Visit Plan  Review precautions to avoid overhead resistance training, review isometrics progress shoulder exercises to tolerance    PT Home Exercise Plan  Access Code: [email or web address]    Consulted and Agree with Plan of Care  Patient       Patient will benefit from skilled therapeutic intervention in order to improve the following deficits and impairments:  Decreased range of motion, Decreased strength, Postural dysfunction, Impaired flexibility, Pain  Visit Diagnosis: Muscle weakness (generalized)  Acute pain of right shoulder  Abnormal  posture     Problem List Patient Active Problem List   Diagnosis Date Noted  . Impingement syndrome, shoulder, right 01/12/2019  . Lumbar spinal stenosis 06/29/2018  . Trochanteric bursitis, right hip 07/08/2017  . Primary osteoarthritis of first carpometacarpal joint of left hand 03/18/2017  . Chronic headaches 11/21/2016  . Anxiety 11/21/2016  . 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 Vitamin D deficiency 11/21/2016  . Tremor 10/16/2016  . Insomnia 09/27/2016  . Memory loss 09/27/2016  . 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 Routine general medical examination at a health care facility 11/26/2014  . Primary osteoarthritis of both hands 11/25/2014  . Allergic rhinitis 12/23/2013  . Fibromyalgia   . Chronic pain syndrome   . Depression   . GERD (gastroesophageal reflux disease)   . OSA (obstructive sleep apnea)     Ena Demary, PT 03/31/2019, 9:41 AM  [facility] [address] [place], [place], [place] Phone: [phone]   Fax:  [phone]  Name: Benjamin Warren MRN: [facility] Nurse Date of Birth: 06-11-1960

## 2019-03-30 NOTE — Patient Instructions (Signed)
Access Code: [email or web address] URL: [email or web address] Date: 03/30/2019 Prepared by: Margretta Ditty  Program Notes Avoid heavy lifting and lifting overhead.      Exercises Shoulder External Rotation and Scapular Retraction - 2 x daily - 7 x weekly - 10 reps - 1 sets Isometric Shoulder Flexion at Wall - 2 x daily - 7 x weekly - 1 sets - 10 reps Standing Isometric Shoulder Internal Rotation at Doorway - 2 x daily - 7 x weekly - 10 reps - 1 sets Isometric Shoulder External Rotation at Wall - 2 x daily - 7 x weekly - 10 reps - 1 sets

## 2019-04-12 ENCOUNTER — Encounter: Payer: Self-pay | Admitting: Rehabilitative and Restorative Service Providers"

## 2019-04-12 ENCOUNTER — Encounter: Payer: 59 | Admitting: Rehabilitative and Restorative Service Providers"

## 2019-04-12 ENCOUNTER — Ambulatory Visit (INDEPENDENT_AMBULATORY_CARE_PROVIDER_SITE_OTHER): Payer: 59 | Admitting: Rehabilitative and Restorative Service Providers"

## 2019-04-12 ENCOUNTER — Other Ambulatory Visit: Payer: Self-pay

## 2019-04-12 DIAGNOSIS — M6281 Muscle weakness (generalized): Secondary | ICD-10-CM

## 2019-04-12 DIAGNOSIS — M25511 Pain in right shoulder: Secondary | ICD-10-CM

## 2019-04-12 DIAGNOSIS — R293 Abnormal posture: Secondary | ICD-10-CM

## 2019-04-12 NOTE — Patient Instructions (Signed)
Access Code: [email or web address] URL: [email or web address] Date: 04/12/2019 Prepared by: Margretta Ditty  Program Notes Avoid heavy lifting and lifting overhead.      Exercises Shoulder External Rotation and Scapular Retraction - 2 x daily - 7 x weekly - 10 reps - 1 sets Doorway Pec Stretch at 60 Elevation - 2 x daily - 7 x weekly - 1 sets - 2 reps - 20 seconds hold Isometric Shoulder Flexion at Wall - 2 x daily - 7 x weekly - 1 sets - 10 reps Standing Isometric Shoulder Internal Rotation at Doorway - 2 x daily - 7 x weekly - 10 reps - 1 sets Isometric Shoulder External Rotation at Wall - 2 x daily - 7 x weekly - 10 reps - 1 sets Standing Isometric Shoulder Abduction with Doorway - Arm Bent - 2 x daily - 7 x weekly - 10 reps - 1 sets

## 2019-04-12 NOTE — Therapy (Signed)
[facility] [address] [place] [address] [place], [place], [address] Phone: [phone]   Fax:  [phone]  Physical Therapy Treatment  Patient Details  Name: Benjamin Warren MRN: [id number] Date of Birth: 1960-04-26 Referring Provider (PT): Teryl Lucy, MD   Encounter Date: 04/12/2019  PT End of Session - 04/12/19 1124    Visit Number  2    Number of Visits  12    Date for PT Re-Evaluation  05/11/19    PT Start Time  1108    PT Stop Time  1146    PT Time Calculation (min)  38 min       Past Medical History:  Diagnosis Date  . Anxiety 11/21/2016  . Arthritis   . Chronic pain syndrome   . Crepitus of left TMJ on opening of jaw 11/21/2016  . Depression   . Fever blister 11/21/2016  . Fibromyalgia   . GERD (gastroesophageal reflux disease)   . Left rotator cuff tear 10/09/2012  . 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 Sleep apnea    had test-no dr told him he needed cpap  . TMJ syndrome   . Vitamin D deficiency 11/21/2016    Past Surgical History:  Procedure Laterality Date  . ELBOW ARTHROPLASTY  1996   right  . HARDWARE REMOVAL  1997   rt wrist  . ORIF WRIST FRACTURE  1997   right  . SHOULDER ARTHROSCOPY  1996   right  . SHOULDER ARTHROSCOPY WITH ROTATOR CUFF REPAIR AND SUBACROMIAL DECOMPRESSION Left 10/09/2012   Procedure: LEFT SHOULDER ARTHROSCOPY WITH SUBACROMIAL DECOMPRESSION, DISTAL CLAVICLE EXCISION DEBRIDEMENT AND ROTATOR CUFF REPAIR;  Surgeon: Eulas Post, MD;  Location: [facility];  Service: Orthopedics;  Laterality: Left;    There were no vitals filed for this visit.  Subjective Assessment - 04/12/19 1110    Subjective  The patient reports that he is doing well with his shoulder.  He is doing HEP.    Pertinent History  h/o R elbow fracture, h/o R shoulder dislocation with capsular repair, h/o R wrist surgery    Patient Stated Goals  get strength back    Currently in Pain?  No/denies          [facility] PT Assessment - 04/12/19 1112      Assessment   Medical Diagnosis  R shoulder surgery    Referring Provider (PT)  Teryl Lucy, MD    Onset Date/Surgical Date  02/11/19    Hand Dominance  Right                   [facility] Adult PT Treatment/Exercise - 04/12/19 1113      Self-Care   Self-Care  Other Self-Care Comments    Other Self-Care Comments   reviewed precautions noting no loading extremity to support body weight, avoid excessive movement behind back and no overhead lifting.  Patient reports he has already supported his body weight this morning without difficulty.      Exercises   Exercises  Shoulder      Shoulder Exercises: Supine   Protraction  Strengthening;Right;12 reps    Protraction Weight (lbs)  1    Flexion  AROM;Both;5 reps    Diagonals  AROM;5 reps    Diagonals Limitations  D1    Other Supine Exercises  supine shoulder circles 1 minute each direction , 1 lb CCW and CW      Shoulder Exercises: Seated   Flexion  AROM;Right;10 reps  Other Seated Exercises  dowel circles with dowel on floor CW and CCW x 1 minute each direction      Shoulder Exercises: Prone   Retraction  Strengthening;Right;10 reps    Extension  Strengthening;Right;10 reps    Other Prone Exercises  prone row without weight x 10 reps      Shoulder Exercises: Sidelying   External Rotation  AROM;Right;10 reps      Shoulder Exercises: Standing   External Rotation  Strengthening;Both;10 reps    External Rotation Limitations  Standing "L" x 10 reps    Other Standing Exercises  standing pool noodle squeeze x 12 reps      Shoulder Exercises: Therapy Ball   Other Therapy Ball Exercises  Ball on wall x 1 minute CW and CCW      Shoulder Exercises: Isometric Strengthening   ABduction  5X5"      Shoulder Exercises: Stretch   Corner Stretch  2 reps;20 seconds    Corner Stretch Limitations  arm at 60 degrees in doorway             PT Education - 04/12/19 1144     Education Details  HEP    Person(s) Educated  Patient    Methods  Explanation;Demonstration;Handout    Comprehension  Returned demonstration;Verbalized understanding          PT Long Term Goals - 03/30/19 2224      PT LONG TERM GOAL #1   Title  The patient will be indep with HEP for R shoulder strengthening.    Time  6    Period  Weeks    Target Date  05/11/19      PT LONG TERM GOAL #2   Title  The patient will tolerate overhead use of R UE x 1 minute.    Time  6    Period  Weeks    Target Date  05/11/19      PT LONG TERM GOAL #3   Title  The patient will return to playing musical instruments without pain in R shoulder.    Time  6    Period  Weeks    Target Date  05/11/19      PT LONG TERM GOAL #4   Title  The patient will demonstrate postural upright with scpaular depression for improved position for ADLs and overhead reaching.    Time  6    Period  Weeks    Target Date  05/11/19            Plan - 04/12/19 1227    Clinical Impression Statement  The patient is continuing to tolerate strengthening well and PT encouraging isometrics and scapular stabilization.  Discussed and reviewed precautions.  PT to continue to work towards [place] within guidelines from RTC protocol.    Personal Factors and Comorbidities  Comorbidity 1    Comorbidities  prior R shoulder surgery    Examination-Activity Limitations  Lift;Reach Overhead    Stability/Clinical Decision Making  Stable/Uncomplicated    Rehab Potential  Good    PT Frequency  2x / week    PT Duration  6 weeks    PT Treatment/Interventions  ADLs/Self Care Home Management;Iontophoresis 4mg /ml Dexamethasone;Functional mobility training;Therapeutic activities;Therapeutic exercise;Manual techniques;Taping;Patient/family education;Electrical Stimulation;Moist Heat;Cryotherapy    PT Next Visit Plan  Review precautions to avoid overhead resistance training, review isometrics progress shoulder exercises to tolerance    PT Home  Exercise Plan  Access Code: [email or web address]    Consulted and Agree with Plan  of Care  Patient       Patient will benefit from skilled therapeutic intervention in order to improve the following deficits and impairments:  Decreased range of motion, Decreased strength, Postural dysfunction, Impaired flexibility, Pain  Visit Diagnosis: Muscle weakness (generalized)  Acute pain of right shoulder  Abnormal posture     Problem List Patient Active Problem List   Diagnosis Date Noted  . Impingement syndrome, shoulder, right 01/12/2019  . Lumbar spinal stenosis 06/29/2018  . Trochanteric bursitis, right hip 07/08/2017  . Primary osteoarthritis of first carpometacarpal joint of left hand 03/18/2017  . Chronic headaches 11/21/2016  . Anxiety 11/21/2016  . 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 Vitamin D deficiency 11/21/2016  . Tremor 10/16/2016  . Insomnia 09/27/2016  . Memory loss 09/27/2016  . 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 Routine general medical examination at a health care facility 11/26/2014  . Primary osteoarthritis of both hands 11/25/2014  . Allergic rhinitis 12/23/2013  . Fibromyalgia   . Chronic pain syndrome   . Depression   . GERD (gastroesophageal reflux disease)   . OSA (obstructive sleep apnea)     Ella Golomb, PT 04/12/2019, 12:29 PM  [facility] [address] [place], [place], [address] Phone: [phone]   Fax:  [phone]  Name: ONYX SCHIRMER MRN: [id number] Date of Birth: 14-Jul-1960

## 2019-04-22 ENCOUNTER — Ambulatory Visit: Payer: 59 | Admitting: Rehabilitative and Restorative Service Providers"

## 2019-04-22 ENCOUNTER — Other Ambulatory Visit: Payer: Self-pay

## 2019-04-22 DIAGNOSIS — M6281 Muscle weakness (generalized): Secondary | ICD-10-CM

## 2019-04-22 DIAGNOSIS — R293 Abnormal posture: Secondary | ICD-10-CM | POA: Diagnosis not present

## 2019-04-22 DIAGNOSIS — M25511 Pain in right shoulder: Secondary | ICD-10-CM

## 2019-04-22 NOTE — Therapy (Addendum)
[place], [place], [address] Phone: [phone]   Fax:  [phone]  Physical Therapy Treatment and Discharge Summary  Patient Details  Name: Benjamin Warren MRN: [id number] Date of Birth: 1960-11-19 Referring Provider (PT): Marchia Bond, MD   Encounter Date: 04/22/2019  PT End of Session - 04/22/19 1157    Visit Number  3    Number of Visits  12    Date for PT Re-Evaluation  05/11/19    PT Start Time  1146    PT Stop Time  1225    PT Time Calculation (min)  39 min       Past Medical History:  Diagnosis Date  . Anxiety 11/21/2016  . Arthritis   . Chronic pain syndrome   . Crepitus of left TMJ on opening of jaw 11/21/2016  . Depression   . Fever blister 11/21/2016  . Fibromyalgia   . GERD (gastroesophageal reflux disease)   . Left rotator cuff tear 10/09/2012  . 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 Sleep apnea    had test-no dr told him he needed cpap  . TMJ syndrome   . Vitamin D deficiency 11/21/2016    Past Surgical History:  Procedure Laterality Date  . ELBOW ARTHROPLASTY  1996   right  . [place]   rt wrist  . ORIF WRIST FRACTURE  1997   right  . SHOULDER ARTHROSCOPY  1996   right  . SHOULDER ARTHROSCOPY WITH ROTATOR CUFF REPAIR AND SUBACROMIAL DECOMPRESSION Left 10/09/2012   Procedure: LEFT SHOULDER ARTHROSCOPY WITH SUBACROMIAL DECOMPRESSION, DISTAL CLAVICLE EXCISION DEBRIDEMENT AND ROTATOR CUFF REPAIR;  Surgeon: Johnny Bridge, MD;  Location: [facility];  Service: Orthopedics;  Laterality: Left;    There were no vitals filed for this visit.  Subjective Assessment - 04/22/19 1151    Subjective  The patient reports he got Moderna shot yesterday and feels a little achy.  He feels like his range of motion improved after doing chest stretch in doorway and feels like it is now normal.    Pertinent History  h/o R elbow fracture, h/o R shoulder dislocation with  capsular repair, h/o R wrist surgery    Patient Stated Goals  get strength back    Currently in Pain?  No/denies    Pain Score  0-No pain                       [facility] Adult PT Treatment/Exercise - 04/22/19 1157      Self-Care   Self-Care  Other Self-Care Comments    Other Self-Care Comments   reviewed precautions of supporting body weight with R UE and excessive reaching behind his back.      Exercises   Exercises  Shoulder      Shoulder Exercises: Supine   Protraction  AROM;Strengthening;Right;10 reps    Protraction Weight (lbs)  1    Horizontal ABduction  AROM;Right;5 reps    Horizontal ABduction Limitations  reaching to left shoulder cross body.    External Rotation  PROM;Right;5 reps    External Rotation Limitations  tightness end range (moves to 60 degrees ER with 90 degrees abduction)    Internal Rotation  AAROM    Internal Rotation Limitations  *sliding cane with both hands from gluteal fold superior on hips to tolerance with cues to avoid shoulder shrug    Flexion  AROM;Strengthening;Right;10 reps    Flexion Limitations  also  added rhythmic stabilization at 90 degrees and 60 degrees with submaximal effort for muscle engagement.    Diagonals  AROM;5 reps    Other Supine Exercises  supine shoulder circles 1 minute CW/1 minute CW    Other Supine Exercises  supine towel roll stretch for thoracic extension      Shoulder Exercises: Sidelying   External Rotation  AROM;Right;10 reps    Other Sidelying Exercises  Scapular protraction/retraction R shoulder with manual resistance and tactile cues to avoid shoulder elevation      Shoulder Exercises: Standing   External Rotation  Strengthening;Both;10 reps    External Rotation Limitations  Standing "L" x 10 reps    Flexion  AROM;Strengthening;Right;10 reps    ABduction  AROM;Strengthening;Right    Row  Strengthening;Both;10 reps    Theraband Level (Shoulder Row)  Level 2 (Red)    Retraction  Strengthening;Both;10  reps    Diagonals  Right;AROM;5 reps    Diagonals Limitations  to 90 degrees D2 movement    Shoulder Elevation  Both;10 reps    Shoulder Elevation Limitations  backward rolls in standing    Other Standing Exercises  standing pool noodle squeeze x 12 reps for scapular retraction    Other Standing Exercises  --      Shoulder Exercises: Therapy Ball   Other Therapy Ball Exercises  ball on wall x 1 minute CW/ CCW    Other Therapy Ball Exercises  above 90 lateral rolling ball (at 120) x 10 reps      Shoulder Exercises: Isometric Strengthening   Other Isometric Exercises  Reactive isometrics with red band holding neutral stepping laterally away from anchor for shoulder stability for External and Internal rotators.  Patient continuing isometrics for HEP      Shoulder Exercises: Stretch   Corner Stretch  2 reps;20 seconds    Corner Stretch Limitations  arm at 60 degrees door frame stretch                  PT Long Term Goals - 03/30/19 2224      PT LONG TERM GOAL #1   Title  The patient will be indep with HEP for R shoulder strengthening.    Time  6    Period  Weeks    Target Date  05/11/19      PT LONG TERM GOAL #2   Title  The patient will tolerate overhead use of R UE x 1 minute.    Time  6    Period  Weeks    Target Date  05/11/19      PT LONG TERM GOAL #3   Title  The patient will return to playing musical instruments without pain in R shoulder.    Time  6    Period  Weeks    Target Date  05/11/19      PT LONG TERM GOAL #4   Title  The patient will demonstrate postural upright with scpaular depression for improved position for ADLs and overhead reaching.    Time  6    Period  Weeks    Target Date  05/11/19            Plan - 04/22/19 1227    Clinical Impression Statement  The patient continues to tolerance progression of isometric activities and scapular strengthening.  He needs regular review of precautions for R shoulder.  PT is continuing to progress as  protocol allows and to patient tolerance.    Personal Factors  and Comorbidities  Comorbidity 1    Comorbidities  prior R shoulder surgery    Examination-Activity Limitations  Lift;Reach Overhead    Stability/Clinical Decision Making  Stable/Uncomplicated    Rehab Potential  Good    PT Frequency  2x / week    PT Duration  6 weeks    PT Treatment/Interventions  ADLs/Self Care Home Management;Iontophoresis 44m/ml Dexamethasone;Functional mobility training;Therapeutic activities;Therapeutic exercise;Manual techniques;Taping;Patient/family education;Electrical Stimulation;Moist Heat;Cryotherapy    PT Next Visit Plan  Review precautions to avoid overhead resistance training, review isometrics progress shoulder exercises to tolerance, scapular stabilization, scapular strengthening    PT Home Exercise Plan  Access Code: [email or web address]    Consulted and Agree with Plan of Care  Patient       Patient will benefit from skilled therapeutic intervention in order to improve the following deficits and impairments:  Decreased range of motion, Decreased strength, Postural dysfunction, Impaired flexibility, Pain  Visit Diagnosis: Acute pain of right shoulder  Muscle weakness (generalized)  Abnormal posture     Problem List Patient Active Problem List   Diagnosis Date Noted  . Impingement syndrome, shoulder, right 01/12/2019  . Lumbar spinal stenosis 06/29/2018  . Trochanteric bursitis, right hip 07/08/2017  . Primary osteoarthritis of first carpometacarpal joint of left hand 03/18/2017  . Chronic headaches 11/21/2016  . Anxiety 11/21/2016  . 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 Vitamin D deficiency 11/21/2016  . Tremor 10/16/2016  . Insomnia 09/27/2016  . Memory loss 09/27/2016  . 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 Routine general medical examination at a health care facility 11/26/2014  . Primary osteoarthritis of both hands 11/25/2014  . Allergic  rhinitis 12/23/2013  . Fibromyalgia   . Chronic pain syndrome   . Depression   . GERD (gastroesophageal reflux disease)   . OSA (obstructive sleep apnea)     PHYSICAL THERAPY DISCHARGE SUMMARY  Visits from Start of Care: 3  Current functional level related to goals / functional outcomes: *The patient called to cancel remaining PT appts due to cleared by doctor to d/c therapy.  He was progressing well s/p shoulder surgery.  Remaining deficits: See status above   Education / Equipment: HEP and precautions to limit overhead lifting reviewed.   Plan: Patient agrees to discharge.  Patient goals were not met. Patient is being discharged due to meeting the stated rehab goals.  ?????         Thank you for the referral of this patient. CRudell Cobb MPT  [facility] PT 04/22/2019, 12:35 PM  [facility] [address]Phone: [phone]  Fax:  [phone] Name: Benjamin MALKINMRN: [id number] of Birth: 704-02-1960

## 2019-04-26 ENCOUNTER — Telehealth: Payer: Self-pay | Admitting: Internal Medicine

## 2019-04-26 NOTE — Telephone Encounter (Signed)
    Requesting copy of report from Audiologist Advised spouse to call direct to hearing center

## 2019-04-27 NOTE — Telephone Encounter (Signed)
Patient should call audiology center to get results from them.

## 2019-04-27 NOTE — Telephone Encounter (Signed)
Pt informed of same. Pt will call [facility] to have them send [place] the results.

## 2019-04-29 ENCOUNTER — Encounter: Payer: 59 | Admitting: Rehabilitative and Restorative Service Providers"

## 2019-05-06 ENCOUNTER — Encounter: Payer: 59 | Admitting: Rehabilitative and Restorative Service Providers"

## 2019-06-03 ENCOUNTER — Other Ambulatory Visit: Payer: Self-pay

## 2019-06-03 ENCOUNTER — Ambulatory Visit: Payer: 59 | Admitting: Sports Medicine

## 2019-06-03 ENCOUNTER — Encounter: Payer: Self-pay | Admitting: Sports Medicine

## 2019-06-03 DIAGNOSIS — M19072 Primary osteoarthritis, left ankle and foot: Secondary | ICD-10-CM | POA: Diagnosis not present

## 2019-06-03 DIAGNOSIS — M1812 Unilateral primary osteoarthritis of first carpometacarpal joint, left hand: Secondary | ICD-10-CM

## 2019-06-03 DIAGNOSIS — M48062 Spinal stenosis, lumbar region with neurogenic claudication: Secondary | ICD-10-CM

## 2019-06-03 DIAGNOSIS — M19071 Primary osteoarthritis, right ankle and foot: Secondary | ICD-10-CM

## 2019-06-03 NOTE — Assessment & Plan Note (Signed)
Benjamin Warren last had his left first [facility] injection in October 2020 as well, now having recurrence of pain, injected today, return in a month for this.

## 2019-06-03 NOTE — Assessment & Plan Note (Signed)
Michelle returns, he has known first MTP osteoarthritis, last injected in October 2020, today we injected both sides. Return in a month for this.

## 2019-06-03 NOTE — Assessment & Plan Note (Signed)
Sayre responded very well to an L4-L5 interlaminar epidural last year, now having recurrence of back pain, repeating epidural. He will do this at [facility] imaging and return to see me 1 month later to evaluate relief.

## 2019-06-03 NOTE — Progress Notes (Signed)
    Procedures performed today:    Procedure: Real-time Ultrasound Guided injection of the left first [facility] Device: Samsung HS60  Verbal informed consent obtained.  Time-out conducted.  Noted no overlying erythema, induration, or other signs of local infection.  Skin prepped in a sterile fashion.  Local anesthesia: Topical Ethyl chloride.  With sterile technique and under real time ultrasound guidance:  0.5 cc Kenalog 40, 0.5 cc lidocaine injected easily completed without difficulty  Pain immediately resolved suggesting accurate placement of the medication.  Advised to call if fevers/chills, erythema, induration, drainage, or persistent bleeding.  Images permanently stored and available for review in the ultrasound unit.  Impression: Technically successful ultrasound guided injection.  Procedure: Real-time Ultrasound Guided injection of the left first MTP Device: Samsung HS60  Verbal informed consent obtained.  Time-out conducted.  Noted no overlying erythema, induration, or other signs of local infection.  Skin prepped in a sterile fashion.  Local anesthesia: Topical Ethyl chloride.  With sterile technique and under real time ultrasound guidance:  0.5 cc Kenalog 40, 0.5 cc lidocaine injected easily completed without difficulty  Pain immediately resolved suggesting accurate placement of the medication.  Advised to call if fevers/chills, erythema, induration, drainage, or persistent bleeding.  Images permanently stored and available for review in the ultrasound unit.  Impression: Technically successful ultrasound guided injection.  Procedure: Real-time Ultrasound Guided injection of the Right first MTP Device: Samsung HS60  Verbal informed consent obtained.  Time-out conducted.  Noted no overlying erythema, induration, or other signs of local infection.  Skin prepped in a sterile fashion.  Local anesthesia: Topical Ethyl chloride.  With sterile technique and under real time  ultrasound guidance:  0.5 cc Kenalog 40, 0.5 cc lidocaine injected easily completed without difficulty  Pain immediately resolved suggesting accurate placement of the medication.  Advised to call if fevers/chills, erythema, induration, drainage, or persistent bleeding.  Images permanently stored and available for review in the ultrasound unit.  Impression: Technically successful ultrasound guided injection.  Independent interpretation of notes and tests performed by another provider:   None.  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 Return in a month for this.  Primary osteoarthritis of first carpometacarpal joint of left hand Benjamin Warren last had his left first [facility] injection in October 2020 as well, now having recurrence of pain, injected today, return in a month for this.  Lumbar spinal stenosis Benjamin Warren responded very well to an L4-L5 interlaminar epidural last year, now having recurrence of back pain, repeating epidural. He will do this at [facility] imaging and return to see me 1 month later to evaluate relief.    ___________________________________________ [name]. Benjamin Stain, M.D., ABFM., CAQSM. Primary Care and Sports Medicine [facility]  Adjunct Instructor of Family Medicine  [employer]

## 2019-06-18 ENCOUNTER — Other Ambulatory Visit: Payer: Self-pay

## 2019-06-18 ENCOUNTER — Other Ambulatory Visit: Payer: 59

## 2019-06-18 ENCOUNTER — Ambulatory Visit
Admission: RE | Admit: 2019-06-18 | Discharge: 2019-06-18 | Disposition: A | Discharge status: Home / Self Care | Payer: 59 | Source: Ambulatory Visit | Attending: Sports Medicine | Admitting: Sports Medicine

## 2019-06-18 DIAGNOSIS — M48062 Spinal stenosis, lumbar region with neurogenic claudication: Secondary | ICD-10-CM

## 2019-06-18 MED ORDER — METHYLPREDNISOLONE ACETATE 40 MG/ML INJ SUSP (RADIOLOG
120.0000 mg | Freq: Once | INTRAMUSCULAR | Status: AC
  Administered 2019-06-18: 120 mg via EPIDURAL

## 2019-06-18 MED ORDER — IOPAMIDOL (ISOVUE-M 200) INJECTION 41%
1.0000 mL | Freq: Once | INTRAMUSCULAR | Status: AC
  Administered 2019-06-18: 1 mL via EPIDURAL

## 2019-06-18 NOTE — Discharge Instructions (Signed)

## 2019-08-03 ENCOUNTER — Encounter: Payer: Self-pay | Admitting: Sports Medicine

## 2019-08-03 ENCOUNTER — Ambulatory Visit (INDEPENDENT_AMBULATORY_CARE_PROVIDER_SITE_OTHER): Payer: 59 | Admitting: Sports Medicine

## 2019-08-03 ENCOUNTER — Other Ambulatory Visit: Payer: Self-pay | Admitting: Sports Medicine

## 2019-08-03 DIAGNOSIS — S86819A Strain of other muscle(s) and tendon(s) at lower leg level, unspecified leg, initial encounter: Secondary | ICD-10-CM | POA: Diagnosis not present

## 2019-08-03 DIAGNOSIS — I739 Peripheral vascular disease, unspecified: Secondary | ICD-10-CM | POA: Insufficient documentation

## 2019-08-03 DIAGNOSIS — R252 Cramp and spasm: Secondary | ICD-10-CM | POA: Insufficient documentation

## 2019-08-03 DIAGNOSIS — G729 Myopathy, unspecified: Secondary | ICD-10-CM

## 2019-08-03 NOTE — Assessment & Plan Note (Signed)
This is a very pleasant 59 year old male, he and his wife were cleaning out the backyard doing some yard work, afterwards he started to have pain in his calf, right worse than left, worse with prolonged weightbearing. He does have bilateral calf tenderness but a negative Homans' sign, no swelling. We strapped the calfs bilaterally with compressive dressing and added bilateral heel lifts, I am also going to add some calf rehab exercises, return to see me in a month, if no better we will likely consider a radicular source of his discomfort.

## 2019-08-03 NOTE — Progress Notes (Signed)
    Procedures performed today:    Ankles are strapped with a compressive dressing bilaterally  Independent interpretation of notes and tests performed by another provider:   None.  Brief History, Exam, Impression, and Recommendations:    Strain of calf muscle This is a very pleasant 59 year old male, he and his wife were cleaning out the backyard doing some yard work, afterwards he started to have pain in his calf, right worse than left, worse with prolonged weightbearing. He does have bilateral calf tenderness but a negative Homans' sign, no swelling. We strapped the calfs bilaterally with compressive dressing and added bilateral heel lifts, I am also going to add some calf rehab exercises, return to see me in a month, if no better we will likely consider a radicular source of his discomfort.    ___________________________________________ [name]. Benjamin Stain, M.D., ABFM., CAQSM. Primary Care and Sports Medicine [facility]  Adjunct Instructor of Family Medicine  [employer]

## 2019-08-05 ENCOUNTER — Telehealth: Payer: Self-pay

## 2019-08-05 NOTE — Telephone Encounter (Signed)
Patient's wife has dropped off renewal parking placard.  Form has been completed and placed in providers box to review and sign. Made wife aware Dr.Crawford is out of office until 08/09/19.  They would like to pick up the form once completed.

## 2019-08-05 NOTE — Telephone Encounter (Signed)
New message    Per patient will have his wife drop off the handicap form will be dropped off at the office for Dr. Okey Warren to fill out aware Dr. Okey Warren is on vacation will return next week.

## 2019-08-05 NOTE — Telephone Encounter (Signed)
Noted  

## 2019-08-09 NOTE — Telephone Encounter (Signed)
Informed patient the form is ready to be picked up.   Copy sent to scan.

## 2019-08-31 ENCOUNTER — Other Ambulatory Visit: Payer: Self-pay

## 2019-08-31 ENCOUNTER — Ambulatory Visit (INDEPENDENT_AMBULATORY_CARE_PROVIDER_SITE_OTHER): Payer: 59 | Admitting: Sports Medicine

## 2019-08-31 ENCOUNTER — Encounter: Payer: Self-pay | Admitting: Sports Medicine

## 2019-08-31 DIAGNOSIS — I739 Peripheral vascular disease, unspecified: Secondary | ICD-10-CM | POA: Diagnosis not present

## 2019-08-31 DIAGNOSIS — R252 Cramp and spasm: Secondary | ICD-10-CM

## 2019-08-31 LAB — CBC
HCT: 44.9 % (ref 38.5–50.0)
Hemoglobin: 15 g/dL (ref 13.2–17.1)
MCH: 31.7 pg (ref 27.0–33.0)
MCHC: 33.4 g/dL (ref 32.0–36.0)
MCV: 94.9 fL (ref 80.0–100.0)
MPV: 9.7 fL (ref 7.5–12.5)
Platelets: 301 10*3/uL (ref 140–400)
RBC: 4.73 10*6/uL (ref 4.20–5.80)
RDW: 12.3 % (ref 11.0–15.0)
WBC: 8.6 10*3/uL (ref 3.8–10.8)

## 2019-08-31 LAB — D-DIMER, QUANTITATIVE: D-Dimer, Quant: 0.44 mcg/mL FEU (ref <0.50)

## 2019-08-31 NOTE — Progress Notes (Addendum)
    Procedures performed today:    None.  Independent interpretation of notes and tests performed by another provider:   None.  Brief History, Exam, Impression, and Recommendations:    Peripheral arterial disease (HCC) Benjamin Warren is a pleasant 59 year old male, he has been having calf cramping after doing some work in the yard last month. We treated him conservatively for gastrocnemius strain with heel lifts, compression. Unfortunately he has not improved all that much. Pain is bilateral, right worse than left, cramping when walking, certainly we need to rule out a vascular source of claudication before blaming this on neurogenic claudication. Adding ABIs. In addition he does have a positive Denna Haggard' sign today so we are going to check a D-dimer, if abnormal we will get bilateral vascular ultrasounds but due to the critical staffing shortage in ultrasound and [place] we will not start with the vascular ultrasound.  There is significant right lower extremity peripheral arterial disease with abnormal ankle-brachial index, we can treat this initially with rosuvastatin and Pletal.  If he does not get significant improvement over the next 3 months then I would certainly consider referral to vascular surgery and consideration of CT angiography of the lower extremities looking for a stentable lesion.  No signs or symptoms of critical limb ischemia so I think we have time to treat this.    ___________________________________________ [name]. Benjamin Stain, M.D., ABFM., CAQSM. Primary Care and Sports Medicine [place]  Adjunct Instructor of Family Medicine  [employer]

## 2019-08-31 NOTE — Assessment & Plan Note (Addendum)
Pratyush is a pleasant 59 year old male, he has been having calf cramping after doing some work in the yard last month. We treated him conservatively for gastrocnemius strain with heel lifts, compression. Unfortunately he has not improved all that much. Pain is bilateral, right worse than left, cramping when walking, certainly we need to rule out a vascular source of claudication before blaming this on neurogenic claudication. Adding ABIs. In addition he does have a positive Denna Haggard' sign today so we are going to check a D-dimer, if abnormal we will get bilateral vascular ultrasounds but due to the critical staffing shortage in ultrasound and [place] we will not start with the vascular ultrasound.  There is significant right lower extremity peripheral arterial disease with abnormal ankle-brachial index, we can treat this initially with rosuvastatin and Pletal.  If he does not get significant improvement over the next 3 months then I would certainly consider referral to vascular surgery and consideration of CT angiography of the lower extremities looking for a stentable lesion.  No signs or symptoms of critical limb ischemia so I think we have time to treat this.

## 2019-09-14 ENCOUNTER — Other Ambulatory Visit: Payer: Self-pay | Admitting: Internal Medicine

## 2019-09-21 ENCOUNTER — Other Ambulatory Visit: Payer: Self-pay | Admitting: Sports Medicine

## 2019-09-21 DIAGNOSIS — M48062 Spinal stenosis, lumbar region with neurogenic claudication: Secondary | ICD-10-CM

## 2019-09-24 ENCOUNTER — Ambulatory Visit
Admission: RE | Admit: 2019-09-24 | Discharge: 2019-09-24 | Disposition: A | Discharge status: Home / Self Care | Payer: 59 | Source: Ambulatory Visit | Attending: Sports Medicine | Admitting: Sports Medicine

## 2019-09-24 ENCOUNTER — Ambulatory Visit (HOSPITAL_BASED_OUTPATIENT_CLINIC_OR_DEPARTMENT_OTHER): Admission: RE | Admit: 2019-09-24 | Payer: 59 | Source: Ambulatory Visit

## 2019-09-24 ENCOUNTER — Ambulatory Visit (HOSPITAL_COMMUNITY)
Admission: RE | Admit: 2019-09-24 | Discharge: 2019-09-24 | Disposition: A | Discharge status: Home / Self Care | Payer: 59 | Source: Ambulatory Visit | Attending: Sports Medicine | Admitting: Sports Medicine

## 2019-09-24 ENCOUNTER — Other Ambulatory Visit: Payer: 59

## 2019-09-24 ENCOUNTER — Other Ambulatory Visit: Payer: Self-pay

## 2019-09-24 DIAGNOSIS — M48062 Spinal stenosis, lumbar region with neurogenic claudication: Secondary | ICD-10-CM

## 2019-09-24 DIAGNOSIS — R252 Cramp and spasm: Secondary | ICD-10-CM | POA: Insufficient documentation

## 2019-09-24 MED ORDER — METHYLPREDNISOLONE ACETATE 40 MG/ML INJ SUSP (RADIOLOG
120.0000 mg | Freq: Once | INTRAMUSCULAR | Status: AC
  Administered 2019-09-24: 120 mg via EPIDURAL

## 2019-09-24 MED ORDER — IOPAMIDOL (ISOVUE-M 200) INJECTION 41%
1.0000 mL | Freq: Once | INTRAMUSCULAR | Status: AC
  Administered 2019-09-24: 1 mL via EPIDURAL

## 2019-09-24 NOTE — Progress Notes (Signed)
ABI has been completed.   Preliminary results in CV Proc.   Blanch Media 09/24/2019 11:09 AM

## 2019-09-24 NOTE — Discharge Instructions (Signed)

## 2019-09-27 MED ORDER — CILOSTAZOL 100 MG PO TABS: 100.0000 mg | ORAL_TABLET | Freq: Two times a day (BID) | ORAL | 11 refills | Status: DC

## 2019-09-27 MED ORDER — ROSUVASTATIN CALCIUM 20 MG PO TABS: 20.0000 mg | ORAL_TABLET | Freq: Every day | ORAL | 3 refills | Status: DC

## 2019-09-27 NOTE — Telephone Encounter (Signed)
Patient going to discuss with Dr T at appt next week

## 2019-09-27 NOTE — Addendum Note (Signed)
Addended by: Monica Becton on: 09/27/2019 01:07 PM   Modules accepted: Orders

## 2019-10-05 ENCOUNTER — Ambulatory Visit (INDEPENDENT_AMBULATORY_CARE_PROVIDER_SITE_OTHER): Payer: 59 | Admitting: Sports Medicine

## 2019-10-05 DIAGNOSIS — I739 Peripheral vascular disease, unspecified: Secondary | ICD-10-CM | POA: Diagnosis not present

## 2019-10-05 DIAGNOSIS — M1812 Unilateral primary osteoarthritis of first carpometacarpal joint, left hand: Secondary | ICD-10-CM

## 2019-10-05 NOTE — Assessment & Plan Note (Signed)
Benjamin Warren does have severe [facility] osteoarthritis, last injected in May 2021. He has decreased the soda in his diet and has noted improvements in pain, no injections needed today, I have recommended that they follow somewhat more of a Mediterranean diet.

## 2019-10-05 NOTE — Patient Instructions (Signed)
Mediterranean Diet A Mediterranean diet refers to food and lifestyle choices that are based on the traditions of countries located on the [place]. This way of eating has been shown to help prevent certain conditions and improve outcomes for people who have chronic diseases, like kidney disease and heart disease. What are tips for following this plan? Lifestyle  Cook and eat meals together with your family, when possible.  Drink enough fluid to keep your urine clear or pale yellow.  Be physically active every day. This includes: ? Aerobic exercise like running or swimming. ? Leisure activities like gardening, walking, or housework.  Get 7-8 hours of sleep each night.  If recommended by your health care provider, drink red wine in moderation. This means 1 glass a day for nonpregnant women and 2 glasses a day for men. A glass of wine equals 5 oz (150 mL). Reading food labels  Check the serving size of packaged foods. For foods such as rice and pasta, the serving size refers to the amount of cooked product, not dry.  Check the total fat in packaged foods. Avoid foods that have saturated fat or trans fats.  Check the ingredients list for added sugars, such as corn syrup.   Shopping  At the grocery store, buy most of your food from the areas near the walls of the store. This includes: ? Fresh fruits and vegetables (produce). ? Grains, beans, nuts, and seeds. Some of these may be available in unpackaged forms or large amounts (in bulk). ? Fresh seafood. ? Poultry and eggs. ? Low-fat dairy products.  Buy whole ingredients instead of prepackaged foods.  Buy fresh fruits and vegetables in-season from local farmers markets.  Buy frozen fruits and vegetables in resealable bags.  If you do not have access to quality fresh seafood, buy precooked frozen shrimp or canned fish, such as tuna, salmon, or sardines.  Buy small amounts of raw or cooked vegetables, salads, or olives from  the deli or salad bar at your store.  Stock your pantry so you always have certain foods on hand, such as olive oil, canned tuna, canned tomatoes, rice, pasta, and beans. Cooking  Cook foods with extra-virgin olive oil instead of using butter or other vegetable oils.  Have meat as a side dish, and have vegetables or grains as your main dish. This means having meat in small portions or adding small amounts of meat to foods like pasta or stew.  Use beans or vegetables instead of meat in common dishes like chili or lasagna.  Experiment with different cooking methods. Try roasting or broiling vegetables instead of steaming or sauteing them.  Add frozen vegetables to soups, stews, pasta, or rice.  Add nuts or seeds for added healthy fat at each meal. You can add these to yogurt, salads, or vegetable dishes.  Marinate fish or vegetables using olive oil, lemon juice, garlic, and fresh herbs. Meal planning  Plan to eat 1 vegetarian meal one day each week. Try to work up to 2 vegetarian meals, if possible.  Eat seafood 2 or more times a week.  Have healthy snacks readily available, such as: ? Vegetable sticks with hummus. ? Greek yogurt. ? Fruit and nut trail mix.  Eat balanced meals throughout the week. This includes: ? Fruit: 2-3 servings a day ? Vegetables: 4-5 servings a day ? Low-fat dairy: 2 servings a day ? Fish, poultry, or lean meat: 1 serving a day ? Beans and legumes: 2 or more servings a week ?   Nuts and seeds: 1-2 servings a day ? Whole grains: 6-8 servings a day ? Extra-virgin olive oil: 3-4 servings a day  Limit red meat and sweets to only a few servings a month   What are my food choices?  Mediterranean diet ? Recommended  Grains: Whole-grain pasta. Brown rice. Bulgar wheat. Polenta. Couscous. Whole-wheat bread. Oatmeal. Quinoa.  Vegetables: Artichokes. Beets. Broccoli. Cabbage. Carrots. Eggplant. Green beans. Chard. Kale. Spinach. Onions. Leeks. Peas. Squash.  Tomatoes. Peppers. Radishes.  Fruits: Apples. Apricots. Avocado. Berries. Bananas. Cherries. Dates. Figs. Grapes. Lemons. Melon. Oranges. Peaches. Plums. Pomegranate.  Meats and other protein foods: Beans. Almonds. Sunflower seeds. Pine nuts. Peanuts. Cod. Salmon. Scallops. Shrimp. Tuna. Tilapia. Clams. Oysters. Eggs.  Dairy: Low-fat milk. Cheese. Greek yogurt.  Beverages: Water. Red wine. Herbal tea.  Fats and oils: Extra virgin olive oil. Avocado oil. Grape seed oil.  Sweets and desserts: Greek yogurt with honey. Baked apples. Poached pears. Trail mix.  Seasoning and other foods: Basil. Cilantro. Coriander. Cumin. Mint. Parsley. Sage. Rosemary. Tarragon. Garlic. Oregano. Thyme. Pepper. Balsalmic vinegar. Tahini. Hummus. Tomato sauce. Olives. Mushrooms. ? Limit these  Grains: Prepackaged pasta or rice dishes. Prepackaged cereal with added sugar.  Vegetables: Deep fried potatoes (french fries).  Fruits: Fruit canned in syrup.  Meats and other protein foods: Beef. Pork. Lamb. Poultry with skin. Hot dogs. Bacon.  Dairy: Ice cream. Sour cream. Whole milk.  Beverages: Juice. Sugar-sweetened soft drinks. Beer. Liquor and spirits.  Fats and oils: Butter. Canola oil. Vegetable oil. Beef fat (tallow). Lard.  Sweets and desserts: Cookies. Cakes. Pies. Candy.  Seasoning and other foods: Mayonnaise. Premade sauces and marinades. The items listed may not be a complete list. Talk with your dietitian about what dietary choices are right for you. Summary  The Mediterranean diet includes both food and lifestyle choices.  Eat a variety of fresh fruits and vegetables, beans, nuts, seeds, and whole grains.  Limit the amount of red meat and sweets that you eat.  Talk with your health care provider about whether it is safe for you to drink red wine in moderation. This means 1 glass a day for nonpregnant women and 2 glasses a day for men. A glass of wine equals 5 oz (150 mL). This information  is not intended to replace advice given to you by your health care provider. Make sure you discuss any questions you have with your health care provider. Document Revised: 08/31/2015 Document Reviewed: 08/24/2015 Elsevier Patient Education  2020 Elsevier Inc.  

## 2019-10-05 NOTE — Progress Notes (Signed)
    Procedures performed today:    None.  Independent interpretation of notes and tests performed by another provider:   None.  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 He did have an abnormal ABI on the right lower extremity, we added rosuvastatin and Pletal. He has not yet started these, I would like him to be on this for a solid month before considering further intervention, certainly if he does not get improvement with Pletal we will consider referral to vascular surgery and CT angiography aortobifemoral with bilateral lower extremity runoff to look for a stentable lesion.   Primary osteoarthritis of first carpometacarpal joint of left hand Benjamin Warren does have severe CMC osteoarthritis, last injected in May 2021. He has decreased the soda in his diet and has noted improvements in pain, no injections needed today, I have recommended that they follow somewhat more of a Mediterranean diet.    ___________________________________________ [name]. Benjamin Stain, M.D., ABFM., CAQSM. Primary Care and Sports Medicine [facility]  Adjunct Instructor of Family Medicine  [employer]

## 2019-10-05 NOTE — Assessment & Plan Note (Signed)
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 He did have an abnormal ABI on the right lower extremity, we added rosuvastatin and Pletal. He has not yet started these, I would like him to be on this for a solid month before considering further intervention, certainly if he does not get improvement with Pletal we will consider referral to vascular surgery and CT angiography aortobifemoral with bilateral lower extremity runoff to look for a stentable lesion.

## 2019-10-14 ENCOUNTER — Telehealth: Payer: Self-pay

## 2019-10-14 NOTE — Telephone Encounter (Signed)
Patient advised.

## 2019-10-14 NOTE — Telephone Encounter (Signed)
Benjamin Warren read the medication information sheet. It reads to take after a meal unless advised differently by your doctor. He wants to know if he should eat a regular meal or can it be a light meal or no meal at all. Please advise.

## 2019-10-14 NOTE — Telephone Encounter (Signed)
It can be a regular meal.

## 2019-11-23 ENCOUNTER — Ambulatory Visit: Payer: 59 | Admitting: Sports Medicine

## 2019-11-29 ENCOUNTER — Other Ambulatory Visit: Payer: Self-pay | Admitting: Sports Medicine

## 2019-11-29 DIAGNOSIS — M48061 Spinal stenosis, lumbar region without neurogenic claudication: Secondary | ICD-10-CM

## 2019-12-03 ENCOUNTER — Ambulatory Visit (INDEPENDENT_AMBULATORY_CARE_PROVIDER_SITE_OTHER): Payer: 59 | Admitting: Sports Medicine

## 2019-12-03 ENCOUNTER — Inpatient Hospital Stay: Admission: RE | Admit: 2019-12-03 | Payer: 59 | Source: Ambulatory Visit

## 2019-12-03 ENCOUNTER — Other Ambulatory Visit: Payer: Self-pay

## 2019-12-03 DIAGNOSIS — I739 Peripheral vascular disease, unspecified: Secondary | ICD-10-CM

## 2019-12-03 NOTE — Progress Notes (Addendum)
    Procedures performed today:    None.  Independent interpretation of notes and tests performed by another provider:   None.  Brief History, Exam, Impression, and Recommendations:    Peripheral arterial disease (HCC) Doing really good on Pletal and rosuvastatin, he did have abnormal ABIs particularly on the right. We do need to discontinue this for 4 days prior to his epidural. Further follow-up of this should be with his PCP.    ___________________________________________ [name]. Benjamin Stain, M.D., ABFM., CAQSM. Primary Care and Sports Medicine [place] [facility]  Adjunct Instructor of Family Medicine  [employer]

## 2019-12-03 NOTE — Assessment & Plan Note (Addendum)
Doing really good on Pletal and rosuvastatin, he did have abnormal ABIs particularly on the right. We do need to discontinue this for 4 days prior to his epidural. Further follow-up of this should be with his PCP.

## 2019-12-06 ENCOUNTER — Telehealth: Payer: Self-pay | Admitting: Internal Medicine

## 2019-12-06 NOTE — Telephone Encounter (Signed)
Patient has been trying to get in touch with [facility] since last week and cant get someone to call him back so he can get his epidural injection scheduled

## 2019-12-06 NOTE — Telephone Encounter (Signed)
Left message with Lynden Ang at [facility] to contact patient to schedule.

## 2019-12-14 ENCOUNTER — Other Ambulatory Visit: Payer: Self-pay | Admitting: Internal Medicine

## 2019-12-15 ENCOUNTER — Ambulatory Visit
Admission: RE | Admit: 2019-12-15 | Discharge: 2019-12-15 | Disposition: A | Discharge status: Home / Self Care | Payer: 59 | Source: Ambulatory Visit | Attending: Sports Medicine | Admitting: Sports Medicine

## 2019-12-15 ENCOUNTER — Other Ambulatory Visit: Payer: Self-pay

## 2019-12-15 DIAGNOSIS — M48061 Spinal stenosis, lumbar region without neurogenic claudication: Secondary | ICD-10-CM

## 2019-12-15 MED ORDER — METHYLPREDNISOLONE ACETATE 40 MG/ML INJ SUSP (RADIOLOG
120.0000 mg | Freq: Once | INTRAMUSCULAR | Status: AC
  Administered 2019-12-15: 120 mg via EPIDURAL

## 2019-12-15 MED ORDER — IOPAMIDOL (ISOVUE-M 200) INJECTION 41%
1.0000 mL | Freq: Once | INTRAMUSCULAR | Status: AC
  Administered 2019-12-15: 1 mL via EPIDURAL

## 2019-12-15 NOTE — Discharge Instructions (Signed)

## 2019-12-20 ENCOUNTER — Encounter: Payer: Self-pay | Admitting: Sports Medicine

## 2019-12-23 ENCOUNTER — Ambulatory Visit (INDEPENDENT_AMBULATORY_CARE_PROVIDER_SITE_OTHER): Payer: 59 | Admitting: Family Medicine

## 2019-12-23 ENCOUNTER — Encounter: Payer: Self-pay | Admitting: Family Medicine

## 2019-12-23 VITALS — BP 128/55 | HR 60 | Temp 97.9°F | Ht 68.9 in | Wt 186.0 lb

## 2019-12-23 DIAGNOSIS — F419 Anxiety disorder, unspecified: Secondary | ICD-10-CM

## 2019-12-23 DIAGNOSIS — I739 Peripheral vascular disease, unspecified: Secondary | ICD-10-CM

## 2019-12-23 DIAGNOSIS — M2550 Pain in unspecified joint: Secondary | ICD-10-CM

## 2019-12-23 DIAGNOSIS — F331 Major depressive disorder, recurrent, moderate: Secondary | ICD-10-CM | POA: Diagnosis not present

## 2019-12-23 NOTE — Patient Instructions (Signed)
Very nice to meet you today! Please return for an annual exam with labs in the next few weeks We'll update labs at this visit.

## 2019-12-26 NOTE — Progress Notes (Signed)
Benjamin Warren - 59 y.o. male MRN [id number]  Date of birth: 04-06-1960  Subjective Chief Complaint  Patient presents with  . Establish Care    HPI Benjamin Warren is a 59 y.o. male here today for initial visit.  He has a history of hypertension, polyarthritis/chronic pain, hyperlipidemia, peripheral arterial disease, and depression with anxiety.  He has a history of peripheral arterial disease.  He had a ABI completed in September showing moderate disease of the right lower extremity.  He was started on Pletal as well as aggressive treatment of his hyperlipidemia with Crestor.  He is tolerating these well at this time.  He has had some improvement in claudication symptoms with this.  He has history of polyarthralgia/chronic pain for several years.  History of significantly elevated rheumatoid factor in the past with negative CCP.  He has seen rheumatology and was on methotrexate for some time however did not have significant improvement of his symptoms with this.  He reports that symptoms all started after receiving injections into his head for management of his migraines.  He does not recall what injections he received but he does state that a rheumatologist that he had seen thought there may be a reaction to a protein in the injection.  He was eventually diagnosed with fibromyalgia but still think there may be something more going on.   He is seen by an outside mental health provider for management of depression and anxiety.  This is managed with lamictal and diazepam.    ROS:  A comprehensive ROS was completed and negative except as noted per HPI   No Known Allergies  Past Medical History:  Diagnosis Date  . Anxiety 11/21/2016  . Arthritis   . Chronic pain syndrome   . Crepitus of left TMJ on opening of jaw 11/21/2016  . Depression   . Fever blister 11/21/2016  . Fibromyalgia   . GERD (gastroesophageal reflux disease)   . Left rotator cuff tear 10/09/2012  . Male hypogonadism 11/21/2016   . Sleep apnea    had test-no dr told him he needed cpap  . TMJ syndrome   . Vitamin D deficiency 11/21/2016    Past Surgical History:  Procedure Laterality Date  . ELBOW ARTHROPLASTY  1996   right  . HARDWARE REMOVAL  1997   rt wrist  . ORIF WRIST FRACTURE  1997   right  . SHOULDER ARTHROSCOPY  1996   right  . SHOULDER ARTHROSCOPY WITH ROTATOR CUFF REPAIR AND SUBACROMIAL DECOMPRESSION Left 10/09/2012   Procedure: LEFT SHOULDER ARTHROSCOPY WITH SUBACROMIAL DECOMPRESSION, DISTAL CLAVICLE EXCISION DEBRIDEMENT AND ROTATOR CUFF REPAIR;  Surgeon: Eulas Post, MD;  Location: [facility];  Service: Orthopedics;  Laterality: Left;  Marland Kitchen VASECTOMY      Social History   Socioeconomic History  . Marital status: Married    Spouse name: Not on file  . Number of children: Not on file  . Years of education: Not on file  . Highest education level: Not on file  Occupational History  . Not on file  Tobacco Use  . Smoking status: Former Smoker    Packs/day: 0.50    Years: 6.00    Pack years: 3.00    Types: Cigarettes    Quit date: 10/03/1982    Years since quitting: 37.2  . Smokeless tobacco: Never Used  Vaping Use  . Vaping Use: Never used  Substance and Sexual Activity  . Alcohol use: Yes    Comment: occ  .  Drug use: Never  . Sexual activity: Yes    Birth control/protection: Post-menopausal  Other Topics Concern  . Not on file  Social History Narrative  . Not on file   Social Determinants of Health   Financial Resource Strain: Not on file  Food Insecurity: Not on file  Transportation Needs: Not on file  Physical Activity: Not on file  Stress: Not on file  Social Connections: Not on file    Family History  Problem Relation Age of Onset  . Alcohol abuse Mother   . Hypertension Mother   . Alcohol abuse Other        FAMILY HISTORY  . Arthritis Other        FAMILY HISTORY  . Stroke Other   . Pancreatic cancer Brother   . Liver cancer Brother      Health Maintenance  Topic Date Due  . COVID-19 Vaccine (1) Never done  . INFLUENZA VACCINE  08/15/2019  . COLONOSCOPY  10/15/2023  . TETANUS/TDAP  06/23/2026  . Hepatitis C Screening  Completed  . HIV Screening  Completed     ----------------------------------------------------------------------------------------------------------------------------------------------------------------------------------------------------------------- Physical Exam BP (!) 128/55 (BP Location: Left Arm, Patient Position: Sitting, Cuff Size: Normal)   Pulse 60   Temp 97.9 F (36.6 C)   Ht 5' 8.9" (1.75 m)   Wt 186 lb (84.4 kg)   SpO2 99%   BMI 27.55 kg/m   Physical Exam Constitutional:      Appearance: Normal appearance.  Musculoskeletal:     Cervical back: Neck supple.  Skin:    General: Skin is warm and dry.  Neurological:     General: No focal deficit present.     Mental Status: He is alert.  Psychiatric:        Mood and Affect: Mood normal.        Behavior: Behavior normal.     ------------------------------------------------------------------------------------------------------------------------------------------------------------------------------------------------------------------- Assessment and Plan  Polyarthralgia Unclear etiology.  Working diagnosis is fibromyalgia. Will update labs a upcoming CPE and recheck inflammatory markers.   Depression He will continue to see his outside mental health provider for management of depression and anxiety.  Stable at this time.  Peripheral arterial disease (HCC) He has some improvement with claudication symptoms since starting Pletal.  He will continue this as well as aggressive management of cholesterol with rosuvastatin.   No orders of the defined types were placed in this encounter.   No follow-ups on file.    This visit occurred during the SARS-CoV-2 public health emergency.  Safety protocols were in place, including  screening questions prior to the visit, additional usage of staff PPE, and extensive cleaning of exam room while observing appropriate contact time as indicated for disinfecting solutions.

## 2019-12-26 NOTE — Assessment & Plan Note (Signed)
He has some improvement with claudication symptoms since starting Pletal.  He will continue this as well as aggressive management of cholesterol with rosuvastatin.

## 2019-12-26 NOTE — Assessment & Plan Note (Signed)
Unclear etiology.  Working diagnosis is fibromyalgia. Will update labs a upcoming CPE and recheck inflammatory markers.

## 2019-12-26 NOTE — Assessment & Plan Note (Signed)
He will continue to see his outside mental health provider for management of depression and anxiety.  Stable at this time.

## 2020-01-03 ENCOUNTER — Ambulatory Visit (INDEPENDENT_AMBULATORY_CARE_PROVIDER_SITE_OTHER): Payer: 59 | Admitting: Family Medicine

## 2020-01-03 ENCOUNTER — Encounter: Payer: Self-pay | Admitting: Family Medicine

## 2020-01-03 ENCOUNTER — Other Ambulatory Visit: Payer: Self-pay

## 2020-01-03 VITALS — BP 110/68 | HR 65 | Temp 97.7°F | Wt 185.6 lb

## 2020-01-03 DIAGNOSIS — Z125 Encounter for screening for malignant neoplasm of prostate: Secondary | ICD-10-CM | POA: Diagnosis not present

## 2020-01-03 DIAGNOSIS — Z13220 Encounter for screening for lipoid disorders: Secondary | ICD-10-CM

## 2020-01-03 DIAGNOSIS — Z0000 Encounter for general adult medical examination without abnormal findings: Secondary | ICD-10-CM | POA: Diagnosis not present

## 2020-01-03 LAB — COMPLETE METABOLIC PANEL WITH GFR
AG Ratio: 1.8 (calc) (ref 1.0–2.5)
ALT: 18 U/L (ref 9–46)
AST: 17 U/L (ref 10–35)
Albumin: 4.4 g/dL (ref 3.6–5.1)
Alkaline phosphatase (APISO): 80 U/L (ref 35–144)
BUN: 23 mg/dL (ref 7–25)
CO2: 28 mmol/L (ref 20–32)
Calcium: 9.3 mg/dL (ref 8.6–10.3)
Chloride: 107 mmol/L (ref 98–110)
Creat: 1.16 mg/dL (ref 0.70–1.33)
GFR, Est African American: 81 mL/min/{1.73_m2} (ref >=60)
GFR, Est Non African American: 70 mL/min/{1.73_m2} (ref >=60)
Globulin: 2.5 g/dL (calc) (ref 1.9–3.7)
Glucose, Bld: 89 mg/dL (ref 65–99)
Potassium: 4.6 mmol/L (ref 3.5–5.3)
Sodium: 142 mmol/L (ref 135–146)
Total Bilirubin: 0.7 mg/dL (ref 0.2–1.2)
Total Protein: 6.9 g/dL (ref 6.1–8.1)

## 2020-01-03 LAB — CBC
HCT: 46 % (ref 38.5–50.0)
Hemoglobin: 15.6 g/dL (ref 13.2–17.1)
MCH: 31.8 pg (ref 27.0–33.0)
MCHC: 33.9 g/dL (ref 32.0–36.0)
MCV: 93.7 fL (ref 80.0–100.0)
MPV: 10.2 fL (ref 7.5–12.5)
Platelets: 331 10*3/uL (ref 140–400)
RBC: 4.91 10*6/uL (ref 4.20–5.80)
RDW: 12.1 % (ref 11.0–15.0)
WBC: 12.4 10*3/uL — ABNORMAL HIGH (ref 3.8–10.8)

## 2020-01-03 LAB — TSH: TSH: 1.67 mIU/L (ref 0.40–4.50)

## 2020-01-03 LAB — LIPID PANEL
Cholesterol: 126 mg/dL (ref <200)
HDL: 42 mg/dL (ref >=40)
LDL Cholesterol (Calc): 68 mg/dL (calc)
Non-HDL Cholesterol (Calc): 84 mg/dL (calc) (ref <130)
Total CHOL/HDL Ratio: 3 (calc) (ref <5.0)
Triglycerides: 84 mg/dL (ref <150)

## 2020-01-03 LAB — PSA: PSA: 1.57 ng/mL (ref <=4.0)

## 2020-01-03 NOTE — Assessment & Plan Note (Signed)
Well adult  Orders Placed This Encounter  Procedures  . COMPLETE METABOLIC PANEL WITH GFR  . CBC  . Lipid Profile  . PSA  . TSH  Screening:  UTD Immunizations: UTD Anticipatory guidance/Risk factor reduction:  Recommendations per AVS.

## 2020-01-03 NOTE — Progress Notes (Signed)
Benjamin Warren - 59 y.o. male MRN [id number]  Date of birth: [birth year]  Subjective Chief Complaint  Patient presents with  . Annual Exam    HPI Benjamin Warren is a 59 y.o. male here today for annual exam.    Doing well today.  No new concerns.   He is a non-smoker, consumes EtOH occasionally.  He does exercise a couple of days per week and feels like diet is pretty good.  Vaccines and and age appropriate screenings up to date.    Review of Systems  Constitutional: Negative for chills, fever, malaise/fatigue and weight loss.  HENT: Negative for congestion, ear pain and sore throat.   Eyes: Negative for blurred vision, double vision and pain.  Respiratory: Negative for cough and shortness of breath.   Cardiovascular: Negative for chest pain and palpitations.  Gastrointestinal: Negative for abdominal pain, blood in stool, constipation, heartburn and nausea.  Genitourinary: Negative for dysuria and urgency.  Musculoskeletal: Negative for joint pain and myalgias.  Neurological: Negative for dizziness and headaches.  Endo/Heme/Allergies: Does not bruise/bleed easily.  Psychiatric/Behavioral: Negative for depression. The patient is not nervous/anxious and does not have insomnia.       No Known Allergies  Past Medical History:  Diagnosis Date  . Anxiety 11/21/2016  . Arthritis   . Chronic pain syndrome   . Crepitus of left TMJ on opening of jaw 11/21/2016  . Depression   . Fever blister 11/21/2016  . Fibromyalgia   . GERD (gastroesophageal reflux disease)   . Left rotator cuff tear 10/09/2012  . 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 Sleep apnea    had test-no dr told him he needed cpap  . TMJ syndrome   . Vitamin D deficiency 11/21/2016    Past Surgical History:  Procedure Laterality Date  . ELBOW ARTHROPLASTY  1996   right  . HARDWARE REMOVAL  1997   rt wrist  . ORIF WRIST FRACTURE  1997   right  . SHOULDER ARTHROSCOPY  1996   right  . SHOULDER ARTHROSCOPY WITH ROTATOR CUFF  REPAIR AND SUBACROMIAL DECOMPRESSION Left 10/09/2012   Procedure: LEFT SHOULDER ARTHROSCOPY WITH SUBACROMIAL DECOMPRESSION, DISTAL CLAVICLE EXCISION DEBRIDEMENT AND ROTATOR CUFF REPAIR;  Surgeon: Eulas Post, MD;  Location: [facility];  Service: Orthopedics;  Laterality: Left;  Marland Kitchen VASECTOMY      Social History   Socioeconomic History  . Marital status: Married    Spouse name: Not on file  . Number of children: Not on file  . Years of education: Not on file  . Highest education level: Not on file  Occupational History  . Not on file  Tobacco Use  . Smoking status: Former Smoker    Packs/day: 0.50    Years: 6.00    Pack years: 3.00    Types: Cigarettes    Quit date: 10/03/1982    Years since quitting: 37.2  . Smokeless tobacco: Never Used  Vaping Use  . Vaping Use: Never used  Substance and Sexual Activity  . Alcohol use: Yes    Comment: occ  . Drug use: Never  . Sexual activity: Yes    Birth control/protection: Post-menopausal  Other Topics Concern  . Not on file  Social History Narrative  . Not on file   Social Determinants of Health   Financial Resource Strain: Not on file  Food Insecurity: Not on file  Transportation Needs: Not on file  Physical Activity: Not on file  Stress: Not on file  Social Connections: Not on file    Family History  Problem Relation Age of Onset  . Alcohol abuse Mother   . Hypertension Mother   . Alcohol abuse Other        FAMILY HISTORY  . Arthritis Other        FAMILY HISTORY  . Stroke Other   . Pancreatic cancer Brother   . Liver cancer Brother     Health Maintenance  Topic Date Due  . COVID-19 Vaccine (1) 01/19/2020 (Originally 08/11/1972)  . INFLUENZA VACCINE  04/13/2020 (Originally 08/15/2019)  . COLONOSCOPY  10/15/2023  . TETANUS/TDAP  06/23/2026  . Hepatitis C Screening  Completed  . HIV Screening  Completed      ----------------------------------------------------------------------------------------------------------------------------------------------------------------------------------------------------------------- Physical Exam BP 110/68   Pulse 65   Temp 97.7 F (36.5 C)   Wt 185 lb 9.6 oz (84.2 kg)   SpO2 97%   BMI 27.49 kg/m   Physical Exam Constitutional:      General: He is not in acute distress.    Appearance: He is well-nourished.  HENT:     Head: Normocephalic and atraumatic.     Right Ear: External ear normal.     Left Ear: External ear normal.     Mouth/Throat:     Mouth: Oropharynx is clear and moist. Mucous membranes are moist.  Eyes:     General: No scleral icterus. Neck:     Thyroid: No thyromegaly.  Cardiovascular:     Rate and Rhythm: Normal rate and regular rhythm.     Pulses: Intact distal pulses.     Heart sounds: Normal heart sounds.  Pulmonary:     Effort: Pulmonary effort is normal.     Breath sounds: Normal breath sounds.  Abdominal:     General: Bowel sounds are normal. There is no distension.     Palpations: Abdomen is soft.     Tenderness: There is no abdominal tenderness. There is no guarding.  Musculoskeletal:        General: No edema.     Cervical back: Normal range of motion.  Lymphadenopathy:     Cervical: No cervical adenopathy.  Skin:    General: Skin is warm and dry.     Findings: No rash.  Neurological:     Mental Status: He is alert and oriented to person, place, and time.     Cranial Nerves: No cranial nerve deficit.     Motor: No abnormal muscle tone.  Psychiatric:        Mood and Affect: Mood and affect and mood normal.        Behavior: Behavior normal.     ------------------------------------------------------------------------------------------------------------------------------------------------------------------------------------------------------------------- Assessment and Plan  No problem-specific Assessment &  Plan notes found for this encounter.   No orders of the defined types were placed in this encounter.   No follow-ups on file.    This visit occurred during the SARS-CoV-2 public health emergency.  Safety protocols were in place, including screening questions prior to the visit, additional usage of staff PPE, and extensive cleaning of exam room while observing appropriate contact time as indicated for disinfecting solutions.

## 2020-01-03 NOTE — Patient Instructions (Signed)
Preventive Care 40-57 Years Old, Male °Preventive care refers to lifestyle choices and visits with your health care provider that can promote health and wellness. This includes: °· A yearly physical exam. This is also called an annual well check. °· Regular dental and eye exams. °· Immunizations. °· Screening for certain conditions. °· Healthy lifestyle choices, such as eating a healthy diet, getting regular exercise, not using drugs or products that contain nicotine and tobacco, and limiting alcohol use. °What can I expect for my preventive care visit? °Physical exam °Your health care provider will check: °· Height and weight. These may be used to calculate body mass index (BMI), which is a measurement that tells if you are at a healthy weight. °· Heart rate and blood pressure. °· Your skin for abnormal spots. °Counseling °Your health care provider may ask you questions about: °· Alcohol, tobacco, and drug use. °· Emotional well-being. °· Home and relationship well-being. °· Sexual activity. °· Eating habits. °· Work and work environment. °What immunizations do I need? ° °Influenza (flu) vaccine °· This is recommended every year. °Tetanus, diphtheria, and pertussis (Tdap) vaccine °· You may need a Td booster every 10 years. °Varicella (chickenpox) vaccine °· You may need this vaccine if you have not already been vaccinated. °Zoster (shingles) vaccine °· You may need this after age 60. °Measles, mumps, and rubella (MMR) vaccine °· You may need at least one dose of MMR if you were born in 1957 or later. You may also need a second dose. °Pneumococcal conjugate (PCV13) vaccine °· You may need this if you have certain conditions and were not previously vaccinated. °Pneumococcal polysaccharide (PPSV23) vaccine °· You may need one or two doses if you smoke cigarettes or if you have certain conditions. °Meningococcal conjugate (MenACWY) vaccine °· You may need this if you have certain conditions. °Hepatitis A  vaccine °· You may need this if you have certain conditions or if you travel or work in places where you may be exposed to hepatitis A. °Hepatitis B vaccine °· You may need this if you have certain conditions or if you travel or work in places where you may be exposed to hepatitis B. °Haemophilus influenzae type b (Hib) vaccine °· You may need this if you have certain risk factors. °Human papillomavirus (HPV) vaccine °· If recommended by your health care provider, you may need three doses over 6 months. °You may receive vaccines as individual doses or as more than one vaccine together in one shot (combination vaccines). Talk with your health care provider about the risks and benefits of combination vaccines. °What tests do I need? °Blood tests °· Lipid and cholesterol levels. These may be checked every 5 years, or more frequently if you are over 50 years old. °· Hepatitis C test. °· Hepatitis B test. °Screening °· Lung cancer screening. You may have this screening every year starting at age 55 if you have a 30-pack-year history of smoking and currently smoke or have quit within the past 15 years. °· Prostate cancer screening. Recommendations will vary depending on your family history and other risks. °· Colorectal cancer screening. All adults should have this screening starting at age 50 and continuing until age 75. Your health care provider may recommend screening at age 45 if you are at increased risk. You will have tests every 1-10 years, depending on your results and the type of screening test. °· Diabetes screening. This is done by checking your blood sugar (glucose) after you have not eaten   for a while (fasting). You may have this done every 1-3 years.  Sexually transmitted disease (STD) testing. Follow these instructions at home: Eating and drinking  Eat a diet that includes fresh fruits and vegetables, whole grains, lean protein, and low-fat dairy products.  Take vitamin and mineral supplements as  recommended by your health care provider.  Do not drink alcohol if your health care provider tells you not to drink.  If you drink alcohol: ? Limit how much you have to 0-2 drinks a day. ? Be aware of how much alcohol is in your drink. In the [place] one drink equals one 12 oz bottle of beer (355 mL), one 5 oz glass of wine (148 mL), or one 1 oz glass of hard liquor (44 mL). Lifestyle  Take daily care of your teeth and gums.  Stay active. Exercise for at least 30 minutes on 5 or more days each week.  Do not use any products that contain nicotine or tobacco, such as cigarettes, e-cigarettes, and chewing tobacco. If you need help quitting, ask your health care provider.  If you are sexually active, practice safe sex. Use a condom or other form of protection to prevent STIs (sexually transmitted infections).  Talk with your health care provider about taking a low-dose aspirin every day starting at age 50. What's next?  Go to your health care provider once a year for a well check visit.  Ask your health care provider how often you should have your eyes and teeth checked.  Stay up to date on all vaccines. This information is not intended to replace advice given to you by your health care provider. Make sure you discuss any questions you have with your health care provider. Document Revised: 12/25/2017 Document Reviewed: 12/25/2017 Elsevier Patient Education  2020 Elsevier Inc.  

## 2020-01-04 ENCOUNTER — Encounter: Payer: Self-pay | Admitting: Family Medicine

## 2020-02-09 LAB — HM COLONOSCOPY

## 2020-02-21 ENCOUNTER — Telehealth: Payer: Self-pay

## 2020-02-21 DIAGNOSIS — M48062 Spinal stenosis, lumbar region with neurogenic claudication: Secondary | ICD-10-CM

## 2020-02-21 NOTE — Telephone Encounter (Signed)
Patient aware of the recommendations for Pletal and that the order for the epidural have been placed.  He will await a call for scheduling.

## 2020-02-21 NOTE — Telephone Encounter (Signed)
Stay off pletal until 1 day after epidural.  Ordering epidural now.

## 2020-02-21 NOTE — Telephone Encounter (Signed)
Patient called to request another epidural in his lumbar spine with Dr. Alfredo Batty. He also reports having had a banding last week for which he has to stop his Pletal. If his is going to have an epidural, should he start it back?

## 2020-02-25 ENCOUNTER — Other Ambulatory Visit: Payer: Self-pay | Admitting: Family Medicine

## 2020-02-25 MED ORDER — OMEPRAZOLE 20 MG PO CPDR
20.0000 mg | DELAYED_RELEASE_CAPSULE | Freq: Every day | ORAL | 1 refills | Status: DC
Start: 2020-02-25 — End: 2020-08-23

## 2020-03-02 ENCOUNTER — Other Ambulatory Visit: Payer: Self-pay

## 2020-03-02 ENCOUNTER — Ambulatory Visit
Admission: RE | Admit: 2020-03-02 | Discharge: 2020-03-02 | Disposition: A | Discharge status: Home / Self Care | Payer: 59 | Source: Ambulatory Visit | Attending: Sports Medicine | Admitting: Sports Medicine

## 2020-03-02 DIAGNOSIS — M48062 Spinal stenosis, lumbar region with neurogenic claudication: Secondary | ICD-10-CM

## 2020-03-02 MED ORDER — METHYLPREDNISOLONE ACETATE 40 MG/ML INJ SUSP (RADIOLOG
120.0000 mg | Freq: Once | INTRAMUSCULAR | Status: AC
  Administered 2020-03-02: 120 mg via EPIDURAL

## 2020-03-02 MED ORDER — IOPAMIDOL (ISOVUE-M 200) INJECTION 41%
1.0000 mL | Freq: Once | INTRAMUSCULAR | Status: AC
  Administered 2020-03-02: 1 mL via EPIDURAL

## 2020-03-02 NOTE — Discharge Instructions (Signed)
Post Procedure Spinal Discharge Instruction Sheet  1. You may resume a regular diet and any medications that you routinely take (including pain medications).  2. No driving day of procedure.  3. Light activity throughout the rest of the day.  Do not do any strenuous work, exercise, bending or lifting.  The day following the procedure, you can resume normal physical activity but you should refrain from exercising or physical therapy for at least three days thereafter.   Common Side Effects:   Headaches- take your usual medications as directed by your physician.  Increase your fluid intake.  Caffeinated beverages may be helpful.  Lie flat in bed until your headache resolves.   Restlessness or inability to sleep- you may have trouble sleeping for the next few days.  Ask your referring physician if you need any medication for sleep.   Facial flushing or redness- should subside within a few days.   Increased pain- a temporary increase in pain a day or two following your procedure is not unusual.  Take your pain medication as prescribed by your referring physician.   Leg cramps  Please contact our office at [phone] for the following symptoms:  Fever greater than 100 degrees.  Headaches unresolved with medication after 2-3 days.  Increased swelling, pain, or redness at injection site.  CONFIRM PT DOES NOT TAKE PLETAL ANYMORE.

## 2020-03-07 DIAGNOSIS — M48062 Spinal stenosis, lumbar region with neurogenic claudication: Secondary | ICD-10-CM

## 2020-03-20 ENCOUNTER — Encounter: Payer: Self-pay | Admitting: Family Medicine

## 2020-03-21 ENCOUNTER — Ambulatory Visit: Payer: 59 | Admitting: Family Medicine

## 2020-03-27 ENCOUNTER — Ambulatory Visit
Admission: RE | Admit: 2020-03-27 | Discharge: 2020-03-27 | Disposition: A | Discharge status: Home / Self Care | Payer: 59 | Source: Ambulatory Visit | Attending: Sports Medicine | Admitting: Sports Medicine

## 2020-03-27 ENCOUNTER — Other Ambulatory Visit: Payer: Self-pay

## 2020-03-27 DIAGNOSIS — M48062 Spinal stenosis, lumbar region with neurogenic claudication: Secondary | ICD-10-CM

## 2020-03-27 MED ORDER — METHYLPREDNISOLONE ACETATE 40 MG/ML INJ SUSP (RADIOLOG
120.0000 mg | Freq: Once | INTRAMUSCULAR | Status: AC
  Administered 2020-03-27: 120 mg via EPIDURAL

## 2020-03-27 MED ORDER — IOPAMIDOL (ISOVUE-M 200) INJECTION 41%
1.0000 mL | Freq: Once | INTRAMUSCULAR | Status: AC
  Administered 2020-03-27: 1 mL via EPIDURAL

## 2020-03-27 NOTE — Discharge Instructions (Signed)

## 2020-05-07 ENCOUNTER — Other Ambulatory Visit: Payer: Self-pay | Admitting: Internal Medicine

## 2020-05-15 ENCOUNTER — Telehealth: Payer: Self-pay

## 2020-05-15 DIAGNOSIS — M48062 Spinal stenosis, lumbar region with neurogenic claudication: Secondary | ICD-10-CM

## 2020-05-15 NOTE — Telephone Encounter (Signed)
Patient left message stating he would like to get another back injection with Dr. Alfredo Batty.

## 2020-05-15 NOTE — Telephone Encounter (Signed)
He has already had 3 in a 70-month period, he needs to wait until at least early next month before requesting another 1.

## 2020-05-17 NOTE — Telephone Encounter (Signed)
That is reasonable, order placed.

## 2020-05-17 NOTE — Telephone Encounter (Signed)
Wife called back and asked if you could go ahead and put in the referral for it to be scheduled around June 1. They are going on vacation on June 11th and would like to get the epidural between June 1-10. She stated that sometimes it takes a little bit to get on the books.

## 2020-05-17 NOTE — Telephone Encounter (Signed)
Left detailed message on VM. Questions.concerns call office or send mychart message.

## 2020-05-17 NOTE — Addendum Note (Signed)
Addended by: Monica Becton on: 05/17/2020 05:04 PM   Modules accepted: Orders

## 2020-05-23 NOTE — Telephone Encounter (Signed)
Left message on VM that order was placed for it to be scheduled between June 1-10.

## 2020-05-26 ENCOUNTER — Other Ambulatory Visit: Payer: Self-pay | Admitting: Sports Medicine

## 2020-05-26 DIAGNOSIS — G729 Myopathy, unspecified: Secondary | ICD-10-CM

## 2020-06-02 NOTE — Telephone Encounter (Signed)
Form placed on provider's desk for signature.  

## 2020-06-05 ENCOUNTER — Ambulatory Visit: Payer: 59 | Admitting: Sports Medicine

## 2020-06-05 ENCOUNTER — Other Ambulatory Visit: Payer: Self-pay

## 2020-06-05 ENCOUNTER — Encounter: Payer: Self-pay | Admitting: Sports Medicine

## 2020-06-05 ENCOUNTER — Ambulatory Visit (INDEPENDENT_AMBULATORY_CARE_PROVIDER_SITE_OTHER): Payer: 59

## 2020-06-05 DIAGNOSIS — M1812 Unilateral primary osteoarthritis of first carpometacarpal joint, left hand: Secondary | ICD-10-CM

## 2020-06-05 DIAGNOSIS — M7542 Impingement syndrome of left shoulder: Secondary | ICD-10-CM

## 2020-06-05 NOTE — Progress Notes (Signed)
    Procedures performed today:    Procedure: Real-time Ultrasound Guided injection of the left first carpometacarpal joint Device: Samsung HS60  Verbal informed consent obtained.  Time-out conducted.  Noted no overlying erythema, induration, or other signs of local infection.  Skin prepped in a sterile fashion.  Local anesthesia: Topical Ethyl chloride.  With sterile technique and under real time ultrasound guidance:  Noted arthritic joint, 1/2 cc lidocaine, 1/2 cc kenalog 40 injected easily. Completed without difficulty  Advised to call if fevers/chills, erythema, induration, drainage, or persistent bleeding.  Images permanently stored and available for review in PACS.  Impression: Technically successful ultrasound guided injection.  Independent interpretation of notes and tests performed by another provider:   None.  Brief History, Exam, Impression, and Recommendations:    Primary osteoarthritis of first carpometacarpal joint of left hand Recurrence of the pain in the left thumb basal joint, last injected in May 2021. Repeat injection today. This is a chronic process with exacerbation.  Impingement syndrome, shoulder, left Recurrence of left shoulder impingement syndrome, he has good strength, impingement signs on exam. Symptoms only been present for about 2 weeks so we will start with some rotator cuff rehab followed by a subacromial injection in a month if not better.    ___________________________________________ [name]. Benjamin Stain, M.D., ABFM., CAQSM. Primary Care and Sports Medicine [facility]  Adjunct Instructor of Family Medicine  [employer]

## 2020-06-05 NOTE — Assessment & Plan Note (Signed)
Recurrence of the pain in the left thumb basal joint, last injected in May 2021. Repeat injection today. This is a chronic process with exacerbation.

## 2020-06-05 NOTE — Assessment & Plan Note (Signed)
Recurrence of left shoulder impingement syndrome, he has good strength, impingement signs on exam. Symptoms only been present for about 2 weeks so we will start with some rotator cuff rehab followed by a subacromial injection in a month if not better.

## 2020-06-19 ENCOUNTER — Other Ambulatory Visit: Payer: Self-pay

## 2020-06-19 ENCOUNTER — Ambulatory Visit
Admission: RE | Admit: 2020-06-19 | Discharge: 2020-06-19 | Disposition: A | Discharge status: Home / Self Care | Payer: 59 | Source: Ambulatory Visit | Attending: Sports Medicine | Admitting: Sports Medicine

## 2020-06-19 DIAGNOSIS — M48062 Spinal stenosis, lumbar region with neurogenic claudication: Secondary | ICD-10-CM

## 2020-06-19 MED ORDER — METHYLPREDNISOLONE ACETATE 40 MG/ML INJ SUSP (RADIOLOG
80.0000 mg | Freq: Once | INTRAMUSCULAR | Status: AC
  Administered 2020-06-19: 80 mg via EPIDURAL

## 2020-06-19 MED ORDER — IOPAMIDOL (ISOVUE-M 200) INJECTION 41%
1.0000 mL | Freq: Once | INTRAMUSCULAR | Status: AC
  Administered 2020-06-19: 1 mL via EPIDURAL

## 2020-06-19 NOTE — Discharge Instructions (Signed)
Post Procedure Spinal Discharge Instruction Sheet  1. You may resume a regular diet and any medications that you routinely take (including pain medications).  2. No driving day of procedure.  3. Light activity throughout the rest of the day.  Do not do any strenuous work, exercise, bending or lifting.  The day following the procedure, you can resume normal physical activity but you should refrain from exercising or physical therapy for at least three days thereafter.   Common Side Effects:   Headaches- take your usual medications as directed by your physician.  Increase your fluid intake.  Caffeinated beverages may be helpful.  Lie flat in bed until your headache resolves.   Restlessness or inability to sleep- you may have trouble sleeping for the next few days.  Ask your referring physician if you need any medication for sleep.   Facial flushing or redness- should subside within a few days.   Increased pain- a temporary increase in pain a day or two following your procedure is not unusual.  Take your pain medication as prescribed by your referring physician.   Leg cramps  Please contact our office at [phone] for the following symptoms:  Fever greater than 100 degrees.  Headaches unresolved with medication after 2-3 days.  Increased swelling, pain, or redness at injection site.   Thank you for visiting [facility] today.   YOU MAY RESUME YOUR PLETAL 4 HOURS AFTER INJECTION TODAY

## 2020-07-07 ENCOUNTER — Ambulatory Visit: Payer: 59 | Admitting: Sports Medicine

## 2020-08-09 ENCOUNTER — Encounter: Payer: Self-pay | Admitting: Family Medicine

## 2020-08-09 ENCOUNTER — Other Ambulatory Visit: Payer: Self-pay | Admitting: Internal Medicine

## 2020-08-10 ENCOUNTER — Other Ambulatory Visit: Payer: Self-pay | Admitting: Family Medicine

## 2020-08-10 MED ORDER — VALACYCLOVIR HCL 1 G PO TABS: 1000.0000 mg | ORAL_TABLET | Freq: Two times a day (BID) | ORAL | 1 refills | Status: DC

## 2020-08-10 NOTE — Telephone Encounter (Signed)
Rx renewed

## 2020-08-23 ENCOUNTER — Other Ambulatory Visit: Payer: Self-pay

## 2020-08-23 ENCOUNTER — Ambulatory Visit: Payer: 59 | Admitting: Family Medicine

## 2020-08-23 ENCOUNTER — Encounter: Payer: Self-pay | Admitting: Family Medicine

## 2020-08-23 ENCOUNTER — Ambulatory Visit (INDEPENDENT_AMBULATORY_CARE_PROVIDER_SITE_OTHER): Payer: 59

## 2020-08-23 VITALS — BP 130/68 | HR 65 | Temp 98.1°F | Ht 70.0 in | Wt 189.0 lb

## 2020-08-23 DIAGNOSIS — S82892A Other fracture of left lower leg, initial encounter for closed fracture: Secondary | ICD-10-CM | POA: Diagnosis not present

## 2020-08-23 DIAGNOSIS — K219 Gastro-esophageal reflux disease without esophagitis: Secondary | ICD-10-CM

## 2020-08-23 DIAGNOSIS — M79672 Pain in left foot: Secondary | ICD-10-CM | POA: Diagnosis not present

## 2020-08-23 DIAGNOSIS — M25572 Pain in left ankle and joints of left foot: Secondary | ICD-10-CM | POA: Diagnosis not present

## 2020-08-23 DIAGNOSIS — S82899A Other fracture of unspecified lower leg, initial encounter for closed fracture: Secondary | ICD-10-CM | POA: Insufficient documentation

## 2020-08-23 MED ORDER — OMEPRAZOLE 20 MG PO CPDR: 20.0000 mg | DELAYED_RELEASE_CAPSULE | Freq: Every day | ORAL | 1 refills | Status: DC

## 2020-08-23 NOTE — Assessment & Plan Note (Addendum)
2 small avulsion fractures noted along the left foot/ankle.  There appears to be a fracture along the great toe as well along the distal phalanx however he does not have significant pain in this area.  He does have a boot at home that he will plan to use.  I recommend that he ice and use compression to the area as well.  We will get him set up with Dr. Benjamin Stain for follow-up.

## 2020-08-23 NOTE — Patient Instructions (Addendum)
I would recommend using ace wrap or ankle sleeve for compression.  You can use etodolac for pain.

## 2020-08-23 NOTE — Progress Notes (Signed)
BRECKON REEVES - 60 y.o. male MRN [id number]  Date of birth: 07-Jul-1960  Subjective Chief Complaint  Patient presents with   Ankle Injury    HPI Jeannett Senior is a 60 year old male here today with complaint of left foot and ankle pain.  He reports that he was walking down some steps and missed stepped rolling his ankle.  He does have some swelling, pain and bruising of the foot and ankle.  Bruising is most notable along the lateral foot as well as throughout the toes.  He reports that pain has improved significantly since initial injury.  He is able to bear weight and walk on the foot albeit with some pain.  He is already taking etodolac.  ROS:  A comprehensive ROS was completed and negative except as noted per HPI  No Known Allergies  Past Medical History:  Diagnosis Date   Anxiety 11/21/2016   Arthritis    Chronic pain syndrome    Crepitus of left TMJ on opening of jaw 11/21/2016   Depression    Fever blister 11/21/2016   Fibromyalgia    GERD (gastroesophageal reflux disease)    Left rotator cuff tear 10/09/2012   Male hypogonadism 11/21/2016   Sleep apnea    had test-no dr told him he needed cpap   TMJ syndrome    Vitamin D deficiency 11/21/2016    Past Surgical History:  Procedure Laterality Date   ELBOW ARTHROPLASTY  1996   right   HARDWARE REMOVAL  1997   rt wrist   ORIF WRIST FRACTURE  1997   right   SHOULDER ARTHROSCOPY  1996   right   SHOULDER ARTHROSCOPY WITH ROTATOR CUFF REPAIR AND SUBACROMIAL DECOMPRESSION Left 10/09/2012   Procedure: LEFT SHOULDER ARTHROSCOPY WITH SUBACROMIAL DECOMPRESSION, DISTAL CLAVICLE EXCISION DEBRIDEMENT AND ROTATOR CUFF REPAIR;  Surgeon: Eulas Post, MD;  Location: [facility];  Service: Orthopedics;  Laterality: Left;   VASECTOMY      Social History   Socioeconomic History   Marital status: Married    Spouse name: Not on file   Number of children: Not on file   Years of education: Not on file   Highest education level:  Not on file  Occupational History   Not on file  Tobacco Use   Smoking status: Former    Packs/day: 0.50    Years: 6.00    Pack years: 3.00    Types: Cigarettes    Quit date: 10/03/1982    Years since quitting: 37.9   Smokeless tobacco: Never  Vaping Use   Vaping Use: Never used  Substance and Sexual Activity   Alcohol use: Yes    Comment: occ   Drug use: Never   Sexual activity: Yes    Birth control/protection: Post-menopausal  Other Topics Concern   Not on file  Social History Narrative   Not on file   Social Determinants of Health   Financial Resource Strain: Not on file  Food Insecurity: Not on file  Transportation Needs: Not on file  Physical Activity: Not on file  Stress: Not on file  Social Connections: Not on file    Family History  Problem Relation Age of Onset   Alcohol abuse Mother    Hypertension Mother    Alcohol abuse Other        FAMILY HISTORY   Arthritis Other        FAMILY HISTORY   Stroke Other    Pancreatic cancer Brother    Liver cancer  Brother     Health Maintenance  Topic Date Due   COVID-19 Vaccine (1) Never done   Zoster Vaccines- Shingrix (2 of 2) 02/01/2019   INFLUENZA VACCINE  08/14/2020   COLONOSCOPY (Pts 45-78yrs Insurance coverage will need to be confirmed)  10/15/2023   TETANUS/TDAP  06/23/2026   Hepatitis C Screening  Completed   HIV Screening  Completed   Pneumococcal Vaccine 13-1 Years old  Aged Out   HPV VACCINES  Aged Out     ----------------------------------------------------------------------------------------------------------------------------------------------------------------------------------------------------------------- Physical Exam BP 130/68 (BP Location: Left Arm, Patient Position: Sitting, Cuff Size: Large)   Pulse 65   Temp 98.1 F (36.7 C)   Ht 5\' 10"  (1.778 m)   Wt 189 lb (85.7 kg)   SpO2 97%   BMI 27.12 kg/m   Physical Exam Constitutional:      Appearance: Normal appearance.   Musculoskeletal:     Comments: Range of motion of the foot is normal.  He does have some pain with resisted eversion of the foot.  No pain with manipulation of the ankle or Lisfranc joint.  Toes without significant tenderness.  Neurological:     Mental Status: He is alert.    ------------------------------------------------------------------------------------------------------------------------------------------------------------------------------------------------------------------- Assessment and Plan  Avulsion fracture of ankle 2 small avulsion fractures noted along the left foot/ankle.  There appears to be a fracture along the great toe as well along the distal phalanx however he does not have significant pain in this area.  He does have a boot at home that he will plan to use.  I recommend that he ice and use compression to the area as well.  We will get him set up with Dr. for follow-up.   No orders of the defined types were placed in this encounter.   No follow-ups on file.    This visit occurred during the SARS-CoV-2 public health emergency.  Safety protocols were in place, including screening questions prior to the visit, additional usage of staff PPE, and extensive cleaning of exam room while observing appropriate contact time as indicated for disinfecting solutions.

## 2020-09-04 DIAGNOSIS — M48062 Spinal stenosis, lumbar region with neurogenic claudication: Secondary | ICD-10-CM

## 2020-09-07 ENCOUNTER — Other Ambulatory Visit: Payer: Self-pay | Admitting: Internal Medicine

## 2020-09-08 DIAGNOSIS — M48062 Spinal stenosis, lumbar region with neurogenic claudication: Secondary | ICD-10-CM

## 2020-09-14 ENCOUNTER — Other Ambulatory Visit: Payer: Self-pay | Admitting: Sports Medicine

## 2020-09-14 DIAGNOSIS — I739 Peripheral vascular disease, unspecified: Secondary | ICD-10-CM

## 2020-09-14 DIAGNOSIS — R252 Cramp and spasm: Secondary | ICD-10-CM

## 2020-09-22 ENCOUNTER — Ambulatory Visit: Payer: 59 | Admitting: Sports Medicine

## 2020-09-22 ENCOUNTER — Other Ambulatory Visit: Payer: Self-pay

## 2020-09-22 DIAGNOSIS — S82892D Other fracture of left lower leg, subsequent encounter for closed fracture with routine healing: Secondary | ICD-10-CM

## 2020-09-22 NOTE — Assessment & Plan Note (Signed)
Benjamin Warren returns, he is a pleasant 60 year old male, he had an inversion injury maybe a month ago, he was seen by Dr. Ashley Royalty, x-rays showed potential avulsion from the lateral foot, as well as a potential transverse fracture through the first distal phalanx. He has done really well, he is out of the boot, he has no pain over the foot, ankle, toes. He is able to jump up and down on the affected extremity without any discomfort, I think he is healed, return as needed. May use normal footwear.

## 2020-09-22 NOTE — Progress Notes (Signed)
    Procedures performed today:    None.  Independent interpretation of notes and tests performed by another provider:   X-rays personally reviewed, there do appear to be avulsions from the lateral midfoot, he also has what appears to be a subacute transverse fracture through the first distal phalanx.  Brief History, Exam, Impression, and Recommendations:    Avulsion fracture of ankle Benjamin Warren returns, he is a pleasant 60 year old male, he had an inversion injury maybe a month ago, he was seen by Dr. Ashley Royalty, x-rays showed potential avulsion from the lateral foot, as well as a potential transverse fracture through the first distal phalanx. He has done really well, he is out of the boot, he has no pain over the foot, ankle, toes. He is able to jump up and down on the affected extremity without any discomfort, I think he is healed, return as needed. May use normal footwear.    ___________________________________________ [name]. Benjamin Stain, M.D., ABFM., CAQSM. Primary Care and Sports Medicine [facility]  Adjunct Instructor of Family Medicine  [employer]

## 2020-10-02 ENCOUNTER — Ambulatory Visit
Admission: RE | Admit: 2020-10-02 | Discharge: 2020-10-02 | Disposition: A | Discharge status: Home / Self Care | Payer: 59 | Source: Ambulatory Visit | Attending: Sports Medicine | Admitting: Sports Medicine

## 2020-10-02 ENCOUNTER — Other Ambulatory Visit: Payer: Self-pay | Admitting: Sports Medicine

## 2020-10-02 ENCOUNTER — Other Ambulatory Visit: Payer: Self-pay

## 2020-10-02 DIAGNOSIS — I739 Peripheral vascular disease, unspecified: Secondary | ICD-10-CM

## 2020-10-02 DIAGNOSIS — M48062 Spinal stenosis, lumbar region with neurogenic claudication: Secondary | ICD-10-CM

## 2020-10-02 DIAGNOSIS — R252 Cramp and spasm: Secondary | ICD-10-CM

## 2020-10-02 MED ORDER — IOPAMIDOL (ISOVUE-M 200) INJECTION 41%
1.0000 mL | Freq: Once | INTRAMUSCULAR | Status: AC
  Administered 2020-10-02: 1 mL via EPIDURAL

## 2020-10-02 MED ORDER — METHYLPREDNISOLONE ACETATE 40 MG/ML INJ SUSP (RADIOLOG
80.0000 mg | Freq: Once | INTRAMUSCULAR | Status: AC
  Administered 2020-10-02: 80 mg via EPIDURAL

## 2020-10-02 NOTE — Discharge Instructions (Signed)
Post Procedure Spinal Discharge Instruction Sheet  You may resume a regular diet and any medications that you routinely take (including pain medications) unless otherwise noted by MD.  No driving day of procedure.  Light activity throughout the rest of the day.  Do not do any strenuous work, exercise, bending or lifting.  The day following the procedure, you can resume normal physical activity but you should refrain from exercising or physical therapy for at least three days thereafter.  You may apply ice to the injection site, 20 minutes on, 20 minutes off, as needed. Do not apply ice directly to skin.    Common Side Effects:  Headaches- take your usual medications as directed by your physician.  Increase your fluid intake.  Caffeinated beverages may be helpful.  Lie flat in bed until your headache resolves.  Restlessness or inability to sleep- you may have trouble sleeping for the next few days.  Ask your referring physician if you need any medication for sleep.  Facial flushing or redness- should subside within a few days.  Increased pain- a temporary increase in pain a day or two following your procedure is not unusual.  Take your pain medication as prescribed by your referring physician.  Leg cramps  Please contact our office at [phone] for the following symptoms: Fever greater than 100 degrees. Headaches unresolved with medication after 2-3 days. Increased swelling, pain, or redness at injection site.   Thank you for visiting [facility] today.    YOU MAY RESUME YOUR PLETAL 4 HOURS AFTER INJECTION TODAY

## 2020-10-04 ENCOUNTER — Other Ambulatory Visit: Payer: Self-pay | Admitting: Neurosurgery

## 2020-10-04 DIAGNOSIS — M48062 Spinal stenosis, lumbar region with neurogenic claudication: Secondary | ICD-10-CM

## 2020-10-08 ENCOUNTER — Other Ambulatory Visit: Payer: Self-pay

## 2020-10-08 ENCOUNTER — Ambulatory Visit (INDEPENDENT_AMBULATORY_CARE_PROVIDER_SITE_OTHER): Payer: 59

## 2020-10-08 DIAGNOSIS — M48062 Spinal stenosis, lumbar region with neurogenic claudication: Secondary | ICD-10-CM

## 2020-10-10 ENCOUNTER — Ambulatory Visit (INDEPENDENT_AMBULATORY_CARE_PROVIDER_SITE_OTHER): Payer: 59 | Admitting: Sports Medicine

## 2020-10-10 ENCOUNTER — Ambulatory Visit (INDEPENDENT_AMBULATORY_CARE_PROVIDER_SITE_OTHER): Payer: 59

## 2020-10-10 ENCOUNTER — Other Ambulatory Visit: Payer: Self-pay

## 2020-10-10 DIAGNOSIS — M7542 Impingement syndrome of left shoulder: Secondary | ICD-10-CM

## 2020-10-10 NOTE — Assessment & Plan Note (Signed)
Persistent left shoulder pain, impingement signs and symptoms, no weakness to suggest rotator cuff tearing, failed months of home physical therapy, subacromial injection performed today. Return to see me in 4 weeks.

## 2020-10-10 NOTE — Progress Notes (Signed)
    Procedures performed today:    Procedure: Real-time Ultrasound Guided injection of the left subacromial bursa Device: Samsung HS60  Verbal informed consent obtained.  Time-out conducted.  Noted no overlying erythema, induration, or other signs of local infection.  Skin prepped in a sterile fashion.  Local anesthesia: Topical Ethyl chloride.  With sterile technique and under real time ultrasound guidance: Noted partial-thickness rotator cuff tearing, 1 cc Kenalog 40, 1 cc lidocaine, 1 cc bupivacaine injected easily Completed without difficulty  Advised to call if fevers/chills, erythema, induration, drainage, or persistent bleeding.  Images permanently stored and available for review in PACS.  Impression: Technically successful ultrasound guided injection.  Independent interpretation of notes and tests performed by another provider:   None.  Brief History, Exam, Impression, and Recommendations:    Impingement syndrome, shoulder, left Persistent left shoulder pain, impingement signs and symptoms, no weakness to suggest rotator cuff tearing, failed months of home physical therapy, subacromial injection performed today. Return to see me in 4 weeks.   ___________________________________________ [name]. Benjamin Stain, M.D., ABFM., CAQSM. Primary Care and Sports Medicine [facility]  Adjunct Instructor of Family Medicine  [employer]

## 2020-10-11 ENCOUNTER — Other Ambulatory Visit: Payer: Self-pay | Admitting: Neurosurgery

## 2020-10-11 ENCOUNTER — Encounter: Payer: Self-pay | Admitting: Rehabilitative and Restorative Service Providers"

## 2020-10-11 ENCOUNTER — Ambulatory Visit: Payer: 59 | Admitting: Rehabilitative and Restorative Service Providers"

## 2020-10-11 DIAGNOSIS — M5441 Lumbago with sciatica, right side: Secondary | ICD-10-CM

## 2020-10-11 DIAGNOSIS — R29898 Other symptoms and signs involving the musculoskeletal system: Secondary | ICD-10-CM

## 2020-10-11 DIAGNOSIS — G8929 Other chronic pain: Secondary | ICD-10-CM

## 2020-10-11 DIAGNOSIS — R293 Abnormal posture: Secondary | ICD-10-CM | POA: Diagnosis not present

## 2020-10-11 DIAGNOSIS — M5442 Lumbago with sciatica, left side: Secondary | ICD-10-CM

## 2020-10-11 NOTE — Patient Instructions (Signed)
Access Code: [email or web address] URL: [email or web address] Date: 10/11/2020 Prepared by: Corlis Leak  Exercises Supine Piriformis Stretch with Leg Straight - 2 x daily - 7 x weekly - 1 sets - 3 reps - 30 sec hold Hooklying Hamstring Stretch with Strap - 2 x daily - 7 x weekly - 1 sets - 3 reps - 30 sec hold  Patient Education Hospital doctor

## 2020-10-11 NOTE — Therapy (Signed)
[facility] [address] [place], [place], [address] Phone: [phone]   Fax:  [phone]  Physical Therapy Evaluation  Patient Details  Name: Benjamin Warren MRN: [id number] Date of Birth: 31-Jul-1960 Referring Provider (PT): Dr Tressie Stalker   Encounter Date: 10/11/2020   PT End of Session - 10/11/20 1020     Visit Number 1    Number of Visits 1    PT Start Time 0932    PT Stop Time 1016    PT Time Calculation (min) 44 min    Activity Tolerance Patient tolerated treatment well             Past Medical History:  Diagnosis Date   Anxiety 11/21/2016   Arthritis    Chronic pain syndrome    Crepitus of left TMJ on opening of jaw 11/21/2016   Depression    Fever blister 11/21/2016   Fibromyalgia    GERD (gastroesophageal reflux disease)    Left rotator cuff tear 10/09/2012   Male hypogonadism 11/21/2016   Sleep apnea    had test-no dr told him he needed cpap   TMJ syndrome    Vitamin D deficiency 11/21/2016    Past Surgical History:  Procedure Laterality Date   ELBOW ARTHROPLASTY  1996   right   HARDWARE REMOVAL  1997   rt wrist   ORIF WRIST FRACTURE  1997   right   SHOULDER ARTHROSCOPY  1996   right   SHOULDER ARTHROSCOPY WITH ROTATOR CUFF REPAIR AND SUBACROMIAL DECOMPRESSION Left 10/09/2012   Procedure: LEFT SHOULDER ARTHROSCOPY WITH SUBACROMIAL DECOMPRESSION, DISTAL CLAVICLE EXCISION DEBRIDEMENT AND ROTATOR CUFF REPAIR;  Surgeon: Eulas Post, MD;  Location: [facility];  Service: Orthopedics;  Laterality: Left;   VASECTOMY      There were no vitals filed for this visit.    Subjective Assessment - 10/11/20 0940     Subjective Patient reports that he has had back problems with pain into buttocks and posterior thighs bilat for the past 2 years. he has been having injections about every 3 months which are not as effective and not lasting as long. MD reports that he feels patient will  need sugery.    Pertinent History bilat shoulder pain - Rt RCR 02/11/19; now with pain in the Lt shoulder; PAD Rt LE; fibromyalgia; chronic pain syndrome; OA; depression    Currently in Pain? Yes    Pain Score 2     Pain Location Buttocks    Pain Orientation Right;Left;Posterior    Pain Descriptors / Indicators --   pulling and painful   Pain Type Chronic pain    Pain Radiating Towards buttocks to posterior thigh to knee    Pain Onset More than a month ago    Pain Frequency Constant    Aggravating Factors  standing; walking; lifting; bending reaching    Pain Relieving Factors sitting; injections help                [facility] PT Assessment - 10/11/20 0001       Assessment   Medical Diagnosis Lumbago with sciatica bilat LE's    Referring Provider (PT) Dr Tressie Stalker    Onset Date/Surgical Date 09/15/18    Hand Dominance Right    Next MD Visit to schedule - surgery 11/09/20    Prior Therapy here x 2 visits for Rt shoulder      Precautions   Precautions None      Restrictions  Weight Bearing Restrictions No      Balance Screen   Has the patient fallen in the past 6 months No    Has the patient had a decrease in activity level because of a fear of falling?  No    Is the patient reluctant to leave their home because of a fear of falling?  No      Home [employer] information [employer] manager residence    Living Arrangements Spouse/significant other      Prior Function   Level of Independence Independent    Vocation On disability    Vocation Requirements on disability for 6 years for LBP and fibromyalgia    Leisure mowing on riding mower; was walking but not in the 6-9 months otherwise sedentary      Observation/Other Assessments   Focus on Therapeutic Outcomes (FOTO)  43      Sensation   Additional Comments slight numbness in Rt/Lt toes x several years      Posture/Postural Control   Posture Comments head forward; shoudlers rounded; incresed lumbar flexion;  decreased lumbar lordosis - sits with spine rounded      AROM   Lumbar Flexion 85% pulling    Lumbar Extension 50% discomfort LB    Lumbar - Right Side Bend 75% pain Lt LE    Lumbar - Left Side Bend 75% pain Rt LE    Lumbar - Right Rotation 50% pain LB    Lumbar - Left Rotation 50% pain LB      Strength   Overall Strength Comments functional strength assessed WFL's not tested resistively      Flexibility   Hamstrings tight bilat 60 deg    Piriformis tight bilat      Special Tests   Other special tests (-) slump tet; (-) SLR                        Objective measurements completed on examination: See above findings.       [facility] Adult PT Treatment/Exercise - 10/11/20 0001       Self-Care   Self-Care Other Self-Care Comments;Posture    Posture suggestion for sitting with more upright posture    Other Self-Care Comments  initiated education on back care and body mechanics - provided handout      Therapeutic Activites    Other Therapeutic Activities rolling supine to sidelying to come to sit to protect back      Lumbar Exercises: Stretches   Passive Hamstring Stretch Right;Left;2 reps;30 seconds   supine with strap   Piriformis Stretch Right;Left;2 reps;30 seconds   supine travell                    PT Education - 10/11/20 1009     Education Details HEP    Person(s) Educated Patient    Methods Explanation;Demonstration;Tactile cues;Verbal cues;Handout    Comprehension Verbalized understanding;Returned demonstration;Verbal cues required;Tactile cues required              PT Short Term Goals - 10/11/20 1125       PT SHORT TERM GOAL #1   Title Assessment and instruction in exercise    Time 1    Period Days    Status Achieved    Target Date 10/11/20                       Plan - 10/11/20 1108     Clinical  Impression Statement Patient presents with 2 year history of LBP primarily exhibited as bilat buttock and posterior  thigh pain. He has been receiving lumbar injections every 3 months for the past 2 years but reports that the injections are no longer as effective and do not last long. Patient has poor posture and alignment; limited trunk and LE mobility and ROM; muscular tightness through bilat LE's; decreased activity tolerance; sedentary lifestyle. He has scheduled lumbar disc fusion. Patient may benefit from further Physical Therapy following surgery. He currently does not believe therapy will be helpful at this time.    Stability/Clinical Decision Making Stable/Uncomplicated    Clinical Decision Making Low    Rehab Potential Fair    PT Frequency One time visit    PT Treatment/Interventions ADLs/Self Care Home Management;Functional mobility training;Therapeutic activities;Therapeutic exercise;Patient/family education    PT Next Visit Plan Patient to schedule as indicated    PT Home Exercise Plan [id number]    Consulted and Agree with Plan of Care Patient;Family member/caregiver    Family Member Consulted wife Lupita Leash             Patient will benefit from skilled therapeutic intervention in order to improve the following deficits and impairments:  Decreased range of motion, Decreased activity tolerance, Pain, Impaired flexibility, Improper body mechanics, Decreased mobility, Postural dysfunction  Visit Diagnosis: Chronic bilateral low back pain with bilateral sciatica - Plan: PT plan of care cert/re-cert  Other symptoms and signs involving the musculoskeletal system - Plan: PT plan of care cert/re-cert  Abnormal posture - Plan: PT plan of care cert/re-cert     Problem List Patient Active Problem List   Diagnosis Date Noted   Avulsion fracture of ankle 08/23/2020   Impingement syndrome, shoulder, left 06/05/2020   Well adult exam 01/03/2020   Peripheral arterial disease (HCC) 08/03/2019   Impingement syndrome, shoulder, right 01/12/2019   Lumbar spinal stenosis 06/29/2018   Trochanteric bursitis,  right hip 07/08/2017   Primary osteoarthritis of first carpometacarpal joint of left hand 03/18/2017   Chronic headaches 11/21/2016   Anxiety 11/21/2016   Male hypogonadism 11/21/2016   Vitamin D deficiency 11/21/2016   Tremor 10/16/2016   Insomnia 09/27/2016   Memory loss 09/27/2016   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 07/08/2016   Routine general medical examination at a health care facility 11/26/2014   Primary osteoarthritis of both hands 11/25/2014   Allergic rhinitis 12/23/2013   Fibromyalgia    Chronic pain syndrome    Depression    GERD (gastroesophageal reflux disease)    OSA (obstructive sleep apnea)    Osteoarthritis 08/10/2012   Polyarthralgia 10/03/2010    Costella Schwarz Rober Minion, PT, MPH  10/11/2020, 11:31 AM  [facility] [address] [place], [place], [address] Phone: [phone]   Fax:  [phone]  Name: Benjamin Warren MRN: [id number] Date of Birth: 07/22/1960

## 2020-10-18 ENCOUNTER — Ambulatory Visit: Payer: 59 | Admitting: Family Medicine

## 2020-10-30 ENCOUNTER — Other Ambulatory Visit: Payer: Self-pay | Admitting: Neurosurgery

## 2020-11-03 NOTE — Progress Notes (Signed)
Surgical Instructions    Your procedure is scheduled on Thursday, October 27th, 2022.   Report to [facility] Main Entrance "A" at 08:40 A.M., then check in with the Admitting office.  Call this number if you have problems the morning of surgery:  [phone]   If you have any questions prior to your surgery date call [phone]: Open Monday-Friday 8am-4pm    Remember:  Do not eat or drink after midnight the night before your surgery    Take these medicines the morning of surgery with A SIP OF WATER:  cilostazol (PLETAL)  lamoTRIgine (LAMICTAL)  omeprazole (PRILOSEC) rosuvastatin (CRESTOR) valACYclovir (VALTREX) - if needed  As of today, STOP taking any Aspirin (unless otherwise instructed by your surgeon) Aleve, Naproxen, Ibuprofen, Motrin, Advil, Goody's, BC's, all herbal medications, fish oil, and all vitamins.   After your COVID test   You are not required to quarantine however you are required to wear a well-fitting mask when you are out and around people not in your household.  If your mask becomes wet or soiled, replace with a new one.  Wash your hands often with soap and water for 20 seconds or clean your hands with an alcohol-based hand sanitizer that contains at least 60% alcohol.  Do not share personal items.  Notify your provider: if you are in close contact with someone who has COVID  or if you develop a fever of 100.4 or greater, sneezing, cough, sore throat, shortness of breath or body aches.    The day of surgery:          Do not wear jewelry  Do not wear lotions, powders, colognes, or deodorant. Men may shave face and neck. Do not bring valuables to the hospital.              [facility] is not responsible for any belongings or valuables.  Do NOT Smoke (Tobacco/Vaping)  24 hours prior to your procedure  If you use a CPAP at night, you may bring your mask for your overnight stay.   Contacts, glasses, hearing aids, dentures or partials may not be  worn into surgery, please bring cases for these belongings   For patients admitted to the hospital, discharge time will be determined by your treatment team.   Patients discharged the day of surgery will not be allowed to drive home, and someone needs to stay with them for 24 hours.  NO VISITORS WILL BE ALLOWED IN PRE-OP WHERE PATIENTS ARE PREPPED FOR SURGERY.  ONLY 1 SUPPORT PERSON MAY BE PRESENT IN THE WAITING ROOM WHILE YOU ARE IN SURGERY.  IF YOU ARE TO BE ADMITTED, ONCE YOU ARE IN YOUR ROOM YOU WILL BE ALLOWED TWO (2) VISITORS. 1 (ONE) VISITOR MAY STAY OVERNIGHT BUT MUST ARRIVE TO THE ROOM BY 8pm.  Minor children may have two parents present. Special consideration for safety and communication needs will be reviewed on a case by case basis.  Special instructions:    Oral Hygiene is also important to reduce your risk of infection.  Remember - BRUSH YOUR TEETH THE MORNING OF SURGERY WITH YOUR REGULAR TOOTHPASTE   [place]- Preparing For Surgery  Before surgery, you can play an important role. Because skin is not sterile, your skin needs to be as free of germs as possible. You can reduce the number of germs on your skin by washing with CHG (chlorahexidine gluconate) Soap before surgery.  CHG is an antiseptic cleaner which kills germs and bonds with the skin  to continue killing germs even after washing.     Please do not use if you have an allergy to CHG or antibacterial soaps. If your skin becomes reddened/irritated stop using the CHG.  Do not shave (including legs and underarms) for at least 48 hours prior to first CHG shower. It is OK to shave your face.  Please follow these instructions carefully.     Shower the NIGHT BEFORE SURGERY and the MORNING OF SURGERY with CHG Soap.   If you chose to wash your hair, wash your hair first as usual with your normal shampoo. After you shampoo, rinse your hair and body thoroughly to remove the shampoo.  Then [employer] and genitals (private parts)  with your normal soap and rinse thoroughly to remove soap.  After that Use CHG Soap as you would any other liquid soap. You can apply CHG directly to the skin and wash gently with a scrungie or a clean washcloth.   Apply the CHG Soap to your body ONLY FROM THE NECK DOWN.  Do not use on open wounds or open sores. Avoid contact with your eyes, ears, mouth and genitals (private parts). Wash Face and genitals (private parts)  with your normal soap.   Wash thoroughly, paying special attention to the area where your surgery will be performed.  Thoroughly rinse your body with warm water from the neck down.  DO NOT shower/wash with your normal soap after using and rinsing off the CHG Soap.  Pat yourself dry with a CLEAN TOWEL.  Wear CLEAN PAJAMAS to bed the night before surgery  Place CLEAN SHEETS on your bed the night before your surgery  DO NOT SLEEP WITH PETS.   Day of Surgery:  Take a shower with CHG soap. Wear Clean/Comfortable clothing the morning of surgery Do not apply any deodorants/lotions.   Remember to brush your teeth WITH YOUR REGULAR TOOTHPASTE.   Please read over the following fact sheets that you were given.

## 2020-11-06 ENCOUNTER — Other Ambulatory Visit: Payer: Self-pay

## 2020-11-06 ENCOUNTER — Encounter (HOSPITAL_COMMUNITY): Payer: Self-pay

## 2020-11-06 ENCOUNTER — Encounter (HOSPITAL_COMMUNITY)
Admission: RE | Admit: 2020-11-06 | Discharge: 2020-11-06 | Disposition: A | Discharge status: Home / Self Care | Payer: 59 | Source: Ambulatory Visit | Attending: Neurosurgery | Admitting: Neurosurgery

## 2020-11-06 VITALS — BP 116/74 | HR 64 | Temp 98.0°F | Resp 17 | Ht 70.0 in | Wt 186.6 lb

## 2020-11-06 DIAGNOSIS — Z01812 Encounter for preprocedural laboratory examination: Secondary | ICD-10-CM | POA: Diagnosis not present

## 2020-11-06 DIAGNOSIS — Z20822 Contact with and (suspected) exposure to covid-19: Secondary | ICD-10-CM | POA: Diagnosis not present

## 2020-11-06 DIAGNOSIS — Z01818 Encounter for other preprocedural examination: Secondary | ICD-10-CM

## 2020-11-06 LAB — BASIC METABOLIC PANEL
Anion gap: 8 (ref 5–15)
BUN: 20 mg/dL (ref 6–20)
CO2: 27 mmol/L (ref 22–32)
Calcium: 8.9 mg/dL (ref 8.9–10.3)
Chloride: 104 mmol/L (ref 98–111)
Creatinine, Ser: 1.13 mg/dL (ref 0.61–1.24)
GFR, Estimated: >60 mL/min (ref >60)
Glucose, Bld: 95 mg/dL (ref 70–99)
Potassium: 4.4 mmol/L (ref 3.5–5.1)
Sodium: 139 mmol/L (ref 135–145)

## 2020-11-06 LAB — CBC
HCT: 44.3 % (ref 39.0–52.0)
Hemoglobin: 14.7 g/dL (ref 13.0–17.0)
MCH: 31.4 pg (ref 26.0–34.0)
MCHC: 33.2 g/dL (ref 30.0–36.0)
MCV: 94.7 fL (ref 80.0–100.0)
Platelets: 299 10*3/uL (ref 150–400)
RBC: 4.68 MIL/uL (ref 4.22–5.81)
RDW: 12.9 % (ref 11.5–15.5)
WBC: 7.3 10*3/uL (ref 4.0–10.5)
nRBC: 0 % (ref 0.0–0.2)

## 2020-11-06 LAB — SURGICAL PCR SCREEN
MRSA, PCR: NEGATIVE
Staphylococcus aureus: NEGATIVE

## 2020-11-06 LAB — TYPE AND SCREEN
ABO/RH(D): A NEG
Antibody Screen: NEGATIVE

## 2020-11-06 LAB — SARS CORONAVIRUS 2 (TAT 6-24 HRS): SARS Coronavirus 2: NEGATIVE

## 2020-11-06 NOTE — Progress Notes (Signed)
Surgical Instructions    Your procedure is scheduled on Thursday, October 27th, 2022.   Report to [facility] Main Entrance "A" at 08:40 A.M., then check in with the Admitting office.  Call this number if you have problems the morning of surgery:  [phone]   If you have any questions prior to your surgery date call [phone]: Open Monday-Friday 8am-4pm    Remember:  Do not eat or drink after midnight the night before your surgery    Take these medicines the morning of surgery with A SIP OF WATER:  cilostazol (PLETAL)  lamoTRIgine (LAMICTAL)  omeprazole (PRILOSEC) rosuvastatin (CRESTOR) valACYclovir (VALTREX) - if needed  As of today, STOP taking any Aspirin (unless otherwise instructed by your surgeon) Aleve, Naproxen, Ibuprofen, Motrin, Advil, Goody's, BC's, all herbal medications, fish oil, and all vitamins.    The day of surgery:          Do not wear jewelry  Do not wear lotions, powders, colognes, or deodorant. Men may shave face and neck. Do not bring valuables to the hospital.              [facility] is not responsible for any belongings or valuables.  Do NOT Smoke (Tobacco/Vaping)  24 hours prior to your procedure  If you use a CPAP at night, you may bring your mask for your overnight stay.   Contacts, glasses, hearing aids, dentures or partials may not be worn into surgery, please bring cases for these belongings   For patients admitted to the hospital, discharge time will be determined by your treatment team.   Patients discharged the day of surgery will not be allowed to drive home, and someone needs to stay with them for 24 hours.  NO VISITORS WILL BE ALLOWED IN PRE-OP WHERE PATIENTS ARE PREPPED FOR SURGERY.  ONLY 1 SUPPORT PERSON MAY BE PRESENT IN THE WAITING ROOM WHILE YOU ARE IN SURGERY.  IF YOU ARE TO BE ADMITTED, ONCE YOU ARE IN YOUR ROOM YOU WILL BE ALLOWED TWO (2) VISITORS. 1 (ONE) VISITOR MAY STAY OVERNIGHT BUT MUST ARRIVE TO THE ROOM BY 8pm.   Minor children may have two parents present. Special consideration for safety and communication needs will be reviewed on a case by case basis.  Special instructions:    Oral Hygiene is also important to reduce your risk of infection.  Remember - BRUSH YOUR TEETH THE MORNING OF SURGERY WITH YOUR REGULAR TOOTHPASTE   [place]- Preparing For Surgery  Before surgery, you can play an important role. Because skin is not sterile, your skin needs to be as free of germs as possible. You can reduce the number of germs on your skin by washing with CHG (chlorahexidine gluconate) Soap before surgery.  CHG is an antiseptic cleaner which kills germs and bonds with the skin to continue killing germs even after washing.     Please do not use if you have an allergy to CHG or antibacterial soaps. If your skin becomes reddened/irritated stop using the CHG.  Do not shave (including legs and underarms) for at least 48 hours prior to first CHG shower. It is OK to shave your face.  Please follow these instructions carefully.     Shower the NIGHT BEFORE SURGERY and the MORNING OF SURGERY with CHG Soap.   If you chose to wash your hair, wash your hair first as usual with your normal shampoo. After you shampoo, rinse your hair and body thoroughly to remove the shampoo.  Then  Wash Face and genitals (private parts) with your normal soap and rinse thoroughly to remove soap.  After that Use CHG Soap as you would any other liquid soap. You can apply CHG directly to the skin and wash gently with a scrungie or a clean washcloth.   Apply the CHG Soap to your body ONLY FROM THE NECK DOWN.  Do not use on open wounds or open sores. Avoid contact with your eyes, ears, mouth and genitals (private parts). Wash Face and genitals (private parts)  with your normal soap.   Wash thoroughly, paying special attention to the area where your surgery will be performed.  Thoroughly rinse your body with warm water from the neck  down.  DO NOT shower/wash with your normal soap after using and rinsing off the CHG Soap.  Pat yourself dry with a CLEAN TOWEL.  Wear CLEAN PAJAMAS to bed the night before surgery  Place CLEAN SHEETS on your bed the night before your surgery  DO NOT SLEEP WITH PETS.   Day of Surgery:  Take a shower with CHG soap. Wear Clean/Comfortable clothing the morning of surgery Do not apply any deodorants/lotions.   Remember to brush your teeth WITH YOUR REGULAR TOOTHPASTE.   Please read over the following fact sheets that you were given.

## 2020-11-06 NOTE — Progress Notes (Signed)
PCP - Everrett Coombe Cardiologist - denies  Chest x-ray - n/a EKG - n/a ECHO - 01/21/14  SA - yes, wears CPAP   COVID TEST- 11/06/20   Anesthesia review: n/a  Patient denies shortness of breath, fever, cough and chest pain at PAT appointment   All instructions explained to the patient, with a verbal understanding of the material. Patient agrees to go over the instructions while at home for a better understanding. Patient also instructed to self quarantine after being tested for COVID-19. The opportunity to ask questions was provided.

## 2020-11-09 ENCOUNTER — Ambulatory Visit (HOSPITAL_COMMUNITY): Payer: 59

## 2020-11-09 ENCOUNTER — Other Ambulatory Visit: Payer: Self-pay

## 2020-11-09 ENCOUNTER — Ambulatory Visit (HOSPITAL_COMMUNITY): Payer: 59 | Admitting: Certified Registered"

## 2020-11-09 ENCOUNTER — Encounter (HOSPITAL_COMMUNITY): Payer: Self-pay | Admitting: Neurosurgery

## 2020-11-09 ENCOUNTER — Encounter (HOSPITAL_COMMUNITY)
Admission: RE | Disposition: A | Discharge status: Home / Self Care | Payer: Self-pay | Source: Home / Self Care | Attending: Neurosurgery

## 2020-11-09 ENCOUNTER — Ambulatory Visit (HOSPITAL_COMMUNITY)
Admission: RE | Admit: 2020-11-09 | Discharge: 2020-11-10 | Disposition: A | Discharge status: Home / Self Care | Payer: 59 | Attending: Neurosurgery | Admitting: Neurosurgery

## 2020-11-09 DIAGNOSIS — M4726 Other spondylosis with radiculopathy, lumbar region: Secondary | ICD-10-CM | POA: Insufficient documentation

## 2020-11-09 DIAGNOSIS — Z79899 Other long term (current) drug therapy: Secondary | ICD-10-CM | POA: Diagnosis not present

## 2020-11-09 DIAGNOSIS — M48062 Spinal stenosis, lumbar region with neurogenic claudication: Secondary | ICD-10-CM | POA: Diagnosis not present

## 2020-11-09 DIAGNOSIS — M4316 Spondylolisthesis, lumbar region: Secondary | ICD-10-CM | POA: Insufficient documentation

## 2020-11-09 DIAGNOSIS — M5116 Intervertebral disc disorders with radiculopathy, lumbar region: Secondary | ICD-10-CM | POA: Insufficient documentation

## 2020-11-09 DIAGNOSIS — Z419 Encounter for procedure for purposes other than remedying health state, unspecified: Secondary | ICD-10-CM

## 2020-11-09 DIAGNOSIS — Z87891 Personal history of nicotine dependence: Secondary | ICD-10-CM | POA: Insufficient documentation

## 2020-11-09 LAB — ABO/RH: ABO/RH(D): A NEG

## 2020-11-09 SURGERY — POSTERIOR LUMBAR FUSION 1 LEVEL
Anesthesia: General

## 2020-11-09 MED ORDER — BUPIVACAINE LIPOSOME 1.3 % IJ SUSP
INTRAMUSCULAR | Status: AC
  Filled 2020-11-09: qty 20

## 2020-11-09 MED ORDER — ACETAMINOPHEN 500 MG PO TABS: 1000.0000 mg | ORAL_TABLET | Freq: Four times a day (QID) | ORAL | Status: DC

## 2020-11-09 MED ORDER — BACITRACIN ZINC 500 UNIT/GM EX OINT
TOPICAL_OINTMENT | CUTANEOUS | Status: AC
  Filled 2020-11-09: qty 28.35

## 2020-11-09 MED ORDER — CHLORHEXIDINE GLUCONATE CLOTH 2 % EX PADS: 6.0000 | MEDICATED_PAD | Freq: Once | CUTANEOUS | Status: DC

## 2020-11-09 MED ORDER — ORAL CARE MOUTH RINSE: 15.0000 mL | Freq: Once | OROMUCOSAL | Status: AC

## 2020-11-09 MED ORDER — ACETAMINOPHEN 500 MG PO TABS
1000.0000 mg | ORAL_TABLET | Freq: Once | ORAL | Status: AC
  Administered 2020-11-09: 1000 mg via ORAL
  Filled 2020-11-09: qty 2

## 2020-11-09 MED ORDER — FENTANYL CITRATE (PF) 250 MCG/5ML IJ SOLN
INTRAMUSCULAR | Status: AC
  Filled 2020-11-09: qty 5

## 2020-11-09 MED ORDER — MEPERIDINE HCL 25 MG/ML IJ SOLN: 6.2500 mg | INTRAMUSCULAR | Status: DC | PRN

## 2020-11-09 MED ORDER — FENTANYL CITRATE (PF) 250 MCG/5ML IJ SOLN
INTRAMUSCULAR | Status: DC | PRN
  Administered 2020-11-09 (×4): 50 ug via INTRAVENOUS
  Administered 2020-11-09: 100 ug via INTRAVENOUS

## 2020-11-09 MED ORDER — DROPERIDOL 2.5 MG/ML IJ SOLN: 0.6250 mg | Freq: Once | INTRAMUSCULAR | Status: DC | PRN

## 2020-11-09 MED ORDER — HYDROMORPHONE HCL 1 MG/ML IJ SOLN
0.2500 mg | INTRAMUSCULAR | Status: DC | PRN
  Administered 2020-11-09 (×3): 0.5 mg via INTRAVENOUS

## 2020-11-09 MED ORDER — DIPHENHYDRAMINE HCL 50 MG/ML IJ SOLN
INTRAMUSCULAR | Status: DC | PRN
  Administered 2020-11-09: 12.5 mg via INTRAVENOUS

## 2020-11-09 MED ORDER — MIDAZOLAM HCL 5 MG/5ML IJ SOLN
INTRAMUSCULAR | Status: DC | PRN
  Administered 2020-11-09: 2 mg via INTRAVENOUS

## 2020-11-09 MED ORDER — LIDOCAINE 2% (20 MG/ML) 5 ML SYRINGE
INTRAMUSCULAR | Status: AC
  Filled 2020-11-09: qty 5

## 2020-11-09 MED ORDER — BUPIVACAINE LIPOSOME 1.3 % IJ SUSP
INTRAMUSCULAR | Status: DC | PRN
  Administered 2020-11-09: 20 mL

## 2020-11-09 MED ORDER — ONDANSETRON HCL 4 MG PO TABS: 4.0000 mg | ORAL_TABLET | Freq: Four times a day (QID) | ORAL | Status: DC | PRN

## 2020-11-09 MED ORDER — ROCURONIUM BROMIDE 10 MG/ML (PF) SYRINGE
PREFILLED_SYRINGE | INTRAVENOUS | Status: AC
  Filled 2020-11-09: qty 10

## 2020-11-09 MED ORDER — OXYCODONE HCL 5 MG PO TABS: 5.0000 mg | ORAL_TABLET | ORAL | Status: DC | PRN

## 2020-11-09 MED ORDER — PANTOPRAZOLE SODIUM 40 MG PO TBEC
40.0000 mg | DELAYED_RELEASE_TABLET | Freq: Every day | ORAL | Status: DC
  Administered 2020-11-10: 40 mg via ORAL
  Filled 2020-11-09: qty 1

## 2020-11-09 MED ORDER — MENTHOL 3 MG MT LOZG: 1.0000 | LOZENGE | OROMUCOSAL | Status: DC | PRN

## 2020-11-09 MED ORDER — SUGAMMADEX SODIUM 200 MG/2ML IV SOLN
INTRAVENOUS | Status: DC | PRN
Start: 2020-11-09 — End: 2020-11-09
  Administered 2020-11-09: 160 mg via INTRAVENOUS

## 2020-11-09 MED ORDER — CEFAZOLIN SODIUM-DEXTROSE 2-4 GM/100ML-% IV SOLN
2.0000 g | INTRAVENOUS | Status: AC
  Administered 2020-11-09: 2 g via INTRAVENOUS
  Filled 2020-11-09: qty 100

## 2020-11-09 MED ORDER — CHLORHEXIDINE GLUCONATE 0.12 % MT SOLN
15.0000 mL | Freq: Once | OROMUCOSAL | Status: AC
  Administered 2020-11-09: 15 mL via OROMUCOSAL
  Filled 2020-11-09: qty 15

## 2020-11-09 MED ORDER — SODIUM CHLORIDE 0.9 % IV SOLN
250.0000 mL | INTRAVENOUS | Status: DC
  Administered 2020-11-09: 250 mL via INTRAVENOUS

## 2020-11-09 MED ORDER — HYDROCODONE-ACETAMINOPHEN 5-325 MG PO TABS
1.0000 | ORAL_TABLET | ORAL | Status: DC | PRN
  Administered 2020-11-09 – 2020-11-10 (×5): 2 via ORAL
  Filled 2020-11-09 (×5): qty 2

## 2020-11-09 MED ORDER — HYDROMORPHONE HCL 1 MG/ML IJ SOLN
INTRAMUSCULAR | Status: AC
  Filled 2020-11-09: qty 1

## 2020-11-09 MED ORDER — DIAZEPAM 5 MG PO TABS
10.0000 mg | ORAL_TABLET | Freq: Every day | ORAL | Status: DC
  Administered 2020-11-09: 10 mg via ORAL
  Filled 2020-11-09: qty 2

## 2020-11-09 MED ORDER — PHENYLEPHRINE 40 MCG/ML (10ML) SYRINGE FOR IV PUSH (FOR BLOOD PRESSURE SUPPORT)
PREFILLED_SYRINGE | INTRAVENOUS | Status: DC | PRN
  Administered 2020-11-09: 40 ug via INTRAVENOUS

## 2020-11-09 MED ORDER — OXYCODONE HCL 5 MG PO TABS: 10.0000 mg | ORAL_TABLET | ORAL | Status: DC | PRN

## 2020-11-09 MED ORDER — ONDANSETRON HCL 4 MG/2ML IJ SOLN
INTRAMUSCULAR | Status: AC
  Filled 2020-11-09: qty 2

## 2020-11-09 MED ORDER — MIDAZOLAM HCL 2 MG/2ML IJ SOLN
INTRAMUSCULAR | Status: AC
  Filled 2020-11-09: qty 2

## 2020-11-09 MED ORDER — CYCLOBENZAPRINE HCL 10 MG PO TABS: 10.0000 mg | ORAL_TABLET | Freq: Three times a day (TID) | ORAL | Status: DC | PRN

## 2020-11-09 MED ORDER — BUPIVACAINE-EPINEPHRINE (PF) 0.25% -1:200000 IJ SOLN
INTRAMUSCULAR | Status: DC | PRN
  Administered 2020-11-09: 10 mL

## 2020-11-09 MED ORDER — CEFAZOLIN SODIUM-DEXTROSE 2-4 GM/100ML-% IV SOLN
2.0000 g | Freq: Three times a day (TID) | INTRAVENOUS | Status: AC
  Administered 2020-11-09 – 2020-11-10 (×2): 2 g via INTRAVENOUS
  Filled 2020-11-09 (×2): qty 100

## 2020-11-09 MED ORDER — ONDANSETRON HCL 4 MG/2ML IJ SOLN: 4.0000 mg | Freq: Four times a day (QID) | INTRAMUSCULAR | Status: DC | PRN

## 2020-11-09 MED ORDER — THROMBIN 5000 UNITS EX SOLR
OROMUCOSAL | Status: DC | PRN
  Administered 2020-11-09: 5 mL via TOPICAL

## 2020-11-09 MED ORDER — PROPOFOL 500 MG/50ML IV EMUL
INTRAVENOUS | Status: DC | PRN
  Administered 2020-11-09: 25 ug/kg/min via INTRAVENOUS

## 2020-11-09 MED ORDER — SODIUM CHLORIDE 0.9% FLUSH: 3.0000 mL | INTRAVENOUS | Status: DC | PRN

## 2020-11-09 MED ORDER — DEXAMETHASONE SODIUM PHOSPHATE 10 MG/ML IJ SOLN
INTRAMUSCULAR | Status: AC
  Filled 2020-11-09: qty 1

## 2020-11-09 MED ORDER — ONDANSETRON HCL 4 MG/2ML IJ SOLN
INTRAMUSCULAR | Status: DC | PRN
  Administered 2020-11-09: 4 mg via INTRAVENOUS

## 2020-11-09 MED ORDER — PROPOFOL 10 MG/ML IV BOLUS
INTRAVENOUS | Status: AC
  Filled 2020-11-09: qty 20

## 2020-11-09 MED ORDER — PROPOFOL 10 MG/ML IV BOLUS
INTRAVENOUS | Status: DC | PRN
  Administered 2020-11-09: 150 mg via INTRAVENOUS

## 2020-11-09 MED ORDER — BUPIVACAINE-EPINEPHRINE (PF) 0.25% -1:200000 IJ SOLN
INTRAMUSCULAR | Status: AC
  Filled 2020-11-09: qty 30

## 2020-11-09 MED ORDER — ACETAMINOPHEN 325 MG PO TABS: 650.0000 mg | ORAL_TABLET | ORAL | Status: DC | PRN

## 2020-11-09 MED ORDER — 0.9 % SODIUM CHLORIDE (POUR BTL) OPTIME
TOPICAL | Status: DC | PRN
  Administered 2020-11-09: 1000 mL

## 2020-11-09 MED ORDER — ACETAMINOPHEN 650 MG RE SUPP: 650.0000 mg | RECTAL | Status: DC | PRN

## 2020-11-09 MED ORDER — ROSUVASTATIN CALCIUM 20 MG PO TABS
20.0000 mg | ORAL_TABLET | Freq: Every day | ORAL | Status: DC
  Administered 2020-11-10: 20 mg via ORAL
  Filled 2020-11-09: qty 1

## 2020-11-09 MED ORDER — THROMBIN 5000 UNITS EX SOLR
CUTANEOUS | Status: AC
  Filled 2020-11-09: qty 5000

## 2020-11-09 MED ORDER — ROCURONIUM BROMIDE 10 MG/ML (PF) SYRINGE
PREFILLED_SYRINGE | INTRAVENOUS | Status: DC | PRN
  Administered 2020-11-09: 20 mg via INTRAVENOUS
  Administered 2020-11-09 (×2): 30 mg via INTRAVENOUS
  Administered 2020-11-09: 50 mg via INTRAVENOUS
  Administered 2020-11-09: 30 mg via INTRAVENOUS
  Administered 2020-11-09: 20 mg via INTRAVENOUS

## 2020-11-09 MED ORDER — DEXAMETHASONE SODIUM PHOSPHATE 10 MG/ML IJ SOLN
INTRAMUSCULAR | Status: DC | PRN
  Administered 2020-11-09: 10 mg via INTRAVENOUS

## 2020-11-09 MED ORDER — LAMOTRIGINE 100 MG PO TABS
100.0000 mg | ORAL_TABLET | Freq: Two times a day (BID) | ORAL | Status: DC
  Administered 2020-11-09 – 2020-11-10 (×2): 100 mg via ORAL
  Filled 2020-11-09 (×3): qty 1

## 2020-11-09 MED ORDER — MORPHINE SULFATE (PF) 4 MG/ML IV SOLN
4.0000 mg | INTRAVENOUS | Status: DC | PRN
Start: 2020-11-09 — End: 2020-11-10

## 2020-11-09 MED ORDER — PHENOL 1.4 % MT LIQD: 1.0000 | OROMUCOSAL | Status: DC | PRN

## 2020-11-09 MED ORDER — DOCUSATE SODIUM 100 MG PO CAPS
100.0000 mg | ORAL_CAPSULE | Freq: Two times a day (BID) | ORAL | Status: DC
  Administered 2020-11-09 – 2020-11-10 (×2): 100 mg via ORAL
  Filled 2020-11-09 (×2): qty 1

## 2020-11-09 MED ORDER — LACTATED RINGERS IV SOLN: INTRAVENOUS | Status: DC

## 2020-11-09 MED ORDER — BISACODYL 10 MG RE SUPP: 10.0000 mg | Freq: Every day | RECTAL | Status: DC | PRN

## 2020-11-09 MED ORDER — SODIUM CHLORIDE 0.9% FLUSH
3.0000 mL | Freq: Two times a day (BID) | INTRAVENOUS | Status: DC
  Administered 2020-11-09 (×2): 3 mL via INTRAVENOUS

## 2020-11-09 SURGICAL SUPPLY — 68 items
APL SKNCLS STERI-STRIP NONHPOA (GAUZE/BANDAGES/DRESSINGS) ×1
BAG COUNTER SPONGE SURGICOUNT (BAG) ×3 IMPLANT
BAG SPNG CNTER NS LX DISP (BAG) ×2
BASKET BONE COLLECTION (BASKET) ×2 IMPLANT
BENZOIN TINCTURE PRP APPL 2/3 (GAUZE/BANDAGES/DRESSINGS) ×2 IMPLANT
BLADE CLIPPER SURG (BLADE) IMPLANT
BUR MATCHSTICK NEURO 3.0 LAGG (BURR) ×2 IMPLANT
BUR PRECISION FLUTE 6.0 (BURR) ×2 IMPLANT
CANISTER SUCT 3000ML PPV (MISCELLANEOUS) ×2 IMPLANT
CAP LOCK DLX THRD (Cap) ×4 IMPLANT
CARTRIDGE OIL MAESTRO DRILL (MISCELLANEOUS) ×1 IMPLANT
CNTNR URN SCR LID CUP LEK RST (MISCELLANEOUS) ×1 IMPLANT
CONT SPEC 4OZ STRL OR WHT (MISCELLANEOUS) ×2
COVER BACK TABLE 60X90IN (DRAPES) ×2 IMPLANT
DECANTER SPIKE VIAL GLASS SM (MISCELLANEOUS) ×2 IMPLANT
DIFFUSER DRILL AIR PNEUMATIC (MISCELLANEOUS) ×2 IMPLANT
DRAPE C-ARM 42X72 X-RAY (DRAPES) ×4 IMPLANT
DRAPE HALF SHEET 40X57 (DRAPES) ×3 IMPLANT
DRAPE LAPAROTOMY 100X72X124 (DRAPES) ×2 IMPLANT
DRAPE SURG 17X23 STRL (DRAPES) ×8 IMPLANT
DRAPE U-SHAPE 47X51 STRL (DRAPES) ×2 IMPLANT
DRSG OPSITE POSTOP 4X6 (GAUZE/BANDAGES/DRESSINGS) ×2 IMPLANT
ELECT BLADE 4.0 EZ CLEAN MEGAD (MISCELLANEOUS) ×2
ELECT REM PT RETURN 9FT ADLT (ELECTROSURGICAL) ×2
ELECTRODE BLDE 4.0 EZ CLN MEGD (MISCELLANEOUS) ×1 IMPLANT
ELECTRODE REM PT RTRN 9FT ADLT (ELECTROSURGICAL) ×1 IMPLANT
EVACUATOR 1/8 PVC DRAIN (DRAIN) IMPLANT
GAUZE 4X4 16PLY ~~LOC~~+RFID DBL (SPONGE) ×2 IMPLANT
GLOVE EXAM NITRILE XL STR (GLOVE) IMPLANT
GLOVE SURG ENC MOIS LTX SZ8 (GLOVE) ×4 IMPLANT
GLOVE SURG ENC MOIS LTX SZ8.5 (GLOVE) ×4 IMPLANT
GOWN STRL REUS W/ TWL LRG LVL3 (GOWN DISPOSABLE) IMPLANT
GOWN STRL REUS W/ TWL XL LVL3 (GOWN DISPOSABLE) ×2 IMPLANT
GOWN STRL REUS W/TWL 2XL LVL3 (GOWN DISPOSABLE) IMPLANT
GOWN STRL REUS W/TWL LRG LVL3 (GOWN DISPOSABLE)
GOWN STRL REUS W/TWL XL LVL3 (GOWN DISPOSABLE) ×4
HEMOSTAT POWDER KIT SURGIFOAM (HEMOSTASIS) ×2 IMPLANT
KIT BASIN OR (CUSTOM PROCEDURE TRAY) ×2 IMPLANT
KIT GRAFTMAG DEL NEURO DISP (NEUROSURGERY SUPPLIES) ×1 IMPLANT
KIT TURNOVER KIT B (KITS) ×2 IMPLANT
MILL MEDIUM DISP (BLADE) ×1 IMPLANT
NDL HYPO 21X1.5 SAFETY (NEEDLE) IMPLANT
NEEDLE HYPO 21X1.5 SAFETY (NEEDLE) ×2 IMPLANT
NEEDLE HYPO 22GX1.5 SAFETY (NEEDLE) ×2 IMPLANT
NS IRRIG 1000ML POUR BTL (IV SOLUTION) ×2 IMPLANT
OIL CARTRIDGE MAESTRO DRILL (MISCELLANEOUS) ×2
PACK LAMINECTOMY NEURO (CUSTOM PROCEDURE TRAY) ×2 IMPLANT
PAD ARMBOARD 7.5X6 YLW CONV (MISCELLANEOUS) ×6 IMPLANT
PATTIES SURGICAL .5 X1 (DISPOSABLE) IMPLANT
PATTIES SURGICAL 1X1 (DISPOSABLE) ×1 IMPLANT
PUTTY DBM 10CC CALC GRAN (Putty) ×1 IMPLANT
ROD CREO DLX CVD 6.35X40 (Rod) IMPLANT
ROD CURVED TI 6.35X40 (Rod) ×4 IMPLANT
SCREW PA DLX CREO 7.5X50 (Screw) ×2 IMPLANT
SCREW PA DLX CREO 7.5X55 (Screw) ×2 IMPLANT
SPACER ALTERA 10X31-15 (Spacer) ×1 IMPLANT
SPONGE NEURO XRAY DETECT 1X3 (DISPOSABLE) IMPLANT
SPONGE SURGIFOAM ABS GEL 100 (HEMOSTASIS) IMPLANT
SPONGE T-LAP 4X18 ~~LOC~~+RFID (SPONGE) ×1 IMPLANT
STRIP CLOSURE SKIN 1/2X4 (GAUZE/BANDAGES/DRESSINGS) ×2 IMPLANT
SUT VIC AB 1 CT1 18XBRD ANBCTR (SUTURE) ×2 IMPLANT
SUT VIC AB 1 CT1 8-18 (SUTURE) ×4
SUT VIC AB 2-0 CP2 18 (SUTURE) ×4 IMPLANT
SYR 20ML LL LF (SYRINGE) ×1 IMPLANT
TOWEL GREEN STERILE (TOWEL DISPOSABLE) ×2 IMPLANT
TOWEL GREEN STERILE FF (TOWEL DISPOSABLE) ×2 IMPLANT
TRAY FOLEY MTR SLVR 16FR STAT (SET/KITS/TRAYS/PACK) ×2 IMPLANT
WATER STERILE IRR 1000ML POUR (IV SOLUTION) ×2 IMPLANT

## 2020-11-09 NOTE — Anesthesia Preprocedure Evaluation (Addendum)
Anesthesia Evaluation  Patient identified by MRN, date of birth, ID band Patient awake    Reviewed: Allergy & Precautions, H&P , NPO status , Patient's Chart, lab work & pertinent test results  History of Anesthesia Complications (+) PONV and history of anesthetic complications  Airway Mallampati: II  TM Distance: >3 FB Neck ROM: Full    Dental  (+) Teeth Intact, Dental Advisory Given   Pulmonary sleep apnea , former smoker,    breath sounds clear to auscultation       Cardiovascular + Peripheral Vascular Disease   Rhythm:Regular Rate:Normal     Neuro/Psych  Headaches, PSYCHIATRIC DISORDERS Anxiety Depression  Neuromuscular disease    GI/Hepatic GERD  Medicated and Controlled,  Endo/Other    Renal/GU      Musculoskeletal  (+) Arthritis , Fibromyalgia -  Abdominal   Peds  Hematology   Anesthesia Other Findings   Reproductive/Obstetrics                            Anesthesia Physical Anesthesia Plan  ASA: 2  Anesthesia Plan: General   Post-op Pain Management:    Induction: Intravenous  PONV Risk Score and Plan: 4 or greater and Ondansetron, Dexamethasone, Treatment may vary due to age or medical condition, Midazolam, Propofol infusion and Diphenhydramine  Airway Management Planned: Oral ETT and Video Laryngoscope Planned  Additional Equipment: None  Intra-op Plan:   Post-operative Plan: Extubation in OR  Informed Consent: I have reviewed the patients History and Physical, chart, labs and discussed the procedure including the risks, benefits and alternatives for the proposed anesthesia with the patient or authorized representative who has indicated his/her understanding and acceptance.     Dental advisory given  Plan Discussed with: CRNA  Anesthesia Plan Comments:        Anesthesia Quick Evaluation

## 2020-11-09 NOTE — Therapy (Signed)
[facility] [address] [place], [place], [address] Phone: [phone]   Fax:  [phone]  Physical Therapy Evaluation and Discharge Summary   PHYSICAL THERAPY DISCHARGE SUMMARY  Visits from Start of Care: None  Current functional level related to goals / functional outcomes: No treatment   Remaining deficits: Unchanged    Education / Equipment: No treatment    Patient agrees to discharge. Patient goals were not met. Patient is being discharged due to  patient did not wish to have therapy. Amore Grater P. Helene Kelp PT, MPH 11/09/20 12:41 PM   Patient Details  Name: Benjamin Warren MRN: [id number] Date of Birth: 03/15/1960 Referring Provider (PT): Dr Newman Pies   Encounter Date: 10/11/2020    Past Medical History:  Diagnosis Date   Anxiety 11/21/2016   Arthritis    Chronic pain syndrome    Crepitus of left TMJ on opening of jaw 11/21/2016   Depression    Fever blister 11/21/2016   Fibromyalgia    GERD (gastroesophageal reflux disease)    Left rotator cuff tear 10/09/2012   Male hypogonadism 11/21/2016   PONV (postoperative nausea and vomiting)    Sleep apnea    had test-no dr told him he needed cpap   TMJ syndrome    Vitamin D deficiency 11/21/2016    Past Surgical History:  Procedure Laterality Date   ELBOW ARTHROPLASTY  1996   right   [place]   rt wrist   ORIF WRIST FRACTURE  1997   right   ROTATOR CUFF REPAIR Right    January 2021   SHOULDER ARTHROSCOPY  1996   right   SHOULDER ARTHROSCOPY WITH ROTATOR CUFF REPAIR AND SUBACROMIAL DECOMPRESSION Left 10/09/2012   Procedure: LEFT SHOULDER ARTHROSCOPY WITH SUBACROMIAL DECOMPRESSION, DISTAL CLAVICLE EXCISION DEBRIDEMENT AND ROTATOR CUFF REPAIR;  Surgeon: Johnny Bridge, MD;  Location: [facility];  Service: Orthopedics;  Laterality: Left;   VASECTOMY      There were no vitals filed for this  visit.                    Objective measurements completed on examination: See above findings.                  PT Short Term Goals - 10/11/20 1125       PT SHORT TERM GOAL #1   Title Assessment and instruction in exercise    Time 1    Period Days    Status Achieved    Target Date 10/11/20                        Patient will benefit from skilled therapeutic intervention in order to improve the following deficits and impairments:  Decreased range of motion, Decreased activity tolerance, Pain, Impaired flexibility, Improper body mechanics, Decreased mobility, Postural dysfunction  Visit Diagnosis: Chronic bilateral low back pain with bilateral sciatica - Plan: PT plan of care cert/re-cert  Other symptoms and signs involving the musculoskeletal system - Plan: PT plan of care cert/re-cert  Abnormal posture - Plan: PT plan of care cert/re-cert     Problem List Patient Active Problem List   Diagnosis Date Noted   Avulsion fracture of ankle 08/23/2020   Impingement syndrome, shoulder, left 06/05/2020   Well adult exam 01/03/2020   Peripheral arterial disease ([place]) 08/03/2019   Impingement syndrome, shoulder, right 01/12/2019   Lumbar spinal stenosis 06/29/2018   Trochanteric  bursitis, right hip 07/08/2017   Primary osteoarthritis of first carpometacarpal joint of left hand 03/18/2017   Chronic headaches 11/21/2016   Anxiety 11/21/2016   Male hypogonadism 11/21/2016   Vitamin D deficiency 11/21/2016   Tremor 10/16/2016   Insomnia 09/27/2016   Memory loss 09/27/2016   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 07/08/2016   Routine general medical examination at a health care facility 11/26/2014   Primary osteoarthritis of both hands 11/25/2014   Allergic rhinitis 12/23/2013   Fibromyalgia    Chronic pain syndrome    Depression    GERD (gastroesophageal reflux disease)    OSA (obstructive sleep apnea)     Osteoarthritis 08/10/2012   Polyarthralgia 10/03/2010    Perpetua Elling Nilda Simmer, PT, MPH  11/09/2020, 12:40 PM  [facility] [place] [address] [place], [place], [address] Phone: [phone]   Fax:  [phone]  Name: Benjamin Warren MRN: [id number] Date of Birth: [birth year]

## 2020-11-09 NOTE — Progress Notes (Signed)
Orthopedic Tech Progress Note Patient Details:  Benjamin Warren [birth year] [id number] Patient has brace Patient ID: Benjamin Warren, male   DOB: February 12, 1960, 60 y.o.   MRN: [id number]  [name] 11/09/2020, 10:06 PM

## 2020-11-09 NOTE — H&P (Signed)
Subjective: The patient is a 60 year old white male who is complaining of back and bilateral leg pain consistent with neurogenic claudication.  He has failed medical management.  He was worked up with a lumbar MRI and lumbar x-rays which demonstrated a lumbar spine listhesis with spinal stenosis.  I discussed the various treatment options with him.  He has decided to proceed with surgery.  Past Medical History:  Diagnosis Date   Anxiety 11/21/2016   Arthritis    Chronic pain syndrome    Crepitus of left TMJ on opening of jaw 11/21/2016   Depression    Fever blister 11/21/2016   Fibromyalgia    GERD (gastroesophageal reflux disease)    Left rotator cuff tear 10/09/2012   Male hypogonadism 11/21/2016   PONV (postoperative nausea and vomiting)    Sleep apnea    had test-no dr told him he needed cpap   TMJ syndrome    Vitamin D deficiency 11/21/2016    Past Surgical History:  Procedure Laterality Date   ELBOW ARTHROPLASTY  1996   right   HARDWARE REMOVAL  1997   rt wrist   ORIF WRIST FRACTURE  1997   right   ROTATOR CUFF REPAIR Right    January 2021   SHOULDER ARTHROSCOPY  1996   right   SHOULDER ARTHROSCOPY WITH ROTATOR CUFF REPAIR AND SUBACROMIAL DECOMPRESSION Left 10/09/2012   Procedure: LEFT SHOULDER ARTHROSCOPY WITH SUBACROMIAL DECOMPRESSION, DISTAL CLAVICLE EXCISION DEBRIDEMENT AND ROTATOR CUFF REPAIR;  Surgeon: Eulas Post, MD;  [facility];  Service: Orthopedics;  Laterality: Left;   VASECTOMY      No Known Allergies  Social History   Tobacco Use   Smoking status: Former    Packs/day: 0.50    Years: 6.00    Pack years: 3.00    Types: Cigarettes    Quit date: 10/03/1982    Years since quitting: 38.1   Smokeless tobacco: Never  Substance Use Topics   Alcohol use: Yes    Comment: occ    Family History  Problem Relation Age of Onset   Alcohol abuse Mother    Hypertension Mother    Alcohol abuse Other        FAMILY HISTORY    Arthritis Other        FAMILY HISTORY   Stroke Other    Pancreatic cancer Brother    Liver cancer Brother    Prior to Admission medications   Medication Sig Start Date End Date Taking? Authorizing Provider  cilostazol (PLETAL) 100 MG tablet Take 1 tablet (100 mg total) by mouth 2 (two) times daily. Will need labs before refills 10/02/20  Yes Monica Becton, MD  diazepam (VALIUM) 10 MG tablet Take 20 mg by mouth at bedtime.   Yes [provider]  etodolac (LODINE XL) 600 MG 24 hr tablet TAKE 1 TABLET BY MOUTH TWICE A DAY 05/26/20  Yes Monica Becton, MD  lamoTRIgine (LAMICTAL) 100 MG tablet Take 100 mg by mouth 2 (two) times daily.   Yes [provider]  omeprazole (PRILOSEC) 20 MG capsule Take 1 capsule (20 mg total) by mouth daily. 08/23/20  Yes Everrett Coombe, DO  omeprazole (PRILOSEC) 40 MG capsule TAKE 1 CAPSULE BY MOUTH EVERY DAY Patient taking differently: Take 40 mg by mouth daily as needed (acid reflux). 12/14/19  Yes Myrlene Broker, MD  rosuvastatin (CRESTOR) 20 MG tablet TAKE 1 TABLET BY MOUTH EVERY DAY 09/14/20  Yes Monica Becton, MD  Misc.  Devices MISC Oral Sleep Device to be used every night during sleep. 10/03/14   Newt Lukes, MD  valACYclovir (VALTREX) 1000 MG tablet Take 1 tablet (1,000 mg total) by mouth 2 (two) times daily. Patient taking differently: Take 1,000 mg by mouth 2 (two) times daily as needed (fever blister). 08/10/20   Everrett Coombe, DO     Review of Systems  Positive ROS: As above  All other systems have been reviewed and were otherwise negative with the exception of those mentioned in the HPI and as above.  Objective: Vital signs in last 24 hours: Temp:  [97.9 F (36.6 C)] 97.9 F (36.6 C) (10/27 0851) Pulse Rate:  [70] 70 (10/27 0851) Resp:  [17] 17 (10/27 0851) BP: (147)/(80) 147/80 (10/27 0851) SpO2:  [98 %] 98 % (10/27 0851) Weight:  [83.9 kg] 83.9 kg (10/27 0851) Estimated body mass index is  26.54 kg/m as calculated from the following:   Height as of this encounter: 5\' 10"  (1.778 m).   Weight as of this encounter: 83.9 kg.   General Appearance: Alert Head: Normocephalic, without obvious abnormality, atraumatic Eyes: PERRL, conjunctiva/corneas clear, EOM's intact,    Ears: Normal  Throat: Normal  Neck: Supple, Back: unremarkable Lungs: Clear to auscultation bilaterally, respirations unlabored Heart: Regular rate and rhythm, no murmur, rub or gallop Abdomen: Soft, non-tender Extremities: Extremities normal, atraumatic, no cyanosis or edema Skin: unremarkable  NEUROLOGIC:   Mental status: alert and oriented,Motor Exam - grossly normal Sensory Exam - grossly normal Reflexes:  Coordination - grossly normal Gait - grossly normal Balance - grossly normal Cranial Nerves: I: smell Not tested  II: visual acuity  OS: Normal  OD: Normal   II: visual fields Full to confrontation  II: pupils Equal, round, reactive to light  III,VII: ptosis None  III,IV,VI: extraocular muscles  Full ROM  V: mastication Normal  V: facial light touch sensation  Normal  V,VII: corneal reflex  Present  VII: facial muscle function - upper  Normal  VII: facial muscle function - lower Normal  VIII: hearing Not tested  IX: soft palate elevation  Normal  IX,X: gag reflex Present  XI: trapezius strength  5/5  XI: sternocleidomastoid strength 5/5  XI: neck flexion strength  5/5  XII: tongue strength  Normal    Data Review Lab Results  Component Value Date   WBC 7.3 11/06/2020   HGB 14.7 11/06/2020   HCT 44.3 11/06/2020   MCV 94.7 11/06/2020   PLT 299 11/06/2020   Lab Results  Component Value Date   NA 139 11/06/2020   K 4.4 11/06/2020   CL 104 11/06/2020   CO2 27 11/06/2020   BUN 20 11/06/2020   CREATININE 1.13 11/06/2020   GLUCOSE 95 11/06/2020   No results found for: INR, PROTIME  Assessment/Plan: Lumbar spinal stasis, lumbar spinal stenosis, lumbago, lumbar radiculopathy,  neurogenic claudication: I have discussed the situation with the patient.  I reviewed his imaging studies with him and pointed out the abnormalities.  We have discussed the various treatment options including surgery.  I have described the surgical treatment option of an L4-5 decompression, instrumentation and fusion.  I have shown him surgical models.  I have given him a surgical pamphlet.  We have discussed the risk, benefits, alternatives, expected postoperative course, and likelihood of achieving our goals with surgery.  I have answered all his questions.  He has decided proceed with surgery.   11/08/2020 11/09/2020 10:04 AM

## 2020-11-09 NOTE — Anesthesia Procedure Notes (Signed)
Procedure Name: Intubation Date/Time: 11/09/2020 10:47 AM Performed by: Elliot Dally, CRNA Pre-anesthesia Checklist: Patient identified, Emergency Drugs available, Suction available and Patient being monitored Patient Re-evaluated:Patient Re-evaluated prior to induction Oxygen Delivery Method: Circle System Utilized Preoxygenation: Pre-oxygenation with 100% oxygen Induction Type: IV induction Ventilation: Mask ventilation without difficulty Laryngoscope Size: Glidescope and 4 Grade View: Grade I Tube type: Oral Tube size: 7.5 mm Number of attempts: 1 Airway Equipment and Method: Stylet and Oral airway Placement Confirmation: ETT inserted through vocal cords under direct vision, positive ETCO2 and breath sounds checked- equal and bilateral Secured at: 21 cm Tube secured with: Tape Dental Injury: Teeth and Oropharynx as per pre-operative assessment  Comments: Elective glide scope intubation due to patient history of grade 3 view and sore throat post procedure

## 2020-11-09 NOTE — Transfer of Care (Signed)
Immediate Anesthesia Transfer of Care Note  Patient: Benjamin Warren  Procedure(s) Performed: POSTERIOR LUMBAR INTERBODY FUSION, INTERBODY PROSTHESIS,POSTERIOR INSTRUMENTATION LUMBAR FOUR-FIVE  Patient Location: PACU  Anesthesia Type:General  Level of Consciousness: drowsy  Airway & Oxygen Therapy: Patient Spontanous Breathing and Patient connected to nasal cannula oxygen  Post-op Assessment: Report given to RN and Post -op Vital signs reviewed and stable  Post vital signs: Reviewed and stable  Last Vitals:  Vitals Value Taken Time  BP 146/83 11/09/20 1427  Temp    Pulse 76 11/09/20 1429  Resp 20 11/09/20 1429  SpO2 99 % 11/09/20 1429  Vitals shown include unvalidated device data.  Last Pain:  Vitals:   11/09/20 0904  TempSrc:   PainSc: 7          Complications: No notable events documented.

## 2020-11-09 NOTE — Op Note (Signed)
Brief history: The patient is a 60 year old white male who has complained of back and leg pain consistent with neurogenic claudication.  He has failed medical management and was worked up with lumbar x-rays and lumbar MRI which demonstrated L4-5 spinal listhesis, facet arthropathy, spinal stenosis, etc.  I discussed the various treatment options with him.  He has decided proceed with surgery.  Preoperative diagnosis: L4-5 spondylolisthesis, degenerative disc disease, spinal stenosis compressing both the L4 and the L5 nerve roots; lumbago; lumbar radiculopathy; neurogenic claudication  Postoperative diagnosis: The same  Procedure: Bilateral L4-5 laminotomy/foraminotomies/medial facetectomy to decompress the bilateral L4 and L5 nerve roots(the work required to do this was in addition to the work required to do the posterior lumbar interbody fusion because of the patient's spinal stenosis, facet arthropathy. Etc. requiring a wide decompression of the nerve roots.);  L4-5 transforaminal lumbar interbody fusion with local morselized autograft bone and Zimmer DBM; insertion of interbody prosthesis at L4-5 (globus peek expandable interbody prosthesis); posterior nonsegmental instrumentation from L4 to L5 with globus titanium pedicle screws and rods; posterior lateral arthrodesis at L4-5 with local morselized autograft bone and Zimmer DBM.  Surgeon: Dr. Delma Officer  Asst.: Dr. Lisbeth Renshaw  Anesthesia: Gen. endotracheal  Estimated blood loss: 200 cc  Drains: None  Complications: None  Description of procedure: The patient was brought to the operating room by the anesthesia team. General endotracheal anesthesia was induced. The patient was turned to the prone position on the Wilson frame. The patient's lumbosacral region was then prepared with Betadine scrub and Betadine solution. Sterile drapes were applied.  I then injected the area to be incised with Marcaine with epinephrine solution. I then  used the scalpel to make a linear midline incision over the L4-5 interspace. I then used electrocautery to perform a bilateral subperiosteal dissection exposing the spinous process and lamina of L4-5. We then obtained intraoperative radiograph to confirm our location. We then inserted the Verstrac retractor to provide exposure.  I began the decompression by using the high speed drill to perform laminotomies at L4-5 bilaterally. We then used the Kerrison punches to widen the laminotomy and removed the ligamentum flavum at L4-5 bilaterally. We used the Kerrison punches to remove the medial facets at L4-5, we removed the right L4-5 facet. We performed wide foraminotomies about the bilateral L4 and L5 nerve roots completing the decompression.  We now turned our attention to the posterior lumbar interbody fusion. I used a scalpel to incise the intervertebral disc at L4-5 bilaterally. I then performed a partial intervertebral discectomy at L4-5 bilaterally using the pituitary forceps. We prepared the vertebral endplates at L4-5 bilaterally for the fusion by removing the soft tissues with the curettes. We then used the trial spacers to pick the appropriate sized interbody prosthesis. We prefilled his prosthesis with a combination of local morselized autograft bone that we obtained during the decompression as well as Zimmer DBM. We inserted the prefilled prosthesis into the interspace at L4-5 from the right, we then turned and expanded the prosthesis. There was a good snug fit of the prosthesis in the interspace. We then filled and the remainder of the intervertebral disc space with local morselized autograft bone and Zimmer DBM. This completed the posterior lumbar interbody arthrodesis.  During the decompression and insertion of the prosthesis the assistant protected the thecal sac and nerve roots with the D'Errico retractor.  We now turned attention to the instrumentation. Under fluoroscopic guidance we cannulated  the bilateral L4 and L5 pedicles with the  bone probe. We then removed the bone probe. We then tapped the pedicle with a 6.5 millimeter tap. We then removed the tap. We probed inside the tapped pedicle with a ball probe to rule out cortical breaches. We then inserted a 7.5 x 50 and 55 millimeter pedicle screw into the L4 and L5 pedicles bilaterally under fluoroscopic guidance. We then palpated along the medial aspect of the pedicles to rule out cortical breaches. There were none. The nerve roots were not injured. We then connected the unilateral pedicle screws with a lordotic rod. We compressed the construct and secured the rod in place with the caps. We then tightened the caps appropriately. This completed the instrumentation from L4-5 bilaterally.  We now turned our attention to the posterior lateral arthrodesis at L4-5. We used the high-speed drill to decorticate the remainder of the facets, pars, transverse process at L4-5. We then applied a combination of local morselized autograft bone and Zimmer DBM over these decorticated posterior lateral structures. This completed the posterior lateral arthrodesis.  We then obtained hemostasis using bipolar electrocautery. We irrigated the wound out with bacitracin solution. We inspected the thecal sac and nerve roots and noted they were well decompressed. We then removed the retractor.  We injected Exparel . We reapproximated patient's thoracolumbar fascia with interrupted #1 Vicryl suture. We reapproximated patient's subcutaneous tissue with interrupted 2-0 Vicryl suture. The reapproximated patient's skin with Steri-Strips and benzoin. The wound was then coated with bacitracin ointment. A sterile dressing was applied. The drapes were removed. The patient was subsequently returned to the supine position where they were extubated by the anesthesia team. He was then transported to the post anesthesia care unit in stable condition. All sponge instrument and needle counts  were reportedly correct at the end of this case.

## 2020-11-09 NOTE — Progress Notes (Signed)
Glasses, wallet, and cell phone given to wife Lupita Leash in the lobby.

## 2020-11-10 DIAGNOSIS — M48062 Spinal stenosis, lumbar region with neurogenic claudication: Secondary | ICD-10-CM | POA: Diagnosis not present

## 2020-11-10 LAB — BASIC METABOLIC PANEL
Anion gap: 8 (ref 5–15)
BUN: 12 mg/dL (ref 6–20)
CO2: 23 mmol/L (ref 22–32)
Calcium: 8.5 mg/dL — ABNORMAL LOW (ref 8.9–10.3)
Chloride: 105 mmol/L (ref 98–111)
Creatinine, Ser: 1.05 mg/dL (ref 0.61–1.24)
GFR, Estimated: >60 mL/min (ref >60)
Glucose, Bld: 102 mg/dL — ABNORMAL HIGH (ref 70–99)
Potassium: 4.9 mmol/L (ref 3.5–5.1)
Sodium: 136 mmol/L (ref 135–145)

## 2020-11-10 LAB — CBC
HCT: 42.3 % (ref 39.0–52.0)
Hemoglobin: 14.6 g/dL (ref 13.0–17.0)
MCH: 32.2 pg (ref 26.0–34.0)
MCHC: 34.5 g/dL (ref 30.0–36.0)
MCV: 93.2 fL (ref 80.0–100.0)
Platelets: 309 10*3/uL (ref 150–400)
RBC: 4.54 MIL/uL (ref 4.22–5.81)
RDW: 12.5 % (ref 11.5–15.5)
WBC: 20.6 10*3/uL — ABNORMAL HIGH (ref 4.0–10.5)
nRBC: 0 % (ref 0.0–0.2)

## 2020-11-10 MED ORDER — OXYCODONE-ACETAMINOPHEN 5-325 MG PO TABS: 1.0000 | ORAL_TABLET | ORAL | Status: DC | PRN

## 2020-11-10 MED ORDER — DOCUSATE SODIUM 100 MG PO CAPS: 100.0000 mg | ORAL_CAPSULE | Freq: Two times a day (BID) | ORAL | 0 refills | Status: DC

## 2020-11-10 MED ORDER — OXYCODONE-ACETAMINOPHEN 5-325 MG PO TABS: 1.0000 | ORAL_TABLET | ORAL | 0 refills | Status: DC | PRN

## 2020-11-10 MED ORDER — CYCLOBENZAPRINE HCL 10 MG PO TABS: 10.0000 mg | ORAL_TABLET | Freq: Three times a day (TID) | ORAL | 0 refills | Status: DC | PRN

## 2020-11-10 MED FILL — Heparin Sodium (Porcine) Inj 1000 Unit/ML: INTRAMUSCULAR | Qty: 30 | Status: AC

## 2020-11-10 MED FILL — Sodium Chloride IV Soln 0.9%: INTRAVENOUS | Qty: 1000 | Status: AC

## 2020-11-10 NOTE — Evaluation (Signed)
Occupational Therapy Evaluation Patient Details Name: Benjamin Warren MRN: [id number] DOB: 08-25-1960 Today's Date: 11/10/2020   History of Present Illness Pt is a 60 y/o male who presents s/p L4-L5 PLIF on 11/09/2020. PMH significant for chronic pain syndrome, TMJ, fibromyalgia, L RCR.   Clinical Impression   Pt admitted for procedure listed above. PTA pt reported that he was independent with all ADL's, however due to pain required frequent rest breaks and needed to complete many tasks in sitting. At this time, pt reports that he feels no pain down his legs anymore, only achy around the surgical site. He is able to complete all ADL's with no assist, using compensatory strategies. Pt has no further OT needs and acute OT will sign off.       Recommendations for follow up therapy are one component of a multi-disciplinary discharge planning process, led by the attending physician.  Recommendations may be updated based on patient status, additional functional criteria and insurance authorization.   Follow Up Recommendations  No OT follow up    Assistance Recommended at Discharge None  Functional Status Assessment  Patient has not had a recent decline in their functional status  Equipment Recommendations  None recommended by OT    Recommendations for Other Services       Precautions / Restrictions Precautions Precautions: Back Precaution Booklet Issued: Yes (comment) Precaution Comments: Reviewed handout and pt was cued for precautions during functional mobility. Required Braces or Orthoses: Spinal Brace Spinal Brace: Lumbar corset;Applied in standing position Restrictions Weight Bearing Restrictions: No      Mobility Bed Mobility Overal bed mobility: Modified Independent             General bed mobility comments: HOB flat and rails lowered to simulate home environment    Transfers Overall transfer level: Modified independent Equipment used: None                General transfer comment: Pt demonstrated proper hand placement on seated surface for safety. Additionally able to complete from toilet not pushing up from any surface. No assist needed.      Balance Overall balance assessment: Needs assistance Sitting-balance support: Feet supported;No upper extremity supported Sitting balance-Leahy Scale: Good     Standing balance support: No upper extremity supported Standing balance-Leahy Scale: Fair                             ADL either performed or assessed with clinical judgement   ADL Overall ADL's : Modified independent                                       General ADL Comments: Pt able to complete all ADL's with no difficulties this session, following compensatory strategies for safety     Vision Baseline Vision/History: 1 Wears glasses Ability to See in Adequate Light: 0 Adequate Patient Visual Report: No change from baseline Vision Assessment?: No apparent visual deficits     Perception     Praxis      Pertinent Vitals/Pain Pain Assessment: 0-10 Pain Score: 5  Pain Location: incision site Pain Descriptors / Indicators: Operative site guarding;Aching Pain Intervention(s): Monitored during session;Repositioned     Hand Dominance Right   Extremity/Trunk Assessment Upper Extremity Assessment Upper Extremity Assessment: Overall WFL for tasks assessed (arthritis in hands)   Lower Extremity Assessment Lower Extremity Assessment:  Defer to PT evaluation   Cervical / Trunk Assessment Cervical / Trunk Assessment: Back Surgery   Communication Communication Communication: No difficulties   Cognition Arousal/Alertness: Awake/alert Behavior During Therapy: WFL for tasks assessed/performed Overall Cognitive Status: Within Functional Limits for tasks assessed                                       General Comments  VSS on RA    Exercises     Shoulder Instructions      Home  Living Family/patient expects to be discharged to:: Private residence Living Arrangements: Spouse/significant other Available Help at Discharge: Family;Available 24 hours/day Type of Home: House Home Access: Stairs to enter [employer] of Steps: 2   Home Layout: One level (2 small steps down to a den)     Bathroom Shower/Tub: Chief Strategy Officer: Handicapped height     Home Equipment: None          Prior Functioning/Environment Prior Level of Function : Independent/Modified Independent             Mobility Comments: Activity level limited by pain. 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 ADLs Comments: Activity tolerance limited, required breaks due to pain        OT Problem List: Decreased strength;Decreased activity tolerance;Impaired balance (sitting and/or standing)      OT Treatment/Interventions:      OT Goals(Current goals can be found in the care plan section) Acute Rehab OT Goals Patient Stated Goal: To play his base standing up OT Goal Formulation: All assessment and education complete, DC therapy Time For Goal Achievement: 11/10/20 Potential to Achieve Goals: Good  OT Frequency:     Barriers to D/C:            Co-evaluation              AM-PAC OT "6 Clicks" Daily Activity     Outcome Measure Help from another person eating meals?: None Help from another person taking care of personal grooming?: None Help from another person toileting, which includes using toliet, bedpan, or urinal?: None Help from another person bathing (including washing, rinsing, drying)?: None Help from another person to put on and taking off regular upper body clothing?: None Help from another person to put on and taking off regular lower body clothing?: None 6 Click Score: 24   End of Session Equipment Utilized During Treatment: Back brace Nurse Communication: Mobility status  Activity Tolerance: Patient  tolerated treatment well Patient left: in chair;with call bell/phone within reach  OT Visit Diagnosis: Unsteadiness on feet (R26.81);Other abnormalities of gait and mobility (R26.89);Muscle weakness (generalized) (M62.81)                Time: 7824-2353 OT Time Calculation (min): 24 min Charges:  OT General Charges $OT Visit: 1 Visit OT Evaluation $OT Eval Low Complexity: 1 Low OT Treatments $Self Care/Home Management : 8-22 mins  Benjamin Warren H., OTR/L Acute Rehabilitation  Benjamin Warren Benjamin Warren 11/10/2020, 9:50 AM

## 2020-11-10 NOTE — Evaluation (Signed)
Physical Therapy Evaluation and Discharge Patient Details Name: Benjamin Warren MRN: [id number] DOB: 1960-04-12 Today's Date: 11/10/2020  History of Present Illness  Pt is a 60 y/o male who presents s/p L4-L5 PLIF on 11/09/2020. PMH significant for chronic pain syndrome, TMJ, fibromyalgia, L RCR.   Clinical Impression  Patient evaluated by Physical Therapy with no further acute PT needs identified. All education has been completed and the patient has no further questions. Pt was able to demonstrate transfers and ambulation with gross modified independence and no AD. Pt was educated on precautions, brace application/wearing schedule, appropriate activity progression, and car transfer. See below for any follow-up Physical Therapy or equipment needs. PT is signing off. Thank you for this referral.        Recommendations for follow up therapy are one component of a multi-disciplinary discharge planning process, led by the attending physician.  Recommendations may be updated based on patient status, additional functional criteria and insurance authorization.  Follow Up Recommendations No PT follow up    Assistance Recommended at Discharge PRN  Functional Status Assessment Patient has had a recent decline in their functional status and demonstrates the ability to make significant improvements in function in a reasonable and predictable amount of time.  Equipment Recommendations  None recommended by PT    Recommendations for Other Services       Precautions / Restrictions Precautions Precautions: Back Precaution Booklet Issued: Yes (comment) Precaution Comments: Reviewed handout and pt was cued for precautions during functional mobility. Required Braces or Orthoses: Spinal Brace Spinal Brace: Lumbar corset;Applied in standing position Restrictions Weight Bearing Restrictions: No      Mobility  Bed Mobility Overal bed mobility: Modified Independent             General bed mobility  comments: HOB flat and rails lowered to simulate home environment    Transfers Overall transfer level: Modified independent Equipment used: None               General transfer comment: Pt demonstrated proper hand placement on seated surface for safety. No assist required.    Ambulation/Gait Ambulation/Gait assistance: Modified independent (Device/Increase time) Gait Distance (Feet): 450 Feet Assistive device: None Gait Pattern/deviations: WFL(Within Functional Limits) Gait velocity: Decreased Gait velocity interpretation: 1.31 - 2.62 ft/sec, indicative of limited community ambulator General Gait Details: VC's for improved posture. No unsteadiness or LOB noted.  Stairs Stairs: Yes Stairs assistance: Modified independent (Device/Increase time) Stair Management: One rail Right;Alternating pattern;Forwards Number of Stairs: 3 General stair comments: VC's for sequencing and general safety. No unsteadiness noted.  Wheelchair Mobility    Modified Rankin (Stroke Patients Only)       Balance Overall balance assessment: Needs assistance Sitting-balance support: Feet supported;No upper extremity supported Sitting balance-Leahy Scale: Fair     Standing balance support: No upper extremity supported Standing balance-Leahy Scale: Fair                               Pertinent Vitals/Pain Pain Assessment: 0-10 Pain Score: 5  Pain Location: incision site, supine in bed Pain Descriptors / Indicators: Operative site guarding;Aching    Home Living Family/patient expects to be discharged to:: Private residence Living Arrangements: Spouse/significant other Available Help at Discharge: Family;Available 24 hours/day Type of Home: House Home Access: Stairs to enter   [employer] of Steps: 2   Home Layout: One level (2 small steps down to a den)  Prior Function Prior Level of Function : Independent/Modified Independent             Mobility  Comments: Activity level limited by pain. 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       Hand Dominance        Extremity/Trunk Assessment   Upper Extremity Assessment Upper Extremity Assessment: Overall WFL for tasks assessed (Arthritis in hands)    Lower Extremity Assessment Lower Extremity Assessment: Generalized weakness (Mild; consistent with pre-op diagnosis)    Cervical / Trunk Assessment Cervical / Trunk Assessment: Back Surgery  Communication   Communication: No difficulties  Cognition Arousal/Alertness: Awake/alert Behavior During Therapy: WFL for tasks assessed/performed Overall Cognitive Status: Within Functional Limits for tasks assessed                                          General Comments      Exercises     Assessment/Plan    PT Assessment Patient does not need any further PT services  PT Problem List         PT Treatment Interventions      PT Goals (Current goals can be found in the Care Plan section)  Acute Rehab PT Goals Patient Stated Goal: Home today, be able to get back to walking for exercise and 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 PT Goal Formulation: All assessment and education complete, DC therapy    Frequency     Barriers to discharge        Co-evaluation               AM-PAC PT "6 Clicks" Mobility  Outcome Measure Help needed turning from your back to your side while in a flat bed without using bedrails?: None Help needed moving from lying on your back to sitting on the side of a flat bed without using bedrails?: None Help needed moving to and from a bed to a chair (including a wheelchair)?: None Help needed standing up from a chair using your arms (e.g., wheelchair or bedside chair)?: None Help needed to walk in hospital room?: None Help needed climbing 3-5 steps with a railing? : None 6 Click Score: 24    End of Session Equipment Utilized During Treatment: Back brace Activity  Tolerance: Patient tolerated treatment well Patient left: in chair;with call bell/phone within reach Nurse Communication: Mobility status PT Visit Diagnosis: Unsteadiness on feet (R26.81);Pain Pain - part of body:  (back)    Time: 1779-3903 PT Time Calculation (min) (ACUTE ONLY): 21 min   Charges:   PT Evaluation $PT Eval Low Complexity: 1 Low          Conni Slipper, PT, DPT Acute Rehabilitation Services Pager: [phone] Office: [phone]   Marylynn Pearson 11/10/2020, 9:44 AM

## 2020-11-10 NOTE — Progress Notes (Signed)
Pt. discharged home accompanied by husband. Prescriptions and discharge instructions given with verbalization of understanding. Incision site on back with no s/s of infection - no swelling, redness, bleeding, and/or drainage noted. Opportunity given to ask questions but no question asked. Pt. transported out of this unit in wheelchair by the volunteer   

## 2020-11-10 NOTE — Discharge Summary (Signed)
Physician Discharge Summary  Patient ID: Benjamin Warren MRN: [id number] DOB/AGE: 08-05-1960 60 y.o.  Admit date: 11/09/2020 Discharge date: 11/10/2020  Admission Diagnoses: Lumbar spondylolisthesis, lumbar spinal stenosis, lumbar facet arthropathy, lumbar radiculopathy, neurogenic claudication, lumbago  Discharge Diagnoses: The same Active Problems:   Spondylolisthesis, lumbar region   Discharged Condition: good  Hospital Course: I performed an L4-5 decompression, instrumentation and fusion on the patient on 11/09/2020.  The surgery went well.  The patient's postoperative course was unremarkable.  On postoperative day #1 the patient requested discharge home.  The patient, and his wife, were given verbal and written discharge instructions.  All of their questions were answered.  Consults: PT, OT, care management Significant Diagnostic Studies: None Treatments: L4-5 decompression, instrumentation and fusion. Discharge Exam: Blood pressure (!) 106/53, pulse 70, temperature 98.8 F (37.1 C), temperature source Oral, resp. rate 18, height 5\' 10"  (1.778 m), weight 83.9 kg, SpO2 98 %. The patient is alert and pleasant.  His dressing is clean and dry.  His strength is normal.  Disposition: Home  Discharge Instructions     Call MD for:  difficulty breathing, headache or visual disturbances   Complete by: As directed    Call MD for:  extreme fatigue   Complete by: As directed    Call MD for:  hives   Complete by: As directed    Call MD for:  persistant dizziness or light-headedness   Complete by: As directed    Call MD for:  persistant nausea and vomiting   Complete by: As directed    Call MD for:  redness, tenderness, or signs of infection (pain, swelling, redness, odor or green/yellow discharge around incision site)   Complete by: As directed    Call MD for:  severe uncontrolled pain   Complete by: As directed    Call MD for:  temperature >100.4   Complete by: As directed     Diet - low sodium heart healthy   Complete by: As directed    Discharge instructions   Complete by: As directed    Call [phone] for a followup appointment. Take a stool softener while you are using pain medications.   Driving Restrictions   Complete by: As directed    Do not drive for 2 weeks.   Increase activity slowly   Complete by: As directed    Lifting restrictions   Complete by: As directed    Do not lift more than 5 pounds. No excessive bending or twisting.   May shower / Bathe   Complete by: As directed    Remove the dressing for 3 days after surgery.  You may shower, but leave the incision alone.   Remove dressing in 48 hours   Complete by: As directed       Allergies as of 11/10/2020   No Known Allergies      Medication List     STOP taking these medications    diazepam 10 MG tablet Commonly known as: VALIUM   etodolac 600 MG 24 hr tablet Commonly known as: LODINE XL       TAKE these medications    cilostazol 100 MG tablet Commonly known as: PLETAL Take 1 tablet (100 mg total) by mouth 2 (two) times daily. Will need labs before refills   cyclobenzaprine 10 MG tablet Commonly known as: FLEXERIL Take 1 tablet (10 mg total) by mouth 3 (three) times daily as needed for muscle spasms.   docusate sodium 100 MG capsule Commonly known  as: COLACE Take 1 capsule (100 mg total) by mouth 2 (two) times daily.   lamoTRIgine 100 MG tablet Commonly known as: LAMICTAL Take 100 mg by mouth 2 (two) times daily.   Misc. Devices Misc Oral Sleep Device to be used every night during sleep.   omeprazole 40 MG capsule Commonly known as: PRILOSEC TAKE 1 CAPSULE BY MOUTH EVERY DAY What changed:  how much to take when to take this reasons to take this   omeprazole 20 MG capsule Commonly known as: PRILOSEC Take 1 capsule (20 mg total) by mouth daily. What changed: Another medication with the same name was changed. Make sure you understand how and when to take  each.   oxyCODONE-acetaminophen 5-325 MG tablet Commonly known as: PERCOCET/ROXICET Take 1-2 tablets by mouth every 4 (four) hours as needed for moderate pain.   rosuvastatin 20 MG tablet Commonly known as: CRESTOR TAKE 1 TABLET BY MOUTH EVERY DAY   valACYclovir 1000 MG tablet Commonly known as: VALTREX Take 1 tablet (1,000 mg total) by mouth 2 (two) times daily. What changed:  when to take this reasons to take this         Signed: Cristi Loron 11/10/2020, 6:48 AM

## 2020-11-10 NOTE — Anesthesia Postprocedure Evaluation (Signed)
Anesthesia Post Note  Patient: Benjamin Warren  Procedure(s) Performed: POSTERIOR LUMBAR INTERBODY FUSION, INTERBODY PROSTHESIS,POSTERIOR INSTRUMENTATION LUMBAR FOUR-FIVE     Patient location during evaluation: PACU Anesthesia Type: General Level of consciousness: sedated and patient cooperative Pain management: pain level controlled Vital Signs Assessment: post-procedure vital signs reviewed and stable Respiratory status: spontaneous breathing Cardiovascular status: stable Anesthetic complications: no   No notable events documented.  Last Vitals:  Vitals:   11/10/20 0326 11/10/20 0735  BP: (!) 106/53 133/82  Pulse: 70 72  Resp: 18 18  Temp: 37.1 C 36.8 C  SpO2: 98% 99%    Last Pain:  Vitals:   11/10/20 0735  TempSrc: Oral  PainSc:                  Lewie Loron

## 2020-11-21 ENCOUNTER — Ambulatory Visit: Payer: 59 | Admitting: Sports Medicine

## 2020-12-18 ENCOUNTER — Other Ambulatory Visit: Payer: Self-pay | Admitting: Sports Medicine

## 2020-12-18 DIAGNOSIS — N139 Obstructive and reflux uropathy, unspecified: Secondary | ICD-10-CM

## 2020-12-18 DIAGNOSIS — I739 Peripheral vascular disease, unspecified: Secondary | ICD-10-CM

## 2020-12-18 DIAGNOSIS — R252 Cramp and spasm: Secondary | ICD-10-CM

## 2020-12-18 MED ORDER — CILOSTAZOL 100 MG PO TABS: 100.0000 mg | ORAL_TABLET | Freq: Two times a day (BID) | ORAL | 3 refills | Status: DC

## 2020-12-18 NOTE — Progress Notes (Signed)
Labs ordered.

## 2020-12-28 ENCOUNTER — Other Ambulatory Visit: Payer: Self-pay | Admitting: Sports Medicine

## 2020-12-28 DIAGNOSIS — I739 Peripheral vascular disease, unspecified: Secondary | ICD-10-CM

## 2020-12-28 DIAGNOSIS — R252 Cramp and spasm: Secondary | ICD-10-CM

## 2020-12-28 NOTE — Telephone Encounter (Signed)
Patient coming tomorrow to do labs.

## 2021-01-01 ENCOUNTER — Telehealth: Payer: Self-pay

## 2021-01-01 LAB — COMPREHENSIVE METABOLIC PANEL
AG Ratio: 1.7 (calc) (ref 1.0–2.5)
ALT: 27 U/L (ref 9–46)
AST: 24 U/L (ref 10–35)
Albumin: 4.3 g/dL (ref 3.6–5.1)
Alkaline phosphatase (APISO): 83 U/L (ref 35–144)
BUN: 17 mg/dL (ref 7–25)
CO2: 27 mmol/L (ref 20–32)
Calcium: 9.4 mg/dL (ref 8.6–10.3)
Chloride: 106 mmol/L (ref 98–110)
Creat: 1.15 mg/dL (ref 0.70–1.30)
Globulin: 2.6 g/dL (calc) (ref 1.9–3.7)
Glucose, Bld: 99 mg/dL (ref 65–99)
Potassium: 4.7 mmol/L (ref 3.5–5.3)
Sodium: 140 mmol/L (ref 135–146)
Total Bilirubin: 0.6 mg/dL (ref 0.2–1.2)
Total Protein: 6.9 g/dL (ref 6.1–8.1)

## 2021-01-01 LAB — CBC
HCT: 46.4 % (ref 38.5–50.0)
Hemoglobin: 15.7 g/dL (ref 13.2–17.1)
MCH: 31.4 pg (ref 27.0–33.0)
MCHC: 33.8 g/dL (ref 32.0–36.0)
MCV: 92.8 fL (ref 80.0–100.0)
MPV: 10.3 fL (ref 7.5–12.5)
Platelets: 323 10*3/uL (ref 140–400)
RBC: 5 10*6/uL (ref 4.20–5.80)
RDW: 12.8 % (ref 11.0–15.0)
WBC: 10.8 10*3/uL (ref 3.8–10.8)

## 2021-01-01 LAB — PSA, TOTAL AND FREE
PSA, % Free: 29 % (calc) (ref >25)
PSA, Free: 0.5 ng/mL
PSA, Total: 1.7 ng/mL (ref <=4.0)

## 2021-01-01 LAB — LIPID PANEL
Cholesterol: 147 mg/dL (ref <200)
HDL: 36 mg/dL — ABNORMAL LOW (ref >=40)
LDL Cholesterol (Calc): 88 mg/dL (calc)
Non-HDL Cholesterol (Calc): 111 mg/dL (calc) (ref <130)
Total CHOL/HDL Ratio: 4.1 (calc) (ref <5.0)
Triglycerides: 131 mg/dL (ref <150)

## 2021-01-01 LAB — TSH: TSH: 1.93 mIU/L (ref 0.40–4.50)

## 2021-01-01 NOTE — Telephone Encounter (Signed)
Patient reports he had back surgery 5 weeks ago and no one told him when to restart his Pletal. He has not restarted it yet. Please advise.

## 2021-01-01 NOTE — Telephone Encounter (Signed)
He is okay to restart now, however before he does has he noted any worsening in symptoms since coming off of the Pletal?  If nothing is worse then he can stay off of it.

## 2021-01-01 NOTE — Telephone Encounter (Signed)
Patient and wife aware of Dr. Karie Schwalbe recommendations and will discuss with Dr. Ashley Royalty at his January physical.

## 2021-01-13 ENCOUNTER — Encounter: Payer: Self-pay | Admitting: Sports Medicine

## 2021-01-16 NOTE — Telephone Encounter (Signed)
He can restart but if symptoms haven't worsened with being off of this there is really no reason to re-start.

## 2021-01-16 NOTE — Telephone Encounter (Signed)
He can restart but if symptoms haven't worsened with being off of this there is really no reason to re-start.    In regards to new appointment cost it depends on complexity and what is done at appt.

## 2021-01-17 ENCOUNTER — Encounter: Payer: Self-pay | Admitting: Family Medicine

## 2021-01-24 ENCOUNTER — Other Ambulatory Visit: Payer: Self-pay

## 2021-01-24 ENCOUNTER — Ambulatory Visit (INDEPENDENT_AMBULATORY_CARE_PROVIDER_SITE_OTHER): Payer: 59 | Admitting: Family Medicine

## 2021-01-24 ENCOUNTER — Encounter: Payer: Self-pay | Admitting: Family Medicine

## 2021-01-24 VITALS — BP 118/74 | HR 76 | Temp 98.9°F | Ht 70.0 in | Wt 190.4 lb

## 2021-01-24 DIAGNOSIS — R251 Tremor, unspecified: Secondary | ICD-10-CM | POA: Diagnosis not present

## 2021-01-24 DIAGNOSIS — Z125 Encounter for screening for malignant neoplasm of prostate: Secondary | ICD-10-CM | POA: Diagnosis not present

## 2021-01-24 DIAGNOSIS — E559 Vitamin D deficiency, unspecified: Secondary | ICD-10-CM

## 2021-01-24 DIAGNOSIS — R7309 Other abnormal glucose: Secondary | ICD-10-CM

## 2021-01-24 DIAGNOSIS — Z0000 Encounter for general adult medical examination without abnormal findings: Secondary | ICD-10-CM | POA: Diagnosis not present

## 2021-01-24 NOTE — Patient Instructions (Signed)

## 2021-01-24 NOTE — Progress Notes (Signed)
RAOUL CIANO - 61 y.o. male MRN [id number]  Date of birth: 1961/01/02  Subjective Chief Complaint  Patient presents with   Annual Exam    HPI Jeannett Senior is a 61 year old male here today for annual exam.  Reports overall he is doing well.  Denies new concerns or questions today.  He would like to have updated labs completed.  Chronic medical conditions remain well controlled with current medications.  He does try to stay fairly active.  Feels like his diet is pretty healthy.  He is a non-smoker.  He occasionally consumes alcohol.  Review of Systems  Constitutional:  Negative for chills, fever, malaise/fatigue and weight loss.  HENT:  Negative for congestion, ear pain and sore throat.   Eyes:  Negative for blurred vision, double vision and pain.  Respiratory:  Negative for cough and shortness of breath.   Cardiovascular:  Negative for chest pain and palpitations.  Gastrointestinal:  Negative for abdominal pain, blood in stool, constipation, heartburn and nausea.  Genitourinary:  Negative for dysuria and urgency.  Musculoskeletal:  Negative for joint pain and myalgias.  Neurological:  Negative for dizziness and headaches.  Endo/Heme/Allergies:  Does not bruise/bleed easily.  Psychiatric/Behavioral:  Negative for depression. The patient is not nervous/anxious and does not have insomnia.     No Known Allergies  Past Medical History:  Diagnosis Date   Anxiety 11/21/2016   Arthritis    Chronic pain syndrome    Crepitus of left TMJ on opening of jaw 11/21/2016   Depression    Fever blister 11/21/2016   Fibromyalgia    GERD (gastroesophageal reflux disease)    Left rotator cuff tear 10/09/2012   Male hypogonadism 11/21/2016   PONV (postoperative nausea and vomiting)    Sleep apnea    had test-no dr told him he needed cpap   TMJ syndrome    Vitamin D deficiency 11/21/2016    Past Surgical History:  Procedure Laterality Date   ELBOW ARTHROPLASTY  1996   right   HARDWARE  REMOVAL  1997   rt wrist   ORIF WRIST FRACTURE  1997   right   ROTATOR CUFF REPAIR Right    January 2021   SHOULDER ARTHROSCOPY  1996   right   SHOULDER ARTHROSCOPY WITH ROTATOR CUFF REPAIR AND SUBACROMIAL DECOMPRESSION Left 10/09/2012   Procedure: LEFT SHOULDER ARTHROSCOPY WITH SUBACROMIAL DECOMPRESSION, DISTAL CLAVICLE EXCISION DEBRIDEMENT AND ROTATOR CUFF REPAIR;  Surgeon: Eulas Post, MD;  Location: [facility];  Service: Orthopedics;  Laterality: Left;   VASECTOMY      Social History   Socioeconomic History   Marital status: Married    Spouse name: Not on file   Number of children: Not on file   Years of education: Not on file   Highest education level: Not on file  Occupational History   Not on file  Tobacco Use   Smoking status: Former    Packs/day: 0.50    Years: 6.00    Pack years: 3.00    Types: Cigarettes    Quit date: 10/03/1982    Years since quitting: 38.3   Smokeless tobacco: Never  Vaping Use   Vaping Use: Never used  Substance and Sexual Activity   Alcohol use: Yes    Comment: occ   Drug use: Never   Sexual activity: Yes    Birth control/protection: Post-menopausal  Other Topics Concern   Not on file  Social History Narrative   Not on file   Social Determinants  of Health   Financial Resource Strain: Not on file  Food Insecurity: Not on file  Transportation Needs: Not on file  Physical Activity: Not on file  Stress: Not on file  Social Connections: Not on file    Family History  Problem Relation Age of Onset   Alcohol abuse Mother    Hypertension Mother    Alcohol abuse Other        FAMILY HISTORY   Arthritis Other        FAMILY HISTORY   Stroke Other    Pancreatic cancer Brother    Liver cancer Brother     Health Maintenance  Topic Date Due   Zoster Vaccines- Shingrix (2 of 2) 02/01/2019   COVID-19 Vaccine (4 - Booster for Moderna series) 02/17/2020   COLONOSCOPY (Pts 45-74yrs Insurance coverage will need to be  confirmed)  10/15/2023   TETANUS/TDAP  06/23/2026   INFLUENZA VACCINE  Completed   Hepatitis C Screening  Completed   HIV Screening  Completed   Pneumococcal Vaccine 39-54 Years old  Aged Out   HPV VACCINES  Aged Out     ----------------------------------------------------------------------------------------------------------------------------------------------------------------------------------------------------------------- Physical Exam BP 118/74 (BP Location: Left Arm, Patient Position: Sitting, Cuff Size: Normal)    Pulse 76    Temp 98.9 F (37.2 C)    Ht 5\' 10"  (1.778 m)    Wt 190 lb 6.4 oz (86.4 kg)    SpO2 95%    BMI 27.32 kg/m   Physical Exam Constitutional:      General: He is not in acute distress. HENT:     Head: Normocephalic and atraumatic.     Right Ear: Tympanic membrane and external ear normal.     Left Ear: Tympanic membrane and external ear normal.  Eyes:     General: No scleral icterus. Neck:     Thyroid: No thyromegaly.  Cardiovascular:     Rate and Rhythm: Normal rate and regular rhythm.     Heart sounds: Normal heart sounds.  Pulmonary:     Effort: Pulmonary effort is normal.     Breath sounds: Normal breath sounds.  Abdominal:     General: Bowel sounds are normal. There is no distension.     Palpations: Abdomen is soft.     Tenderness: There is no abdominal tenderness. There is no guarding.  Musculoskeletal:     Cervical back: Normal range of motion.  Lymphadenopathy:     Cervical: No cervical adenopathy.  Skin:    General: Skin is warm and dry.     Findings: No rash.  Neurological:     Mental Status: He is alert and oriented to person, place, and time.     Cranial Nerves: No cranial nerve deficit.     Motor: No abnormal muscle tone.  Psychiatric:        Mood and Affect: Mood normal.        Behavior: Behavior normal.     ------------------------------------------------------------------------------------------------------------------------------------------------------------------------------------------------------------------- Assessment and Plan  Well adult exam Well adult Orders Placed This Encounter  Procedures   COMPLETE METABOLIC PANEL WITH GFR   CBC with Differential   Lipid Panel w/reflex Direct LDL   TSH   PSA   Vitamin D (25 hydroxy)   HgB A1c  Screening: Up-to-date Immunizations: Up-to-date Anticipatory guidance/risk factor reduction: Recommendations per AVS.   No orders of the defined types were placed in this encounter.   No follow-ups on file.    This visit occurred during the SARS-CoV-2 public health emergency.  Safety  Safety protocols were in place, including screening questions prior to the visit, additional usage of staff PPE, and extensive cleaning of exam room while observing appropriate contact time as indicated for disinfecting solutions.

## 2021-01-24 NOTE — Assessment & Plan Note (Signed)
Well adult Orders Placed This Encounter  Procedures   COMPLETE METABOLIC PANEL WITH GFR   CBC with Differential   Lipid Panel w/reflex Direct LDL   TSH   PSA   Vitamin D (25 hydroxy)   HgB A1c  Screening: Up-to-date Immunizations: Up-to-date Anticipatory guidance/risk factor reduction: Recommendations per AVS.

## 2021-01-28 ENCOUNTER — Encounter: Payer: Self-pay | Admitting: Family Medicine

## 2021-02-01 ENCOUNTER — Encounter: Payer: Self-pay | Admitting: Family Medicine

## 2021-02-01 DIAGNOSIS — H02402 Unspecified ptosis of left eyelid: Secondary | ICD-10-CM

## 2021-02-01 NOTE — Telephone Encounter (Signed)
Referral entered  

## 2021-02-02 LAB — COMPLETE METABOLIC PANEL WITH GFR
AG Ratio: 2 (calc) (ref 1.0–2.5)
ALT: 17 U/L (ref 9–46)
AST: 20 U/L (ref 10–35)
Albumin: 4.4 g/dL (ref 3.6–5.1)
Alkaline phosphatase (APISO): 75 U/L (ref 35–144)
BUN: 25 mg/dL (ref 7–25)
CO2: 29 mmol/L (ref 20–32)
Calcium: 9.4 mg/dL (ref 8.6–10.3)
Chloride: 106 mmol/L (ref 98–110)
Creat: 1.23 mg/dL (ref 0.70–1.30)
Globulin: 2.2 g/dL (calc) (ref 1.9–3.7)
Glucose, Bld: 108 mg/dL — ABNORMAL HIGH (ref 65–99)
Potassium: 4.5 mmol/L (ref 3.5–5.3)
Sodium: 140 mmol/L (ref 135–146)
Total Bilirubin: 0.6 mg/dL (ref 0.2–1.2)
Total Protein: 6.6 g/dL (ref 6.1–8.1)
eGFR: 68 mL/min/{1.73_m2} (ref >=60)

## 2021-02-02 LAB — LIPID PANEL W/REFLEX DIRECT LDL
Cholesterol: 123 mg/dL (ref <200)
HDL: 39 mg/dL — ABNORMAL LOW (ref >=40)
LDL Cholesterol (Calc): 66 mg/dL (calc)
Non-HDL Cholesterol (Calc): 84 mg/dL (calc) (ref <130)
Total CHOL/HDL Ratio: 3.2 (calc) (ref <5.0)
Triglycerides: 95 mg/dL (ref <150)

## 2021-02-02 LAB — CBC WITH DIFFERENTIAL/PLATELET
Absolute Monocytes: 679 cells/uL (ref 200–950)
Basophils Absolute: 49 cells/uL (ref 0–200)
Basophils Relative: 0.7 %
Eosinophils Absolute: 189 cells/uL (ref 15–500)
Eosinophils Relative: 2.7 %
HCT: 44.9 % (ref 38.5–50.0)
Hemoglobin: 15.1 g/dL (ref 13.2–17.1)
Lymphs Abs: 1981 cells/uL (ref 850–3900)
MCH: 31.3 pg (ref 27.0–33.0)
MCHC: 33.6 g/dL (ref 32.0–36.0)
MCV: 93.2 fL (ref 80.0–100.0)
MPV: 10.2 fL (ref 7.5–12.5)
Monocytes Relative: 9.7 %
Neutro Abs: 4102 cells/uL (ref 1500–7800)
Neutrophils Relative %: 58.6 %
Platelets: 331 10*3/uL (ref 140–400)
RBC: 4.82 10*6/uL (ref 4.20–5.80)
RDW: 12.4 % (ref 11.0–15.0)
Total Lymphocyte: 28.3 %
WBC: 7 10*3/uL (ref 3.8–10.8)

## 2021-02-02 LAB — HEMOGLOBIN A1C
Hgb A1c MFr Bld: 5.4 % of total Hgb (ref <5.7)
Mean Plasma Glucose: 108 mg/dL
eAG (mmol/L): 6 mmol/L

## 2021-02-02 LAB — PSA: PSA: 1.78 ng/mL (ref <=4.00)

## 2021-02-02 LAB — TSH: TSH: 1.79 mIU/L (ref 0.40–4.50)

## 2021-02-02 LAB — VITAMIN D 25 HYDROXY (VIT D DEFICIENCY, FRACTURES): Vit D, 25-Hydroxy: 46 ng/mL (ref 30–100)

## 2021-02-13 ENCOUNTER — Other Ambulatory Visit: Payer: Self-pay | Admitting: Internal Medicine

## 2021-02-16 ENCOUNTER — Other Ambulatory Visit: Payer: Self-pay | Admitting: Sports Medicine

## 2021-02-16 DIAGNOSIS — G729 Myopathy, unspecified: Secondary | ICD-10-CM

## 2021-02-22 ENCOUNTER — Other Ambulatory Visit: Payer: Self-pay | Admitting: Sports Medicine

## 2021-02-22 DIAGNOSIS — G729 Myopathy, unspecified: Secondary | ICD-10-CM

## 2021-02-24 ENCOUNTER — Encounter: Payer: Self-pay | Admitting: Sports Medicine

## 2021-02-24 ENCOUNTER — Other Ambulatory Visit: Payer: Self-pay | Admitting: Sports Medicine

## 2021-02-24 DIAGNOSIS — G729 Myopathy, unspecified: Secondary | ICD-10-CM

## 2021-02-25 ENCOUNTER — Other Ambulatory Visit: Payer: Self-pay | Admitting: Family Medicine

## 2021-02-25 DIAGNOSIS — K219 Gastro-esophageal reflux disease without esophagitis: Secondary | ICD-10-CM

## 2021-02-26 ENCOUNTER — Other Ambulatory Visit: Payer: Self-pay | Admitting: Sports Medicine

## 2021-02-26 ENCOUNTER — Encounter: Payer: Self-pay | Admitting: Family Medicine

## 2021-02-26 DIAGNOSIS — I739 Peripheral vascular disease, unspecified: Secondary | ICD-10-CM

## 2021-02-26 DIAGNOSIS — R252 Cramp and spasm: Secondary | ICD-10-CM

## 2021-02-26 DIAGNOSIS — G729 Myopathy, unspecified: Secondary | ICD-10-CM

## 2021-02-26 MED ORDER — ETODOLAC ER 600 MG PO TB24: 600.0000 mg | ORAL_TABLET | Freq: Two times a day (BID) | ORAL | 3 refills | Status: DC

## 2021-05-04 ENCOUNTER — Telehealth: Payer: Self-pay | Admitting: Family Medicine

## 2021-05-04 DIAGNOSIS — I739 Peripheral vascular disease, unspecified: Secondary | ICD-10-CM

## 2021-05-04 DIAGNOSIS — R252 Cramp and spasm: Secondary | ICD-10-CM

## 2021-05-04 MED ORDER — CILOSTAZOL 100 MG PO TABS: 100.0000 mg | ORAL_TABLET | Freq: Two times a day (BID) | ORAL | 3 refills | Status: DC

## 2021-05-04 NOTE — Telephone Encounter (Signed)
The only rx I see that was sent through [facility] was the Pletal.  Re[facility] [place].   ? ?CM

## 2021-05-04 NOTE — Telephone Encounter (Signed)
Patient was in for an appointment with his wife & he stopped by to say that some of his medication was sent through [facility] and that he does not want any medication going through [facility]. ? ?He wants his medications sent to: ? ?[id number] [place], [place] - [address] Phone:  [phone]  ?Fax:  [phone]  ?  ? ?

## 2021-05-04 NOTE — Telephone Encounter (Signed)
Patient notified of information below. AMUCK 

## 2021-06-13 ENCOUNTER — Encounter (INDEPENDENT_AMBULATORY_CARE_PROVIDER_SITE_OTHER): Payer: 59 | Admitting: Sports Medicine

## 2021-06-13 DIAGNOSIS — M2550 Pain in unspecified joint: Secondary | ICD-10-CM

## 2021-06-13 MED ORDER — PREDNISONE 50 MG PO TABS: ORAL_TABLET | ORAL | 0 refills | Status: DC

## 2021-06-13 NOTE — Assessment & Plan Note (Signed)
Increasing arthralgias, vacation coming up, adding a course of prednisone.

## 2021-06-13 NOTE — Telephone Encounter (Signed)
I spent 5 total minutes of online digital evaluation and management services in this patient-initiated request for online care. 

## 2021-06-26 ENCOUNTER — Encounter: Payer: Self-pay | Admitting: Family Medicine

## 2021-06-26 ENCOUNTER — Other Ambulatory Visit: Payer: Self-pay | Admitting: Family Medicine

## 2021-06-26 DIAGNOSIS — K219 Gastro-esophageal reflux disease without esophagitis: Secondary | ICD-10-CM

## 2021-06-26 MED ORDER — VALACYCLOVIR HCL 1 G PO TABS: 1000.0000 mg | ORAL_TABLET | Freq: Two times a day (BID) | ORAL | 1 refills | Status: DC

## 2021-09-25 ENCOUNTER — Ambulatory Visit: Payer: 59 | Admitting: Family Medicine

## 2021-09-25 ENCOUNTER — Encounter: Payer: Self-pay | Admitting: Family Medicine

## 2021-09-25 VITALS — BP 133/75 | HR 54 | Ht 70.0 in | Wt 194.0 lb

## 2021-09-25 DIAGNOSIS — R519 Headache, unspecified: Secondary | ICD-10-CM | POA: Diagnosis not present

## 2021-09-25 DIAGNOSIS — M1990 Unspecified osteoarthritis, unspecified site: Secondary | ICD-10-CM | POA: Diagnosis not present

## 2021-09-25 DIAGNOSIS — G8929 Other chronic pain: Secondary | ICD-10-CM

## 2021-09-25 DIAGNOSIS — R42 Dizziness and giddiness: Secondary | ICD-10-CM

## 2021-09-25 MED ORDER — NURTEC 75 MG PO TBDP: ORAL_TABLET | ORAL | 0 refills | Status: DC

## 2021-09-25 NOTE — Assessment & Plan Note (Signed)
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  He may continue etodolac as needed.  Paraffin wax bath may be helpful.  He can continue to see Dr. Benjamin Stain for hand injections as indicated.

## 2021-09-25 NOTE — Assessment & Plan Note (Signed)
History of multiple medications for management of his headaches.  He has never tried [employer]	[place].  Given trial to see if this is helpful for his headaches.

## 2021-09-25 NOTE — Patient Instructions (Signed)
Try nurtec for migraines.  Let me know if this is helpful.  We'll be in touch with lab results.

## 2021-09-25 NOTE — Assessment & Plan Note (Signed)
He had an isolated episode of dizziness.  Unclear etiology.  Updating labs today.

## 2021-09-25 NOTE — Progress Notes (Signed)
Benjamin Warren - 61 y.o. male MRN [id number]  Date of birth: 1960-09-13  Subjective No chief complaint on file.   HPI Benjamin Warren is a 61 year old male here today for following concerns:  Continues to have intermittent headaches.  Reports previously seeing a headache clinic and had injections in his scalp.  Had severe reaction to one of these injections.  He is unsure what injections he was getting at that time.  Describes reaction as petechial rash.  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  He has tried other medications for headaches in the past which were not effective or side effects were intolerable.  He has never tried any CGRP receptor agonist.  Recently had an episode of dizziness.  Does not recall any precipitating factors before having this.  He had not overly exerting himself.  He has remained fairly well-hydrated and eating regularly during the day.  Dizziness did last for several minutes.  Resolved on its own.  Denies any weakness, numbness or tingling associated with this.  He did not palpitations.  He did check his blood pressure which was normal.  Continues to have hand pain.  This is chronic in nature.  He has seen a hand surgeon in the past and told he had a leg.  He has had injections previously.  Tried oral NSAIDs as well as topical NSAIDs without significant relief.  Etodolac maybe helped some.  ROS:  A comprehensive ROS was completed and negative except as noted per HPI   Past Medical History:  Diagnosis Date   Anxiety 11/21/2016   Arthritis    Chronic pain syndrome    Crepitus of left TMJ on opening of jaw 11/21/2016   Depression    Fever blister 11/21/2016   Fibromyalgia    GERD (gastroesophageal reflux disease)    Left rotator cuff tear 10/09/2012   Male hypogonadism 11/21/2016   PONV (postoperative nausea and vomiting)    Sleep apnea    had test-no dr told him he  needed cpap   TMJ syndrome    Vitamin D deficiency 11/21/2016    Past Surgical History:  Procedure Laterality Date   ELBOW ARTHROPLASTY  1996   right   HARDWARE REMOVAL  1997   rt wrist   ORIF WRIST FRACTURE  1997   right   ROTATOR CUFF REPAIR Right    January 2021   SHOULDER ARTHROSCOPY  1996   right   SHOULDER ARTHROSCOPY WITH ROTATOR CUFF REPAIR AND SUBACROMIAL DECOMPRESSION Left 10/09/2012   Procedure: LEFT SHOULDER ARTHROSCOPY WITH SUBACROMIAL DECOMPRESSION, DISTAL CLAVICLE EXCISION DEBRIDEMENT AND ROTATOR CUFF REPAIR;  Surgeon: Eulas Post, MD;  Location: [facility];  Service: Orthopedics;  Laterality: Left;   VASECTOMY      Social History   Socioeconomic History   Marital status: Married    Spouse name: Not on file   Number of children: Not on file   Years of education: Not on file   Highest education level: Not on file  Occupational History   Not on file  Tobacco Use   Smoking status: Former    Packs/day: 0.50    Years: 6.00    Total pack years: 3.00    Types: Cigarettes    Quit date: 10/03/1982    Years since quitting: 39.0   Smokeless tobacco: Never  Vaping Use   Vaping Use: Never used  Substance and  Sexual Activity   Alcohol use: Yes    Comment: occ   Drug use: Never   Sexual activity: Yes    Birth control/protection: Post-menopausal  Other Topics Concern   Not on file  Social History Narrative   Not on file   Social Determinants of Health   Financial Resource Strain: Not on file  Food Insecurity: Not on file  Transportation Needs: Not on file  Physical Activity: Not on file  Stress: Not on file  Social Connections: Not on file    Family History  Problem Relation Age of Onset   Alcohol abuse Mother    Hypertension Mother    Alcohol abuse Other        FAMILY HISTORY   Arthritis Other        FAMILY HISTORY   Stroke Other    Pancreatic cancer Brother    Liver cancer Brother     Health Maintenance  Topic Date Due    INFLUENZA VACCINE  04/14/2022 (Originally 08/14/2021)   COLONOSCOPY (Pts 45-7yrs Insurance coverage will need to be confirmed)  10/15/2023   TETANUS/TDAP  06/23/2026   COVID-19 Vaccine  Completed   Hepatitis C Screening  Completed   HIV Screening  Completed   Zoster Vaccines- Shingrix  Completed   HPV VACCINES  Aged Out     ----------------------------------------------------------------------------------------------------------------------------------------------------------------------------------------------------------------- Physical Exam BP 133/75 (BP Location: Left Arm, Patient Position: Sitting, Cuff Size: Large)   Pulse (!) 54   Ht 5\' 10"  (1.778 m)   Wt 194 lb (88 kg)   SpO2 95%   BMI 27.84 kg/m   Physical Exam Constitutional:      Appearance: Normal appearance.  Eyes:     General: No scleral icterus. Cardiovascular:     Rate and Rhythm: Normal rate and regular rhythm.  Pulmonary:     Effort: Pulmonary effort is normal.     Breath sounds: Normal breath sounds.  Musculoskeletal:     Cervical back: Neck supple.  Neurological:     Mental Status: He is alert.  Psychiatric:        Mood and Affect: Mood normal.        Behavior: Behavior normal.     ------------------------------------------------------------------------------------------------------------------------------------------------------------------------------------------------------------------- Assessment and Plan  Dizziness He had an isolated episode of dizziness.  Unclear etiology.  Updating labs today.  Chronic headaches History of multiple medications for management of his headaches.  He has never tried .  Given trial to see if this is helpful for his headaches.  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  He may continue etodolac as needed.  Paraffin wax bath may be helpful.  He can continue to see Dr. [employer] [facility]	[place] for hand injections as indicated.   Meds ordered this  encounter  Medications   Rimegepant Sulfate (NURTEC) 75 MG TBDP    Sig: Take 75mg  daily as needed for migraine Lot: Benjamin Stain Exp: 08/2023    Dispense:  2 tablet    Refill:  0    No follow-ups on file.    This visit occurred during the SARS-CoV-2 public health emergency.  Safety protocols were in place, including screening questions prior to the visit, additional usage of staff PPE, and extensive cleaning of exam room while observing appropriate contact time as indicated for disinfecting solutions.

## 2021-09-26 LAB — CBC WITH DIFFERENTIAL/PLATELET
Absolute Monocytes: 778 cells/uL (ref 200–950)
Basophils Absolute: 57 cells/uL (ref 0–200)
Basophils Relative: 0.7 %
Eosinophils Absolute: 186 cells/uL (ref 15–500)
Eosinophils Relative: 2.3 %
HCT: 45 % (ref 38.5–50.0)
Hemoglobin: 15.5 g/dL (ref 13.2–17.1)
Lymphs Abs: 2624 cells/uL (ref 850–3900)
MCH: 31.1 pg (ref 27.0–33.0)
MCHC: 34.4 g/dL (ref 32.0–36.0)
MCV: 90.4 fL (ref 80.0–100.0)
MPV: 10 fL (ref 7.5–12.5)
Monocytes Relative: 9.6 %
Neutro Abs: 4455 cells/uL (ref 1500–7800)
Neutrophils Relative %: 55 %
Platelets: 288 10*3/uL (ref 140–400)
RBC: 4.98 10*6/uL (ref 4.20–5.80)
RDW: 12.1 % (ref 11.0–15.0)
Total Lymphocyte: 32.4 %
WBC: 8.1 10*3/uL (ref 3.8–10.8)

## 2021-09-26 LAB — COMPLETE METABOLIC PANEL WITH GFR
AG Ratio: 1.9 (calc) (ref 1.0–2.5)
ALT: 16 U/L (ref 9–46)
AST: 19 U/L (ref 10–35)
Albumin: 4.3 g/dL (ref 3.6–5.1)
Alkaline phosphatase (APISO): 73 U/L (ref 35–144)
BUN: 17 mg/dL (ref 7–25)
CO2: 23 mmol/L (ref 20–32)
Calcium: 9.1 mg/dL (ref 8.6–10.3)
Chloride: 109 mmol/L (ref 98–110)
Creat: 0.98 mg/dL (ref 0.70–1.30)
Globulin: 2.3 g/dL (calc) (ref 1.9–3.7)
Glucose, Bld: 89 mg/dL (ref 65–99)
Potassium: 4.7 mmol/L (ref 3.5–5.3)
Sodium: 140 mmol/L (ref 135–146)
Total Bilirubin: 0.5 mg/dL (ref 0.2–1.2)
Total Protein: 6.6 g/dL (ref 6.1–8.1)
eGFR: 89 mL/min/{1.73_m2} (ref >=60)

## 2021-09-28 ENCOUNTER — Ambulatory Visit: Payer: 59 | Admitting: Sports Medicine

## 2021-09-28 ENCOUNTER — Ambulatory Visit (INDEPENDENT_AMBULATORY_CARE_PROVIDER_SITE_OTHER): Payer: 59

## 2021-09-28 DIAGNOSIS — M7541 Impingement syndrome of right shoulder: Secondary | ICD-10-CM

## 2021-09-28 DIAGNOSIS — M25511 Pain in right shoulder: Secondary | ICD-10-CM

## 2021-09-28 NOTE — Progress Notes (Signed)
    Procedures performed today:    Procedure: Real-time Ultrasound Guided injection of the right subacromial bursa Device: Samsung HS60  Verbal informed consent obtained.  Time-out conducted.  Noted no overlying erythema, induration, or other signs of local infection.  Skin prepped in a sterile fashion.  Local anesthesia: Topical Ethyl chloride.  With sterile technique and under real time ultrasound guidance: Noted intact cuff, 1 cc Kenalog 40, 1 cc lidocaine, 1 cc bupivacaine injected easily Completed without difficulty  Advised to call if fevers/chills, erythema, induration, drainage, or persistent bleeding.  Images permanently stored and available for review in PACS.  Impression: Technically successful ultrasound guided injection.  Procedure: Real-time Ultrasound Guided injection of the right acromioclavicular joint Device: Samsung HS60  Verbal informed consent obtained.  Time-out conducted.  Noted no overlying erythema, induration, or other signs of local infection.  Skin prepped in a sterile fashion.  Local anesthesia: Topical Ethyl chloride.  With sterile technique and under real time ultrasound guidance: Arthritic joint noted, 1/2 cc lidocaine, 1/2 cc kenalog 40 injected easily. Completed without difficulty  Advised to call if fevers/chills, erythema, induration, drainage, or persistent bleeding.  Images permanently stored and available for review in PACS.  Impression: Technically successful ultrasound guided injection.  Independent interpretation of notes and tests performed by another provider:   None.  Brief History, Exam, Impression, and Recommendations:    Impingement syndrome, shoulder, right Benjamin Warren returns, he is a very pleasant 61 year old male, chronic right shoulder pain, he has had arthroscopic surgery with Dr. Dion Saucier in the past. More recently he is having increasing pain anterior shoulder as well as over the acromioclavicular joint. He has multiple positive  exam findings including a positive crossarm sign, pain with resisted external rotation. I think he has 2 pain generators today, the bursa/infraspinatus as well as the acromioclavicular joint. I injected both structures today, I am also going to pull the trigger for x-rays and an MRI without contrast. He will do some home conditioning and return to see me in 6 weeks.    ____________________________________________ [name]. Benjamin Stain, M.D., ABFM., CAQSM., AME. Primary Care and Sports Medicine  [facility]  Adjunct Professor of Family Medicine  [place] [employer]  Restaurant manager, fast food

## 2021-09-28 NOTE — Assessment & Plan Note (Signed)
Benjamin Warren returns, he is a very pleasant 61 year old male, chronic right shoulder pain, he has had arthroscopic surgery with Dr. Dion Saucier in the past. More recently he is having increasing pain anterior shoulder as well as over the acromioclavicular joint. He has multiple positive exam findings including a positive crossarm sign, pain with resisted external rotation. I think he has 2 pain generators today, the bursa/infraspinatus as well as the acromioclavicular joint. I injected both structures today, I am also going to pull the trigger for x-rays and an MRI without contrast. He will do some home conditioning and return to see me in 6 weeks.

## 2021-10-02 ENCOUNTER — Ambulatory Visit: Payer: 59 | Admitting: Sports Medicine

## 2021-10-06 ENCOUNTER — Ambulatory Visit (INDEPENDENT_AMBULATORY_CARE_PROVIDER_SITE_OTHER): Payer: 59

## 2021-10-06 DIAGNOSIS — M25511 Pain in right shoulder: Secondary | ICD-10-CM

## 2021-10-06 DIAGNOSIS — M7541 Impingement syndrome of right shoulder: Secondary | ICD-10-CM

## 2021-10-08 ENCOUNTER — Encounter: Payer: Self-pay | Admitting: Sports Medicine

## 2021-11-21 ENCOUNTER — Ambulatory Visit: Payer: 59 | Admitting: Sports Medicine

## 2021-11-21 DIAGNOSIS — M48062 Spinal stenosis, lumbar region with neurogenic claudication: Secondary | ICD-10-CM | POA: Diagnosis not present

## 2021-11-21 DIAGNOSIS — M7541 Impingement syndrome of right shoulder: Secondary | ICD-10-CM | POA: Diagnosis not present

## 2021-11-21 MED ORDER — ACETAMINOPHEN ER 650 MG PO TBCR: 650.0000 mg | EXTENDED_RELEASE_TABLET | Freq: Three times a day (TID) | ORAL | 3 refills | Status: DC | PRN

## 2021-11-21 NOTE — Progress Notes (Signed)
    Procedures performed today:    None.  Independent interpretation of notes and tests performed by another provider:   None.  Brief History, Exam, Impression, and Recommendations:    Lumbar spinal stenosis Benjamin Warren does have lumbar DDD, he has responded well to L4-L5 interlaminar epidurals. Has not had an epidural for many years. Now having increasing pain left low back with radiation to the lateral hip mostly posterior, hip exam is normal, no pain with internal rotation or over the greater trochanter. Suspect recurrence of discogenic back pain, we will add advanced herniated disc conditioning, he will add arthritis strength Tylenol to his regimen and return to see me as needed.  Impingement syndrome, shoulder, right Chronic right shoulder pain, he is post arthroscopic surgery with Dr. Dion Saucier in the past, he has had subacromial and glenohumeral injections that provided good relief, MRI did show recurrent cuff tearing and glenohumeral osteoarthritis. His last injection was in September so it is too soon to do it again. Adding Tylenol, he will continue this with his etodolac, and return to see me as needed. He is not interested in additional operative intervention.  I spent 30 minutes of total time managing this patient today, this includes chart review, face to face, and non-face to face time.  ____________________________________________ [name]. Benjamin Stain, M.D., ABFM., CAQSM., AME. Primary Care and Sports Medicine [place] [facility]  Adjunct Professor of Family Medicine  [place] [employer]  Restaurant manager, fast food

## 2021-11-21 NOTE — Assessment & Plan Note (Signed)
Chronic right shoulder pain, he is post arthroscopic surgery with Dr. Dion Saucier in the past, he has had subacromial and glenohumeral injections that provided good relief, MRI did show recurrent cuff tearing and glenohumeral osteoarthritis. His last injection was in September so it is too soon to do it again. Adding Tylenol, he will continue this with his etodolac, and return to see me as needed. He is not interested in additional operative intervention.

## 2021-11-21 NOTE — Assessment & Plan Note (Signed)
Benjamin Warren does have lumbar DDD, he has responded well to L4-L5 interlaminar epidurals. Has not had an epidural for many years. Now having increasing pain left low back with radiation to the lateral hip mostly posterior, hip exam is normal, no pain with internal rotation or over the greater trochanter. Suspect recurrence of discogenic back pain, we will add advanced herniated disc conditioning, he will add arthritis strength Tylenol to his regimen and return to see me as needed.

## 2021-12-07 ENCOUNTER — Other Ambulatory Visit: Payer: Self-pay | Admitting: Sports Medicine

## 2021-12-07 DIAGNOSIS — I739 Peripheral vascular disease, unspecified: Secondary | ICD-10-CM

## 2021-12-07 DIAGNOSIS — R252 Cramp and spasm: Secondary | ICD-10-CM

## 2022-01-02 ENCOUNTER — Ambulatory Visit: Payer: 59 | Admitting: Sports Medicine

## 2022-01-26 ENCOUNTER — Other Ambulatory Visit: Payer: Self-pay | Admitting: Sports Medicine

## 2022-01-26 DIAGNOSIS — G729 Myopathy, unspecified: Secondary | ICD-10-CM

## 2022-01-30 ENCOUNTER — Encounter: Payer: Self-pay | Admitting: Family Medicine

## 2022-01-30 ENCOUNTER — Ambulatory Visit (INDEPENDENT_AMBULATORY_CARE_PROVIDER_SITE_OTHER): Payer: 59 | Admitting: Family Medicine

## 2022-01-30 VITALS — BP 134/68 | HR 59 | Ht 70.0 in | Wt 195.0 lb

## 2022-01-30 DIAGNOSIS — E559 Vitamin D deficiency, unspecified: Secondary | ICD-10-CM

## 2022-01-30 DIAGNOSIS — Z125 Encounter for screening for malignant neoplasm of prostate: Secondary | ICD-10-CM

## 2022-01-30 DIAGNOSIS — Z23 Encounter for immunization: Secondary | ICD-10-CM | POA: Diagnosis not present

## 2022-01-30 DIAGNOSIS — R413 Other amnesia: Secondary | ICD-10-CM

## 2022-01-30 DIAGNOSIS — Z0000 Encounter for general adult medical examination without abnormal findings: Secondary | ICD-10-CM | POA: Diagnosis not present

## 2022-01-30 DIAGNOSIS — Z13220 Encounter for screening for lipoid disorders: Secondary | ICD-10-CM | POA: Diagnosis not present

## 2022-01-30 NOTE — Progress Notes (Signed)
Benjamin Warren - 62 y.o. male MRN [id number]  Date of birth: [birth year]  Subjective Chief Complaint  Patient presents with   Annual Exam    HPI Benjamin Warren is a 62 y.o. male here today for annual exam.   Doing pretty well.  No new concerns today.   He has not been very active recently, typically this is better in the Spring and Summer. Diet has not been very good.   He is a non-smoker.  Denies EtOH use.   Immunizations: would like to have COVID vaccine.   He does see a dentist regularly.   Colon cancer screening is UTD.   Review of Systems  Constitutional:  Negative for chills, fever, malaise/fatigue and weight loss.  HENT:  Negative for congestion, ear pain and sore throat.   Eyes:  Negative for blurred vision, double vision and pain.  Respiratory:  Negative for cough and shortness of breath.   Cardiovascular:  Negative for chest pain and palpitations.  Gastrointestinal:  Negative for abdominal pain, blood in stool, constipation, heartburn and nausea.  Genitourinary:  Negative for dysuria and urgency.  Musculoskeletal:  Negative for joint pain and myalgias.  Neurological:  Negative for dizziness and headaches.  Endo/Heme/Allergies:  Does not bruise/bleed easily.  Psychiatric/Behavioral:  Negative for depression. The patient is not nervous/anxious and does not have insomnia.      No Known Allergies  Past Medical History:  Diagnosis Date   Anxiety 11/21/2016   Arthritis    Chronic pain syndrome    Crepitus of left TMJ on opening of jaw 11/21/2016   Depression    Fever blister 11/21/2016   Fibromyalgia    GERD (gastroesophageal reflux disease)    Left rotator cuff tear 10/09/2012   Male hypogonadism 11/21/2016   PONV (postoperative nausea and vomiting)    Sleep apnea    had test-no dr told him he needed cpap   TMJ syndrome    Vitamin D deficiency 11/21/2016    Past Surgical History:  Procedure Laterality Date   ELBOW ARTHROPLASTY  1996   right   [place]   rt wrist   ORIF WRIST FRACTURE  1997   right   ROTATOR CUFF REPAIR Right    January 2021   SHOULDER ARTHROSCOPY  1996   right   SHOULDER ARTHROSCOPY WITH ROTATOR CUFF REPAIR AND SUBACROMIAL DECOMPRESSION Left 10/09/2012   Procedure: LEFT SHOULDER ARTHROSCOPY WITH SUBACROMIAL DECOMPRESSION, DISTAL CLAVICLE EXCISION DEBRIDEMENT AND ROTATOR CUFF REPAIR;  Surgeon: Johnny Bridge, MD;  Location: [place];  Service: Orthopedics;  Laterality: Left;   VASECTOMY      Social History   Socioeconomic History   Marital status: Married    Spouse name: Not on file   Number of children: Not on file   Years of education: Not on file   Highest education level: Not on file  Occupational History   Not on file  Tobacco Use   Smoking status: Former    Packs/day: 0.50    Years: 6.00    Total pack years: 3.00    Types: Cigarettes    Quit date: 10/03/1982    Years since quitting: 39.3   Smokeless tobacco: Never  Vaping Use   Vaping Use: Never used  Substance and Sexual Activity   Alcohol use: Yes    Comment: occ   Drug use: Never   Sexual activity: Yes    Birth control/protection: Post-menopausal  Other Topics Concern   Not  on file  Social History Narrative   Not on file   Social Determinants of Health   Financial Resource Strain: Not on file  Food Insecurity: Not on file  Transportation Needs: Not on file  Physical Activity: Not on file  Stress: Not on file  Social Connections: Not on file    Family History  Problem Relation Age of Onset   Alcohol abuse Mother    Hypertension Mother    Alcohol abuse Other        FAMILY HISTORY   Arthritis Other        FAMILY HISTORY   Stroke Other    Pancreatic cancer Brother    Liver cancer Brother     Health Maintenance  Topic Date Due   COLONOSCOPY (Pts 45-44yrs Insurance coverage will need to be confirmed)  10/15/2023   DTaP/Tdap/Td (4 - Td or Tdap) 06/23/2026   INFLUENZA VACCINE  Completed    COVID-19 Vaccine  Completed   Hepatitis C Screening  Completed   HIV Screening  Completed   Zoster Vaccines- Shingrix  Completed   HPV VACCINES  Aged Out     ----------------------------------------------------------------------------------------------------------------------------------------------------------------------------------------------------------------- Physical Exam BP 134/68 (BP Location: Left Arm, Patient Position: Sitting, Cuff Size: Large)   Pulse (!) 59   Ht 5\' 10"  (1.778 m)   Wt 195 lb (88.5 kg)   SpO2 97%   BMI 27.98 kg/m   Physical Exam Constitutional:      General: He is not in acute distress. HENT:     Head: Normocephalic and atraumatic.     Right Ear: Tympanic membrane and external ear normal.     Left Ear: Tympanic membrane and external ear normal.  Eyes:     General: No scleral icterus. Neck:     Thyroid: No thyromegaly.  Cardiovascular:     Rate and Rhythm: Normal rate and regular rhythm.     Heart sounds: Normal heart sounds.  Pulmonary:     Effort: Pulmonary effort is normal.     Breath sounds: Normal breath sounds.  Abdominal:     General: Bowel sounds are normal. There is no distension.     Palpations: Abdomen is soft.     Tenderness: There is no abdominal tenderness. There is no guarding.  Musculoskeletal:     Cervical back: Normal range of motion.  Lymphadenopathy:     Cervical: No cervical adenopathy.  Skin:    General: Skin is warm and dry.     Findings: No rash.  Neurological:     Mental Status: He is alert and oriented to person, place, and time.     Cranial Nerves: No cranial nerve deficit.     Motor: No abnormal muscle tone.  Psychiatric:        Mood and Affect: Mood normal.        Behavior: Behavior normal.      ------------------------------------------------------------------------------------------------------------------------------------------------------------------------------------------------------------------- Assessment and Plan  Well adult exam Well adult Orders Placed This Encounter  Procedures   [place] Fall 2023 Covid-19 Vaccine 108yrs and older   COMPLETE METABOLIC PANEL WITH GFR   CBC with Differential   PSA   Lipid Panel w/reflex Direct LDL   TSH   Vitamin D (25 hydroxy)  Screenings: per lab orders Immunizations: COVID vaccine given Anticipatory guidance/Risk factor reduction:  Recommendations per AVS.    No orders of the defined types were placed in this encounter.   Return in about 6 months (around 07/31/2022) for Lipid/PAD.    This visit occurred during the  SARS-CoV-2 public health emergency.  Safety protocols were in place, including screening questions prior to the visit, additional usage of staff PPE, and extensive cleaning of exam room while observing appropriate contact time as indicated for disinfecting solutions.

## 2022-01-30 NOTE — Assessment & Plan Note (Signed)
Well adult Orders Placed This Encounter  Procedures   [facility] Fall 2023 Covid-19 Vaccine 27yrs and older   COMPLETE METABOLIC PANEL WITH GFR   CBC with Differential   PSA   Lipid Panel w/reflex Direct LDL   TSH   Vitamin D (25 hydroxy)  Screenings: per lab orders Immunizations: COVID vaccine given Anticipatory guidance/Risk factor reduction:  Recommendations per AVS.

## 2022-01-30 NOTE — Patient Instructions (Signed)
Preventive Care 50-60 Years Old, Male Preventive care refers to lifestyle choices and visits with your health care provider that can promote health and wellness. Preventive care visits are also called wellness exams. What can I expect for my preventive care visit? Counseling During your preventive care visit, your health care provider may ask about your: Medical history, including: Past medical problems. Family medical history. Current health, including: Emotional well-being. Home life and relationship well-being. Sexual activity. Lifestyle, including: Alcohol, nicotine or tobacco, and drug use. Access to firearms. Diet, exercise, and sleep habits. Safety issues such as seatbelt and bike helmet use. Sunscreen use. Work and work Statistician. Physical exam Your health care provider will check your: Height and weight. These may be used to calculate your BMI (body mass index). BMI is a measurement that tells if you are at a healthy weight. Waist circumference. This measures the distance around your waistline. This measurement also tells if you are at a healthy weight and may help predict your risk of certain diseases, such as type 2 diabetes and high blood pressure. Heart rate and blood pressure. Body temperature. Skin for abnormal spots. What immunizations do I need?  Vaccines are usually given at various ages, according to a schedule. Your health care provider will recommend vaccines for you based on your age, medical history, and lifestyle or other factors, such as travel or where you work. What tests do I need? Screening Your health care provider may recommend screening tests for certain conditions. This may include: Lipid and cholesterol levels. Diabetes screening. This is done by checking your blood sugar (glucose) after you have not eaten for a while (fasting). Hepatitis B test. Hepatitis C test. HIV (human immunodeficiency virus) test. STI (sexually transmitted infection)  testing, if you are at risk. Lung cancer screening. Prostate cancer screening. Colorectal cancer screening. Talk with your health care provider about your test results, treatment options, and if necessary, the need for more tests. Follow these instructions at home: Eating and drinking  Eat a diet that includes fresh fruits and vegetables, whole grains, lean protein, and low-fat dairy products. Take vitamin and mineral supplements as recommended by your health care provider. Do not drink alcohol if your health care provider tells you not to drink. If you drink alcohol: Limit how much you have to 0-2 drinks a day. Know how much alcohol is in your drink. In the [place] one drink equals one 12 oz bottle of beer (355 mL), one 5 oz glass of wine (148 mL), or one 1 oz glass of hard liquor (44 mL). Lifestyle Brush your teeth every morning and night with fluoride toothpaste. Floss one time each day. Exercise for at least 30 minutes 5 or more days each week. Do not use any products that contain nicotine or tobacco. These products include cigarettes, chewing tobacco, and vaping devices, such as e-cigarettes. If you need help quitting, ask your health care provider. Do not use drugs. If you are sexually active, practice safe sex. Use a condom or other form of protection to prevent STIs. Take aspirin only as told by your health care provider. Make sure that you understand how much to take and what form to take. Work with your health care provider to find out whether it is safe and beneficial for you to take aspirin daily. Find healthy ways to manage stress, such as: Meditation, yoga, or listening to music. Journaling. Talking to a trusted person. Spending time with friends and family. Minimize exposure to UV radiation to reduce  your risk of skin cancer. ?Safety ?Always wear your seat belt while driving or riding in a vehicle. ?Do not drive: ?If you have been drinking alcohol. Do not ride with someone who  has been drinking. ?When you are tired or distracted. ?While texting. ?If you have been using any mind-altering substances or drugs. ?Wear a helmet and other protective equipment during sports activities. ?If you have firearms in your house, make sure you follow all gun safety procedures. ?What's next? ?Go to your health care provider once a year for an annual wellness visit. ?Ask your health care provider how often you should have your eyes and teeth checked. ?Stay up to date on all vaccines. ?This information is not intended to replace advice given to you by your health care provider. Make sure you discuss any questions you have with your health care provider. ?Document Revised: 06/28/2020 Document Reviewed: 06/28/2020 ?Elsevier Patient Education ? 2023 Elsevier Inc. ? ?

## 2022-01-31 LAB — COMPLETE METABOLIC PANEL WITH GFR
AG Ratio: 1.8 (calc) (ref 1.0–2.5)
ALT: 18 U/L (ref 9–46)
AST: 19 U/L (ref 10–35)
Albumin: 4.4 g/dL (ref 3.6–5.1)
Alkaline phosphatase (APISO): 72 U/L (ref 35–144)
BUN: 21 mg/dL (ref 7–25)
CO2: 23 mmol/L (ref 20–32)
Calcium: 9.5 mg/dL (ref 8.6–10.3)
Chloride: 108 mmol/L (ref 98–110)
Creat: 1.15 mg/dL (ref 0.70–1.30)
Globulin: 2.5 g/dL (calc) (ref 1.9–3.7)
Glucose, Bld: 88 mg/dL (ref 65–99)
Potassium: 4.5 mmol/L (ref 3.5–5.3)
Sodium: 143 mmol/L (ref 135–146)
Total Bilirubin: 0.5 mg/dL (ref 0.2–1.2)
Total Protein: 6.9 g/dL (ref 6.1–8.1)
eGFR: 73 mL/min/{1.73_m2} (ref >=60)

## 2022-01-31 LAB — VITAMIN D 25 HYDROXY (VIT D DEFICIENCY, FRACTURES): Vit D, 25-Hydroxy: 35 ng/mL (ref 30–100)

## 2022-01-31 LAB — CBC WITH DIFFERENTIAL/PLATELET
Absolute Monocytes: 704 cells/uL (ref 200–950)
Basophils Absolute: 62 cells/uL (ref 0–200)
Basophils Relative: 0.7 %
Eosinophils Absolute: 141 cells/uL (ref 15–500)
Eosinophils Relative: 1.6 %
HCT: 47.8 % (ref 38.5–50.0)
Hemoglobin: 16.2 g/dL (ref 13.2–17.1)
Lymphs Abs: 2420 cells/uL (ref 850–3900)
MCH: 31.5 pg (ref 27.0–33.0)
MCHC: 33.9 g/dL (ref 32.0–36.0)
MCV: 92.8 fL (ref 80.0–100.0)
MPV: 10 fL (ref 7.5–12.5)
Monocytes Relative: 8 %
Neutro Abs: 5474 cells/uL (ref 1500–7800)
Neutrophils Relative %: 62.2 %
Platelets: 282 10*3/uL (ref 140–400)
RBC: 5.15 10*6/uL (ref 4.20–5.80)
RDW: 12.6 % (ref 11.0–15.0)
Total Lymphocyte: 27.5 %
WBC: 8.8 10*3/uL (ref 3.8–10.8)

## 2022-01-31 LAB — LIPID PANEL W/REFLEX DIRECT LDL
Cholesterol: 141 mg/dL (ref <200)
HDL: 47 mg/dL (ref >=40)
LDL Cholesterol (Calc): 76 mg/dL (calc)
Non-HDL Cholesterol (Calc): 94 mg/dL (calc) (ref <130)
Total CHOL/HDL Ratio: 3 (calc) (ref <5.0)
Triglycerides: 97 mg/dL (ref <150)

## 2022-01-31 LAB — PSA: PSA: 1.74 ng/mL (ref <=4.00)

## 2022-01-31 LAB — TSH: TSH: 2.36 mIU/L (ref 0.40–4.50)

## 2022-02-07 ENCOUNTER — Telehealth: Payer: Self-pay

## 2022-02-07 DIAGNOSIS — G729 Myopathy, unspecified: Secondary | ICD-10-CM

## 2022-02-07 MED ORDER — ETODOLAC ER 600 MG PO TB24: 600.0000 mg | ORAL_TABLET | Freq: Two times a day (BID) | ORAL | 3 refills | Status: DC

## 2022-02-07 NOTE — Telephone Encounter (Signed)
Patient needs refill on etodolac please advise.

## 2022-02-07 NOTE — Addendum Note (Signed)
Addended by: Silverio Decamp on: 02/07/2022 03:25 PM   Modules accepted: Orders

## 2022-02-07 NOTE — Telephone Encounter (Signed)
Done

## 2022-02-12 ENCOUNTER — Other Ambulatory Visit: Payer: Self-pay | Admitting: Family Medicine

## 2022-02-12 DIAGNOSIS — K219 Gastro-esophageal reflux disease without esophagitis: Secondary | ICD-10-CM

## 2022-03-08 ENCOUNTER — Other Ambulatory Visit: Payer: Self-pay | Admitting: Family Medicine

## 2022-03-08 DIAGNOSIS — R252 Cramp and spasm: Secondary | ICD-10-CM

## 2022-03-08 DIAGNOSIS — I739 Peripheral vascular disease, unspecified: Secondary | ICD-10-CM

## 2022-03-12 ENCOUNTER — Other Ambulatory Visit: Payer: Self-pay | Admitting: Neurosurgery

## 2022-03-12 DIAGNOSIS — Z1231 Encounter for screening mammogram for malignant neoplasm of breast: Secondary | ICD-10-CM

## 2022-03-16 ENCOUNTER — Other Ambulatory Visit: Payer: Self-pay | Admitting: Neurosurgery

## 2022-03-16 DIAGNOSIS — M4316 Spondylolisthesis, lumbar region: Secondary | ICD-10-CM

## 2022-03-18 ENCOUNTER — Ambulatory Visit (INDEPENDENT_AMBULATORY_CARE_PROVIDER_SITE_OTHER): Payer: 59

## 2022-03-18 DIAGNOSIS — M4316 Spondylolisthesis, lumbar region: Secondary | ICD-10-CM | POA: Diagnosis not present

## 2022-03-18 MED ORDER — GADOBUTROL 1 MMOL/ML IV SOLN
10.0000 mL | Freq: Once | INTRAVENOUS | Status: AC | PRN
  Administered 2022-03-18: 10 mL via INTRAVENOUS

## 2022-04-16 ENCOUNTER — Ambulatory Visit: Payer: 59 | Attending: Neurosurgery | Admitting: Physical Therapy

## 2022-04-16 ENCOUNTER — Encounter: Payer: Self-pay | Admitting: Physical Therapy

## 2022-04-16 ENCOUNTER — Other Ambulatory Visit: Payer: Self-pay

## 2022-04-16 DIAGNOSIS — M5442 Lumbago with sciatica, left side: Secondary | ICD-10-CM | POA: Diagnosis not present

## 2022-04-16 DIAGNOSIS — G8929 Other chronic pain: Secondary | ICD-10-CM | POA: Diagnosis present

## 2022-04-16 DIAGNOSIS — R293 Abnormal posture: Secondary | ICD-10-CM

## 2022-04-16 DIAGNOSIS — R29898 Other symptoms and signs involving the musculoskeletal system: Secondary | ICD-10-CM | POA: Diagnosis present

## 2022-04-16 DIAGNOSIS — M5441 Lumbago with sciatica, right side: Secondary | ICD-10-CM | POA: Insufficient documentation

## 2022-04-16 DIAGNOSIS — M6281 Muscle weakness (generalized): Secondary | ICD-10-CM

## 2022-04-16 NOTE — Therapy (Signed)
OUTPATIENT PHYSICAL THERAPY THORACOLUMBAR EVALUATION   Patient Name: Benjamin Warren MRN: [id number] DOB:1960-11-17, 62 y.o., male Today's Date: 04/16/2022  END OF SESSION:  PT End of Session - 04/16/22 1303     Visit Number 1    Number of Visits 12    Date for PT Re-Evaluation 05/28/22    Authorization Type [employer]    PT Start Time 1100    PT Stop Time [id number]    PT Time Calculation (min) 35 min    Activity Tolerance Patient tolerated treatment well    Behavior During Therapy [facility] for tasks assessed/performed             Past Medical History:  Diagnosis Date   Anxiety 11/21/2016   Arthritis    Chronic pain syndrome    Crepitus of left TMJ on opening of jaw 11/21/2016   Depression    Fever blister 11/21/2016   Fibromyalgia    GERD (gastroesophageal reflux disease)    Left rotator cuff tear 10/09/2012   Male hypogonadism 11/21/2016   PONV (postoperative nausea and vomiting)    Sleep apnea    had test-no dr told him he needed cpap   TMJ syndrome    Vitamin D deficiency 11/21/2016   Past Surgical History:  Procedure Laterality Date   ELBOW ARTHROPLASTY  1996   right   [place]   rt wrist   ORIF WRIST FRACTURE  1997   right   ROTATOR CUFF REPAIR Right    January 2021   SHOULDER ARTHROSCOPY  1996   right   SHOULDER ARTHROSCOPY WITH ROTATOR CUFF REPAIR AND SUBACROMIAL DECOMPRESSION Left 10/09/2012   Procedure: LEFT SHOULDER ARTHROSCOPY WITH SUBACROMIAL DECOMPRESSION, DISTAL CLAVICLE EXCISION DEBRIDEMENT AND ROTATOR CUFF REPAIR;  Surgeon: Johnny Bridge, MD;  Location: [place];  Service: Orthopedics;  Laterality: Left;   VASECTOMY     Patient Active Problem List   Diagnosis Date Noted   Spondylolisthesis, lumbar region 11/09/2020   Avulsion fracture of ankle 08/23/2020   Impingement syndrome, shoulder, left 06/05/2020   Well adult exam 01/03/2020   Peripheral arterial disease 08/03/2019   Impingement syndrome, shoulder, right  01/12/2019   Lumbar spinal stenosis 06/29/2018   Trochanteric bursitis, right hip 07/08/2017   Primary osteoarthritis of first carpometacarpal joint of left hand 03/18/2017   Chronic headaches 11/21/2016   Anxiety 11/21/2016   Male hypogonadism 11/21/2016   Vitamin D deficiency 11/21/2016   Tremor 10/16/2016   Insomnia 09/27/2016   Memory loss 09/27/2016   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 07/08/2016   Routine general medical examination at a health care facility 11/26/2014   Primary osteoarthritis of both hands 11/25/2014   Dizziness 12/23/2013   Allergic rhinitis 12/23/2013   Fibromyalgia    Chronic pain syndrome    Depression    GERD (gastroesophageal reflux disease)    OSA (obstructive sleep apnea)    Osteoarthritis 08/10/2012   Polyarthralgia 10/03/2010    PCP: Luetta Nutting  REFERRING PROVIDER: Frederich Cha  REFERRING DIAG: lumbar spinal stenosis, herniation of cervical disc  Rationale for Evaluation and Treatment: Rehabilitation  THERAPY DIAG:  Chronic bilateral low back pain with bilateral sciatica  Muscle weakness (generalized)  Abnormal posture  ONSET DATE: 03/2022  SUBJECTIVE:  SUBJECTIVE STATEMENT: Pt has been having back pain for the past 4-5 years. He had L4-5 fusion about 2 years ago. He can feel pain across his back and into hips. Pain increases with prolonged standing (5 minutes) and sitting, coughing. Pain eases with walking initially, but pain increases with prolonged walking  PERTINENT HISTORY:  L4-5 fusion 2022, arthritis  PAIN:  Are you having pain? Yes: NPRS scale: 3/10 currently, [id number] with certain [id number] Pain location: low back both sides Pain description: grabbing, sore Aggravating factors: prolonged walk, stand,  sit Relieving factors: change positions  PRECAUTIONS: None  WEIGHT BEARING RESTRICTIONS: No  FALLS:  Has patient fallen in last 6 months? No  OCCUPATION: Disability due to back issues, enjoys playing bass guitar  PLOF: Independent  PATIENT GOALS: reduce pain, be able to return to hour long walks without pain   OBJECTIVE:   DIAGNOSTIC FINDINGS:  MRI: Multilevel degenerative changes of the lumbar spine, worst at L2-L3 and L3-L4.  PATIENT SURVEYS:  FOTO 47   MUSCLE LENGTH: Hamstrings: Right 70 deg; Left 75 deg Thomas test: positive bilat  POSTURE: rounded shoulders and forward head  PALPATION: TTP Spinous processes of L2-L5, lumbar paraspinals with increased mm spasticity L4-S1  LUMBAR ROM:   AROM eval  Flexion WFL  Extension Limited 50%  Right lateral flexion Limited 25% pain  Left lateral flexion Limited 25% pain  Right rotation WFL  Left rotation WFL   (Blank rows = not tested)  LOWER EXTREMITY ROM:     Active  Right eval Left eval  Hip flexion    Hip extension    Hip abduction    Hip adduction    Hip internal rotation 5 To neutral  Hip external rotation    Knee flexion    Knee extension    Ankle dorsiflexion    Ankle plantarflexion    Ankle inversion    Ankle eversion     (Blank rows = not tested)  LOWER EXTREMITY MMT:    MMT Right eval Left eval  Hip flexion 4+ 4+  Hip extension 4- 4-  Hip abduction 4 4  Hip adduction    Hip internal rotation    Hip external rotation    Knee flexion    Knee extension    Ankle dorsiflexion    Ankle plantarflexion    Ankle inversion    Ankle eversion     (Blank rows = not tested)  SLR Negative bilat  TODAY'S TREATMENT:                                                                                                                              DATE: 04/16/22 See HEP    PATIENT EDUCATION:  Education details: PT POC and goals, HEP Person educated: Patient Education method: Explanation,  Demonstration, and Handouts Education comprehension: verbalized understanding and returned demonstration  HOME EXERCISE PROGRAM: Access Code: [email or web address] URL: [email or web address] Date: 04/16/2022 Prepared by: Isabelle Course  Exercises -  Supine Lower Trunk Rotation  - 1 x daily - 7 x weekly - 1 sets - 10 reps - 10 seconds hold - Seated Pelvic Tilt  - 1 x daily - 7 x weekly - 3 sets - 10 reps - Side Stepping with Resistance at Ankles  - 1 x daily - 7 x weekly - 3 sets - 10 reps - Squat with Chair Touch  - 1 x daily - 7 x weekly - 3 sets - 10 reps  ASSESSMENT:  CLINICAL IMPRESSION: Patient is a 62 y.o. male who was seen today for physical therapy evaluation and treatment for lumbar spinal stenosis and cervical disc herniation. Pt with primary c/o back pain that travels to bilat hips. Pt presents with decreased core and LE strength, decreased activity tolerance, decreased ROM and flexibility and increased pain. Pt will benefit from skilled PT to address deficits and improve functional mobility with decreased pain.   OBJECTIVE IMPAIRMENTS: decreased activity tolerance, decreased mobility, difficulty walking, decreased strength, increased muscle spasms, impaired flexibility, and pain.   ACTIVITY LIMITATIONS: sitting, standing, and bed mobility  PARTICIPATION LIMITATIONS: community activity  PERSONAL FACTORS: Past/current experiences and Time since onset of injury/illness/exacerbation are also affecting patient's functional outcome.   REHAB POTENTIAL: Good  CLINICAL DECISION MAKING: Evolving/moderate complexity  EVALUATION COMPLEXITY: Moderate   GOALS: Goals reviewed with patient? Yes  SHORT TERM GOALS: Target date: 04/30/2022    Pt will be independent in initial HEP Baseline: Goal status: INITIAL  LONG TERM GOALS: Target date: 05/28/2022    Pt will be independent with advanced HEP Baseline:  Goal status: INITIAL  2.  Pt will improve FOTO to >= 57 to demo  improved functional mobility Baseline:  Goal status: INITIAL  3.  Pt will tolerate walking x 1 hour with pain <= 2/10 to return to prior fitness routine Baseline:  Goal status: INITIAL  4.  Pt will improve bilat LE strength to 4+/5 to improve standing tolerance Baseline:  Goal status: INITIAL   PLAN:  PT FREQUENCY: 2x/week  PT DURATION: 6 weeks  PLANNED INTERVENTIONS: Therapeutic exercises, Therapeutic activity, Neuromuscular re-education, Balance training, Gait training, Patient/Family education, Self Care, Joint mobilization, Aquatic Therapy, Dry Needling, Electrical stimulation, Cryotherapy, Moist heat, Taping, Ultrasound, Ionotophoresis 4mg /ml Dexamethasone, Manual therapy, and Re-evaluation.  PLAN FOR NEXT SESSION: Assess response to HEP, core and hip strength, lumbar mobility, flexibility   Liban Guedes, PT 04/16/2022, 1:04 PM

## 2022-04-19 ENCOUNTER — Ambulatory Visit: Payer: 59 | Admitting: Physical Therapy

## 2022-04-23 ENCOUNTER — Encounter: Payer: Self-pay | Admitting: Physical Therapy

## 2022-04-23 ENCOUNTER — Ambulatory Visit: Payer: 59 | Admitting: Physical Therapy

## 2022-04-23 DIAGNOSIS — M5442 Lumbago with sciatica, left side: Secondary | ICD-10-CM | POA: Diagnosis not present

## 2022-04-23 DIAGNOSIS — G8929 Other chronic pain: Secondary | ICD-10-CM

## 2022-04-23 DIAGNOSIS — M6281 Muscle weakness (generalized): Secondary | ICD-10-CM

## 2022-04-23 DIAGNOSIS — R29898 Other symptoms and signs involving the musculoskeletal system: Secondary | ICD-10-CM

## 2022-04-23 DIAGNOSIS — R293 Abnormal posture: Secondary | ICD-10-CM

## 2022-04-23 NOTE — Therapy (Signed)
OUTPATIENT PHYSICAL THERAPY THORACOLUMBAR EVALUATION   Patient Name: Benjamin Warren MRN: [id number] DOB:1960-02-13, 62 y.o., male Today's Date: 04/23/2022  END OF SESSION:  PT End of Session - 04/23/22 1223     Visit Number 2    Number of Visits 12    Date for PT Re-Evaluation 05/28/22    PT Start Time 1142    PT Stop Time 1225    PT Time Calculation (min) 43 min    Activity Tolerance Patient tolerated treatment well    Behavior During Therapy [facility] for tasks assessed/performed              Past Medical History:  Diagnosis Date   Anxiety 11/21/2016   Arthritis    Chronic pain syndrome    Crepitus of left TMJ on opening of jaw 11/21/2016   Depression    Fever blister 11/21/2016   Fibromyalgia    GERD (gastroesophageal reflux disease)    Left rotator cuff tear 10/09/2012   Male hypogonadism 11/21/2016   PONV (postoperative nausea and vomiting)    Sleep apnea    had test-no dr told him he needed cpap   TMJ syndrome    Vitamin D deficiency 11/21/2016   Past Surgical History:  Procedure Laterality Date   ELBOW ARTHROPLASTY  1996   right   HARDWARE REMOVAL  1997   rt wrist   ORIF WRIST FRACTURE  1997   right   ROTATOR CUFF REPAIR Right    January 2021   SHOULDER ARTHROSCOPY  1996   right   SHOULDER ARTHROSCOPY WITH ROTATOR CUFF REPAIR AND SUBACROMIAL DECOMPRESSION Left 10/09/2012   Procedure: LEFT SHOULDER ARTHROSCOPY WITH SUBACROMIAL DECOMPRESSION, DISTAL CLAVICLE EXCISION DEBRIDEMENT AND ROTATOR CUFF REPAIR;  Surgeon: Eulas Post, MD;  Location: [facility];  Service: Orthopedics;  Laterality: Left;   VASECTOMY     Patient Active Problem List   Diagnosis Date Noted   Spondylolisthesis, lumbar region 11/09/2020   Avulsion fracture of ankle 08/23/2020   Impingement syndrome, shoulder, left 06/05/2020   Well adult exam 01/03/2020   Peripheral arterial disease 08/03/2019   Impingement syndrome, shoulder, right 01/12/2019   Lumbar spinal  stenosis 06/29/2018   Trochanteric bursitis, right hip 07/08/2017   Primary osteoarthritis of first carpometacarpal joint of left hand 03/18/2017   Chronic headaches 11/21/2016   Anxiety 11/21/2016   Male hypogonadism 11/21/2016   Vitamin D deficiency 11/21/2016   Tremor 10/16/2016   Insomnia 09/27/2016   Memory loss 09/27/2016   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 07/08/2016   Routine general medical examination at a health care facility 11/26/2014   Primary osteoarthritis of both hands 11/25/2014   Dizziness 12/23/2013   Allergic rhinitis 12/23/2013   Fibromyalgia    Chronic pain syndrome    Depression    GERD (gastroesophageal reflux disease)    OSA (obstructive sleep apnea)    Osteoarthritis 08/10/2012   Polyarthralgia 10/03/2010    PCP: Everrett Coombe  REFERRING PROVIDER: Peggye Ley  REFERRING DIAG: lumbar spinal stenosis, herniation of cervical disc  Rationale for Evaluation and Treatment: Rehabilitation  THERAPY DIAG:  Chronic bilateral low back pain with bilateral sciatica  Muscle weakness (generalized)  Abnormal posture  Other symptoms and signs involving the musculoskeletal system  ONSET DATE: 03/2022  SUBJECTIVE:  SUBJECTIVE STATEMENT: Pt states he has been having pain after the exercises which resolves after rest. He states he still has pain with "certain movements"  Pt has been having back pain for the past 4-5 years. He had L4-5 fusion about 2 years ago. He can feel pain across his back and into hips. Pain increases with prolonged standing (5 minutes) and sitting, coughing. Pain eases with walking initially, but pain increases with prolonged walking  PERTINENT HISTORY:  L4-5 fusion 2022, arthritis  PAIN:  Are you having pain? Yes:  NPRS scale: 3/10 currently, 8/10 with certain movements/10 Pain location: low back both sides Pain description: grabbing, sore Aggravating factors: prolonged walk, stand, sit Relieving factors: change positions  PRECAUTIONS: None  WEIGHT BEARING RESTRICTIONS: No  FALLS:  Has patient fallen in last 6 months? No  OCCUPATION: Disability due to back issues, enjoys playing bass guitar  PLOF: Independent  PATIENT GOALS: reduce pain, be able to return to hour long walks without pain   OBJECTIVE:   DIAGNOSTIC FINDINGS:  MRI: Multilevel degenerative changes of the lumbar spine, worst at L2-L3 and L3-L4.   LUMBAR ROM:   AROM eval  Flexion WFL  Extension Limited 50%  Right lateral flexion Limited 25% pain  Left lateral flexion Limited 25% pain  Right rotation WFL  Left rotation WFL   (Blank rows = not tested)  LOWER EXTREMITY ROM:     Active  Right eval Left eval  Hip flexion    Hip extension    Hip abduction    Hip adduction    Hip internal rotation 5 To neutral  Hip external rotation    Knee flexion    Knee extension    Ankle dorsiflexion    Ankle plantarflexion    Ankle inversion    Ankle eversion     (Blank rows = not tested)  LOWER EXTREMITY MMT:    MMT Right eval Left eval  Hip flexion 4+ 4+  Hip extension 4- 4-  Hip abduction 4 4  Hip adduction    Hip internal rotation    Hip external rotation    Knee flexion    Knee extension    Ankle dorsiflexion    Ankle plantarflexion    Ankle inversion    Ankle eversion     (Blank rows = not tested)  SLR Negative bilat  TODAY'S TREATMENT:                                                                                                                              [facility] Adult PT Treatment:                                                DATE: 04/23/22 Therapeutic Exercise: Nustep L6 x 5 min for warm up Pallof 10# x 10 bilat Row  15# x 20 Shoulder ext 15 # x 20 Resisted walking backward 15# x 10 Resisted  walking laterally 10# x 8 bilat Supine sciatic nerve glide x 10 bilat SKTC x 30 sec bilat Piriformis stretch 2 x 30 sec bilat LTR x 2 min in pain free range    PATIENT EDUCATION:  Education details: PT POC and goals, HEP Person educated: Patient Education method: Explanation, Demonstration, and Handouts Education comprehension: verbalized understanding and returned demonstration  HOME EXERCISE PROGRAM: Access Code: [email or web address] URL: [email or web address] Date: 04/23/2022 Prepared by: Reggy Eye  Exercises - Supine Lower Trunk Rotation  - 1 x daily - 7 x weekly - 1 sets - 10 reps - 10 seconds hold - Seated Pelvic Tilt  - 1 x daily - 7 x weekly - 3 sets - 10 reps - Side Stepping with Resistance at Ankles  - 1 x daily - 7 x weekly - 3 sets - 10 reps - Squat with Chair Touch  - 1 x daily - 7 x weekly - 3 sets - 10 reps - Supine 90/90 Sciatic Nerve Glide with Knee Flexion/Extension  - 1 x daily - 7 x weekly - 1 sets - 10 reps  ASSESSMENT:  CLINICAL IMPRESSION: Session focused on improving hip and core strength without increasing pain. Pt with good tolerance to updated exercises. Added supine sciatic nerve glide to HEP    GOALS: Goals reviewed with patient? Yes  SHORT TERM GOALS: Target date: 04/30/2022    Pt will be independent in initial HEP Baseline: Goal status: INITIAL  LONG TERM GOALS: Target date: 05/28/2022    Pt will be independent with advanced HEP Baseline:  Goal status: INITIAL  2.  Pt will improve FOTO to >= 57 to demo improved functional mobility Baseline:  Goal status: INITIAL  3.  Pt will tolerate walking x 1 hour with pain <= 2/10 to return to prior fitness routine Baseline:  Goal status: INITIAL  4.  Pt will improve bilat LE strength to 4+/5 to improve standing tolerance Baseline:  Goal status: INITIAL   PLAN:  PT FREQUENCY: 2x/week  PT DURATION: 6 weeks  PLANNED INTERVENTIONS: Therapeutic exercises, Therapeutic  activity, Neuromuscular re-education, Balance training, Gait training, Patient/Family education, Self Care, Joint mobilization, Aquatic Therapy, Dry Needling, Electrical stimulation, Cryotherapy, Moist heat, Taping, Ultrasound, Ionotophoresis 4mg /ml Dexamethasone, Manual therapy, and Re-evaluation.  PLAN FOR NEXT SESSION: core and hip strength, lumbar mobility, flexibility   Eulala Newcombe, PT 04/23/2022, 12:24 PM

## 2022-04-25 ENCOUNTER — Ambulatory Visit: Payer: 59

## 2022-04-30 ENCOUNTER — Ambulatory Visit: Payer: 59 | Admitting: Physical Therapy

## 2022-04-30 ENCOUNTER — Encounter: Payer: Self-pay | Admitting: Physical Therapy

## 2022-04-30 DIAGNOSIS — M5442 Lumbago with sciatica, left side: Secondary | ICD-10-CM | POA: Diagnosis not present

## 2022-04-30 DIAGNOSIS — R293 Abnormal posture: Secondary | ICD-10-CM

## 2022-04-30 DIAGNOSIS — M6281 Muscle weakness (generalized): Secondary | ICD-10-CM

## 2022-04-30 DIAGNOSIS — R29898 Other symptoms and signs involving the musculoskeletal system: Secondary | ICD-10-CM

## 2022-04-30 DIAGNOSIS — G8929 Other chronic pain: Secondary | ICD-10-CM

## 2022-04-30 NOTE — Therapy (Addendum)
OUTPATIENT PHYSICAL THERAPY THORACOLUMBAR TREATMENT AND DISCHARGE   Patient Name: Benjamin Warren MRN: [id number] DOB:03-02-1960, 62 y.o., male Today's Date: 04/30/2022  END OF SESSION:  PT End of Session - 04/30/22 1222     Visit Number 3    Number of Visits 12    Date for PT Re-Evaluation 05/28/22    PT Start Time 1145    PT Stop Time 1225    PT Time Calculation (min) 40 min    Activity Tolerance Patient tolerated treatment well    Behavior During Therapy [facility] for tasks assessed/performed               Past Medical History:  Diagnosis Date   Anxiety 11/21/2016   Arthritis    Chronic pain syndrome    Crepitus of left TMJ on opening of jaw 11/21/2016   Depression    Fever blister 11/21/2016   Fibromyalgia    GERD (gastroesophageal reflux disease)    Left rotator cuff tear 10/09/2012   Male hypogonadism 11/21/2016   PONV (postoperative nausea and vomiting)    Sleep apnea    had test-no dr told him he needed cpap   TMJ syndrome    Vitamin D deficiency 11/21/2016   Past Surgical History:  Procedure Laterality Date   ELBOW ARTHROPLASTY  1996   right   HARDWARE REMOVAL  1997   rt wrist   ORIF WRIST FRACTURE  1997   right   ROTATOR CUFF REPAIR Right    January 2021   SHOULDER ARTHROSCOPY  1996   right   SHOULDER ARTHROSCOPY WITH ROTATOR CUFF REPAIR AND SUBACROMIAL DECOMPRESSION Left 10/09/2012   Procedure: LEFT SHOULDER ARTHROSCOPY WITH SUBACROMIAL DECOMPRESSION, DISTAL CLAVICLE EXCISION DEBRIDEMENT AND ROTATOR CUFF REPAIR;  Surgeon: Eulas Post, MD;  Location: [facility];  Service: Orthopedics;  Laterality: Left;   VASECTOMY     Patient Active Problem List   Diagnosis Date Noted   Spondylolisthesis, lumbar region 11/09/2020   Avulsion fracture of ankle 08/23/2020   Impingement syndrome, shoulder, left 06/05/2020   Well adult exam 01/03/2020   Peripheral arterial disease 08/03/2019   Impingement syndrome, shoulder, right 01/12/2019    Lumbar spinal stenosis 06/29/2018   Trochanteric bursitis, right hip 07/08/2017   Primary osteoarthritis of first carpometacarpal joint of left hand 03/18/2017   Chronic headaches 11/21/2016   Anxiety 11/21/2016   Male hypogonadism 11/21/2016   Vitamin D deficiency 11/21/2016   Tremor 10/16/2016   Insomnia 09/27/2016   Memory loss 09/27/2016   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 07/08/2016   Routine general medical examination at a health care facility 11/26/2014   Primary osteoarthritis of both hands 11/25/2014   Dizziness 12/23/2013   Allergic rhinitis 12/23/2013   Fibromyalgia    Chronic pain syndrome    Depression    GERD (gastroesophageal reflux disease)    OSA (obstructive sleep apnea)    Osteoarthritis 08/10/2012   Polyarthralgia 10/03/2010    PCP: Everrett Coombe  REFERRING PROVIDER: Peggye Ley  REFERRING DIAG: lumbar spinal stenosis, herniation of cervical disc  Rationale for Evaluation and Treatment: Rehabilitation  THERAPY DIAG:  Chronic bilateral low back pain with bilateral sciatica  Muscle weakness (generalized)  Abnormal posture  Other symptoms and signs involving the musculoskeletal system  ONSET DATE: 03/2022  SUBJECTIVE:  SUBJECTIVE STATEMENT: Pt states he continues to have pain after exercises despite changing HEP last visit but also states that he feels better after "moving around". Pt states he wants to return to MD before continuing therapy  Pt has been having back pain for the past 4-5 years. He had L4-5 fusion about 2 years ago. He can feel pain across his back and into hips. Pain increases with prolonged standing (5 minutes) and sitting, coughing. Pain eases with walking initially, but pain increases with prolonged  walking  PERTINENT HISTORY:  L4-5 fusion 2022, arthritis  PAIN:  Are you having pain? Yes: NPRS scale: 3/10 currently, 8/10 with certain movements/10 Pain location: low back both sides Pain description: grabbing, sore Aggravating factors: prolonged walk, stand, sit Relieving factors: change positions  PRECAUTIONS: None  WEIGHT BEARING RESTRICTIONS: No  FALLS:  Has patient fallen in last 6 months? No  OCCUPATION: Disability due to back issues, enjoys playing bass guitar  PLOF: Independent  PATIENT GOALS: reduce pain, be able to return to hour long walks without pain   OBJECTIVE:   DIAGNOSTIC FINDINGS:  MRI: Multilevel degenerative changes of the lumbar spine, worst at L2-L3 and L3-L4.   LUMBAR ROM:   AROM eval  Flexion WFL  Extension Limited 50%  Right lateral flexion Limited 25% pain  Left lateral flexion Limited 25% pain  Right rotation WFL  Left rotation WFL   (Blank rows = not tested)  LOWER EXTREMITY ROM:     Active  Right eval Left eval  Hip flexion    Hip extension    Hip abduction    Hip adduction    Hip internal rotation 5 To neutral  Hip external rotation    Knee flexion    Knee extension    Ankle dorsiflexion    Ankle plantarflexion    Ankle inversion    Ankle eversion     (Blank rows = not tested)  LOWER EXTREMITY MMT:    MMT Right eval Left eval  Hip flexion 4+ 4+  Hip extension 4- 4-  Hip abduction 4 4  Hip adduction    Hip internal rotation    Hip external rotation    Knee flexion    Knee extension    Ankle dorsiflexion    Ankle plantarflexion    Ankle inversion    Ankle eversion     (Blank rows = not tested)  SLR Negative bilat FOTO 45 (04/30/22)  TODAY'S TREATMENT:                                                                                                                              [facility] Adult PT Treatment:                                                DATE: 04/30/22 Therapeutic  Exercise: Nustep L6 x 5 min for  warm up Row 15# x 20 Shoulder ext 15 # x 20 Resisted walking backward 15# x 10 Resisted walking laterally 10# x 8 bilat Seated HS stretch 3 x 20 sec bilat Seated figure 4 stretch 2 x 30 sec bilat Physioball roll outs all directions x 10  [facility] Adult PT Treatment:                                                DATE: 04/23/22 Therapeutic Exercise: Nustep L6 x 5 min for warm up Pallof 10# x 10 bilat Row 15# x 20 Shoulder ext 15 # x 20 Resisted walking backward 15# x 10 Resisted walking laterally 10# x 8 bilat Supine sciatic nerve glide x 10 bilat SKTC x 30 sec bilat Piriformis stretch 2 x 30 sec bilat LTR x 2 min in pain free range    PATIENT EDUCATION:  Education details: PT POC and goals, HEP Person educated: Patient Education method: Explanation, Demonstration, and Handouts Education comprehension: verbalized understanding and returned demonstration  HOME EXERCISE PROGRAM: Access Code: [email or web address] URL: [email or web address] Date: 04/23/2022 Prepared by: Reggy Eye  Exercises - Supine Lower Trunk Rotation  - 1 x daily - 7 x weekly - 1 sets - 10 reps - 10 seconds hold - Seated Pelvic Tilt  - 1 x daily - 7 x weekly - 3 sets - 10 reps - Side Stepping with Resistance at Ankles  - 1 x daily - 7 x weekly - 3 sets - 10 reps - Squat with Chair Touch  - 1 x daily - 7 x weekly - 3 sets - 10 reps - Supine 90/90 Sciatic Nerve Glide with Knee Flexion/Extension  - 1 x daily - 7 x weekly - 1 sets - 10 reps  ASSESSMENT:  CLINICAL IMPRESSION: Pt with good tolerance to exercises during treatment with no c/o increased symptoms. PT continued to educate pt on importance of continued core strengthening and flexibility to reduce pain. Pt wishes to hold PT until he sees MD.    GOALS: Goals reviewed with patient? Yes  SHORT TERM GOALS: Target date: 04/30/2022    Pt will be independent in initial HEP Baseline: Goal status: MET  LONG TERM GOALS: Target date:  05/28/2022    Pt will be independent with advanced HEP Baseline:  Goal status: NOT MET  2.  Pt will improve FOTO to >= 57 to demo improved functional mobility Baseline:  Goal status: NOT MET  3.  Pt will tolerate walking x 1 hour with pain <= 2/10 to return to prior fitness routine Baseline: hurting >2/10 after 15 minutes Goal status: NOT MET  4.  Pt will improve bilat LE strength to 4+/5 to improve standing tolerance Baseline:  Goal status: NOT MET   PLAN:  PT FREQUENCY: 2x/week  PT DURATION: 6 weeks  PLANNED INTERVENTIONS: Therapeutic exercises, Therapeutic activity, Neuromuscular re-education, Balance training, Gait training, Patient/Family education, Self Care, Joint mobilization, Aquatic Therapy, Dry Needling, Electrical stimulation, Cryotherapy, Moist heat, Taping, Ultrasound, Ionotophoresis 4mg /ml Dexamethasone, Manual therapy, and Re-evaluation.  PLAN FOR NEXT SESSION: core and hip strength, lumbar mobility, flexibility  PHYSICAL THERAPY DISCHARGE SUMMARY  Visits from Start of Care: 3  Current functional level related to goals / functional outcomes: Improving activity tolerance   Remaining deficits: See above   Education / Equipment: HEP  Patient agrees to discharge. Patient goals were partially met. Patient is being discharged due to not returning since the last visit. Reggy Eye, PT,DPT01/30/259:53 AM   Reggy Eye, PT 04/30/2022, 12:23 PM

## 2022-05-03 ENCOUNTER — Ambulatory Visit: Payer: 59 | Admitting: Sports Medicine

## 2022-05-03 DIAGNOSIS — M48062 Spinal stenosis, lumbar region with neurogenic claudication: Secondary | ICD-10-CM | POA: Diagnosis not present

## 2022-05-03 MED ORDER — PREDNISONE 50 MG PO TABS: ORAL_TABLET | ORAL | 0 refills | Status: DC

## 2022-05-03 NOTE — Progress Notes (Signed)
    Procedures performed today:    None.  Independent interpretation of notes and tests performed by another provider:   MRI personally reviewed, stable fusion L4-L5 with adjacent level disease and moderate central canal stenosis L3-L4.  Brief History, Exam, Impression, and Recommendations:    Lumbar spinal stenosis status post L4-L5 PLIF Known lumbar DDD status post fusion L4-L5 with Dr. Lovell Sheehan, now having increasing pain, more recent MRI performed by Dr. Lovell Sheehan does show adjacent level disease with moderate central canal stenosis L3-L4. Proceeding with L3-L4 epidural.    ____________________________________________ [name]. Benjamin Stain, M.D., ABFM., CAQSM., AME. Primary Care and Sports Medicine [facility]  Adjunct Professor of Family Medicine  [employer]  Restaurant manager, fast food

## 2022-05-03 NOTE — Addendum Note (Signed)
Addended by: Monica Becton on: 05/03/2022 09:04 AM   Modules accepted: Orders

## 2022-05-03 NOTE — Assessment & Plan Note (Signed)
Known lumbar DDD status post fusion L4-L5 with Dr. Lovell Sheehan, now having increasing pain, more recent MRI performed by Dr. Lovell Sheehan does show adjacent level disease with moderate central canal stenosis L3-L4. Proceeding with L3-L4 epidural.

## 2022-05-13 ENCOUNTER — Other Ambulatory Visit: Payer: Self-pay | Admitting: Family Medicine

## 2022-05-13 DIAGNOSIS — R252 Cramp and spasm: Secondary | ICD-10-CM

## 2022-05-13 DIAGNOSIS — I739 Peripheral vascular disease, unspecified: Secondary | ICD-10-CM

## 2022-05-21 ENCOUNTER — Other Ambulatory Visit: Payer: Self-pay | Admitting: Family Medicine

## 2022-05-21 DIAGNOSIS — R252 Cramp and spasm: Secondary | ICD-10-CM

## 2022-05-21 DIAGNOSIS — I739 Peripheral vascular disease, unspecified: Secondary | ICD-10-CM

## 2022-05-24 ENCOUNTER — Telehealth: Payer: Self-pay | Admitting: Family Medicine

## 2022-05-24 NOTE — Telephone Encounter (Signed)
Patient called [facility] is needing something stated the patient can come off of blood thinners.Marland Kitchen

## 2022-05-29 NOTE — Telephone Encounter (Signed)
Patient advised. He said we had to send an order to [facility]. I called and left a message with [facility]. I am not sure where to send the order/recommendations.

## 2022-05-30 ENCOUNTER — Encounter: Payer: Self-pay | Admitting: Family Medicine

## 2022-05-30 NOTE — Telephone Encounter (Signed)
Cathy from [facility] sent an Epic IM with fax number. Faxed the letter to [facility].   Fax [phone]

## 2022-06-17 ENCOUNTER — Other Ambulatory Visit: Payer: Self-pay | Admitting: Neurosurgery

## 2022-06-17 DIAGNOSIS — M48062 Spinal stenosis, lumbar region with neurogenic claudication: Secondary | ICD-10-CM

## 2022-06-26 ENCOUNTER — Inpatient Hospital Stay
Admission: RE | Admit: 2022-06-26 | Discharge: 2022-06-26 | Disposition: A | Discharge status: Home / Self Care | Payer: 59 | Source: Ambulatory Visit | Attending: Sports Medicine | Admitting: Sports Medicine

## 2022-06-26 ENCOUNTER — Ambulatory Visit
Admission: RE | Admit: 2022-06-26 | Discharge: 2022-06-26 | Disposition: A | Discharge status: Home / Self Care | Payer: 59 | Source: Ambulatory Visit

## 2022-06-26 ENCOUNTER — Ambulatory Visit
Admission: RE | Admit: 2022-06-26 | Discharge: 2022-06-26 | Disposition: A | Discharge status: Home / Self Care | Payer: 59 | Source: Ambulatory Visit | Attending: Neurosurgery | Admitting: Neurosurgery

## 2022-06-26 DIAGNOSIS — M48062 Spinal stenosis, lumbar region with neurogenic claudication: Secondary | ICD-10-CM

## 2022-06-26 MED ORDER — DIAZEPAM 5 MG PO TABS: 10.0000 mg | ORAL_TABLET | Freq: Once | ORAL | Status: DC

## 2022-06-26 MED ORDER — IOPAMIDOL (ISOVUE-M 200) INJECTION 41%
15.0000 mL | Freq: Once | INTRAMUSCULAR | Status: AC
  Administered 2022-06-26: 15 mL via INTRATHECAL

## 2022-06-26 MED ORDER — MEPERIDINE HCL 50 MG/ML IJ SOLN: 50.0000 mg | Freq: Once | INTRAMUSCULAR | Status: DC | PRN

## 2022-06-26 MED ORDER — ONDANSETRON HCL 4 MG/2ML IJ SOLN: 4.0000 mg | Freq: Once | INTRAMUSCULAR | Status: DC | PRN

## 2022-06-26 NOTE — Discharge Instructions (Signed)
Myelogram Discharge Instructions  Go home and rest quietly as needed. You may resume normal activities; however, do not exert yourself strongly or do any heavy lifting today and tomorrow.   DO NOT drive today.    You may resume your normal diet and medications unless otherwise indicated. Drink lots of extra fluids today and tomorrow.   The incidence of headache, nausea, or vomiting is about 5% (one in 20 patients).  If you develop a headache, lie flat for 24 hours and drink plenty of fluids until the headache goes away.  Caffeinated beverages may be helpful. If when you get up you still have a headache when standing, go back to bed and force fluids for another 24 hours.   If you develop severe nausea and vomiting or a headache that does not go away with the flat bedrest after 48 hours, please call [phone].   Call your physician for a follow-up appointment.  The results of your myelogram will be sent directly to your physician by the following day.  If you have any questions or if complications develop after you arrive home, please call [phone].  Discharge instructions have been explained to the patient.  The patient, or the person responsible for the patient, fully understands these instructions.   Thank you for visiting our office today.   MAY RESUME YOUR PLETAL POST PROCEDURE

## 2022-06-26 NOTE — Discharge Instructions (Addendum)

## 2022-07-11 ENCOUNTER — Other Ambulatory Visit: Payer: Self-pay | Admitting: Neurosurgery

## 2022-07-24 ENCOUNTER — Ambulatory Visit: Payer: 59 | Admitting: Family Medicine

## 2022-07-25 ENCOUNTER — Ambulatory Visit: Payer: 59 | Admitting: Family Medicine

## 2022-07-26 ENCOUNTER — Other Ambulatory Visit: Payer: Self-pay | Admitting: Neurosurgery

## 2022-08-02 ENCOUNTER — Ambulatory Visit (INDEPENDENT_AMBULATORY_CARE_PROVIDER_SITE_OTHER): Payer: 59 | Admitting: Family Medicine

## 2022-08-02 ENCOUNTER — Encounter: Payer: Self-pay | Admitting: Family Medicine

## 2022-08-02 VITALS — BP 116/66 | HR 63 | Ht 70.0 in | Wt 200.0 lb

## 2022-08-02 DIAGNOSIS — I739 Peripheral vascular disease, unspecified: Secondary | ICD-10-CM | POA: Diagnosis not present

## 2022-08-02 DIAGNOSIS — Z01818 Encounter for other preprocedural examination: Secondary | ICD-10-CM | POA: Diagnosis not present

## 2022-08-02 NOTE — Progress Notes (Unsigned)
Benjamin Warren - 63 y.o. male MRN [id number]  Date of birth: [birth year]  Subjective No chief complaint on file.   HPI Benjamin Warren is a 62 y.o. male here today for surgical clearance.  He has upcoming spine surgery.  He has history of PAD, remains on Pletal.  He denies anginal symptoms.  Reports that PAD symptoms are well controlled at this time. He No Known Allergies  Past Medical History:  Diagnosis Date  . Anxiety 11/21/2016  . Arthritis   . Chronic pain syndrome   . Crepitus of left TMJ on opening of jaw 11/21/2016  . Depression   . Fever blister 11/21/2016  . Fibromyalgia   . GERD (gastroesophageal reflux disease)   . Left rotator cuff tear 10/09/2012  . 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 PONV (postoperative nausea and vomiting)   . Sleep apnea    had test-no dr told him he needed cpap  . TMJ syndrome   . Vitamin D deficiency 11/21/2016    Past Surgical History:  Procedure Laterality Date  . ELBOW ARTHROPLASTY  1996   right  . HARDWARE REMOVAL  1997   rt wrist  . ORIF WRIST FRACTURE  1997   right  . ROTATOR CUFF REPAIR Right    January 2021  . SHOULDER ARTHROSCOPY  1996   right  . SHOULDER ARTHROSCOPY WITH ROTATOR CUFF REPAIR AND SUBACROMIAL DECOMPRESSION Left 10/09/2012   Procedure: LEFT SHOULDER ARTHROSCOPY WITH SUBACROMIAL DECOMPRESSION, DISTAL CLAVICLE EXCISION DEBRIDEMENT AND ROTATOR CUFF REPAIR;  Surgeon: Eulas Post, MD;  Location: [facility];  Service: Orthopedics;  Laterality: Left;  Marland Kitchen VASECTOMY      Social History   Socioeconomic History  . Marital status: Married    Spouse name: Not on file  . Number of children: Not on file  . Years of education: Not on file  . Highest education level: Not on file  Occupational History  . Not on file  Tobacco Use  . Smoking status: Former    Current packs/day: 0.00    Average packs/day: 0.5 packs/day for 6.0 years (3.0 ttl pk-yrs)    Types: Cigarettes    Start date: 10/02/1976    Quit date:  10/03/1982    Years since quitting: 39.8  . Smokeless tobacco: Never  Vaping Use  . Vaping status: Never Used  Substance and Sexual Activity  . Alcohol use: Yes    Comment: occ  . Drug use: Never  . Sexual activity: Yes    Birth control/protection: Post-menopausal  Other Topics Concern  . Not on file  Social History Narrative  . Not on file   Social Determinants of Health   Financial Resource Strain: Not on file  Food Insecurity: Not on file  Transportation Needs: Not on file  Physical Activity: Not on file  Stress: Not on file  Social Connections: Unknown (05/25/2021)   Received from [facility]   Social Network   . Social Network: Not on file    Family History  Problem Relation Age of Onset  . Alcohol abuse Mother   . Hypertension Mother   . Alcohol abuse Other        FAMILY HISTORY  . Arthritis Other        FAMILY HISTORY  . Stroke Other   . Pancreatic cancer Brother   . Liver cancer Brother     Health Maintenance  Topic Date Due  . INFLUENZA VACCINE  08/15/2022  . Colonoscopy  10/15/2023  .  DTaP/Tdap/Td (4 - Td or Tdap) 06/23/2026  . COVID-19 Vaccine  Completed  . Hepatitis C Screening  Completed  . HIV Screening  Completed  . Zoster Vaccines- Shingrix  Completed  . HPV VACCINES  Aged Out     ----------------------------------------------------------------------------------------------------------------------------------------------------------------------------------------------------------------- Physical Exam There were no vitals taken for this visit.  Physical Exam  ------------------------------------------------------------------------------------------------------------------------------------------------------------------------------------------------------------------- Assessment and Plan  No problem-specific Assessment & Plan notes found for this encounter.   No orders of the defined types were placed in this  encounter.   No follow-ups on file.    This visit occurred during the SARS-CoV-2 public health emergency.  Safety protocols were in place, including screening questions prior to the visit, additional usage of staff PPE, and extensive cleaning of exam room while observing appropriate contact time as indicated for disinfecting solutions.

## 2022-08-02 NOTE — Progress Notes (Signed)
Surgical Instructions    Your procedure is scheduled on Thursday August 1st.  Report to [facility] Main Entrance "A" at 5:30 A.M., then check in with the Admitting office.  Call this number if you have problems the morning of surgery:  [phone]   If you have any questions prior to your surgery date call [phone]: Open Monday-Friday 8am-4pm If you experience any cold or flu symptoms such as cough, fever, chills, shortness of breath, etc. between now and your scheduled surgery, please notify us at the above number     Remember:  Do not eat or drink after midnight the night before your surgery     Take these medicines the morning of surgery with A SIP OF WATER: diazepam (VALIUM) 10 MG tablet  omeprazole (PRILOSEC) 20 MG capsule  rosuvastatin (CRESTOR) 20 MG tablet   IF NEEDED valACYclovir (VALTREX) 1000 MG tablet    Please contact your surgeon regarding your Pletal.  Make sure it is safe to continue prior to surgery.   As of today, STOP taking any Aspirin (unless otherwise instructed by your surgeon) LODINE, Aleve, Naproxen, Ibuprofen, Motrin, Advil, Goody's, BC's, all herbal medications, fish oil, and all vitamins.           Do not wear jewelry  Do not wear lotions, powders, cologne or deodorant. Do not shave 48 hours prior to surgery.  Men may shave face and neck. Do not bring valuables to the hospital. Do not wear nail polish   is not responsible for any belongings or valuables.    Do NOT Smoke (Tobacco/Vaping)  24 hours prior to your procedure  If you use a CPAP at night, you may bring your mask for your overnight stay.   Contacts, glasses, hearing aids, dentures or partials may not be worn into surgery, please bring cases for these belongings   For patients admitted to the hospital, discharge time will be determined by your treatment team.   Patients discharged the day of surgery will not be allowed to drive home, and someone needs to stay with them  for 24 hours.   SURGICAL WAITING ROOM VISITATION Patients having surgery or a procedure may have no more than 2 support people in the waiting area - these visitors may rotate.   Children under the age of 55 must have an adult with them who is not the patient. If the patient needs to stay at the hospital during part of their recovery, the visitor guidelines for inpatient rooms apply. Pre-op nurse will coordinate an appropriate time for 1 support person to accompany patient in pre-op.  This support person may not rotate.   Please refer to [email or web address] for the visitor guidelines for Inpatients (after your surgery is over and you are in a regular room).    Special instructions:    Oral Hygiene is also important to reduce your risk of infection.  Remember - BRUSH YOUR TEETH THE MORNING OF SURGERY WITH YOUR REGULAR TOOTHPASTE     Pre-operative 5 CHG Bath Instructions   You can play a key role in reducing the risk of infection after surgery. Your skin needs to be as free of germs as possible. You can reduce the number of germs on your skin by washing with CHG (chlorhexidine gluconate) soap before surgery. CHG is an antiseptic soap t hat kills germs and continues to kill germs even after washing.   DO NOT use if you have an allergy to chlorhexidine/CHG or antibacterial soaps. If your skin becomes  reddened or irritated, stop using the CHG and notify one of our RNs at [phone].   Please shower with the CHG soap starting 4 days before surgery using the following schedule:     Please keep in mind the following:  DO NOT shave, including legs and underarms, starting the day of your first shower.   You may shave your face at any point before/day of surgery.  Place clean sheets on your bed the day you start using CHG soap. Use a clean washcloth (not used since being washed) for each shower. DO NOT sleep with pets once you start using the  CHG.   CHG Shower Instructions:  If you choose to wash your hair and private area, wash first with your normal shampoo/soap.  After you use shampoo/soap, rinse your hair and body thoroughly to remove shampoo/soap residue.  Turn the water OFF and apply about 3 tablespoons (45 ml) of CHG soap to a CLEAN washcloth.  Apply CHG soap ONLY FROM YOUR NECK DOWN TO YOUR TOES (washing for 3-5 minutes)  DO NOT use CHG soap on face, private areas, open wounds, or sores.  Pay special attention to the area where your surgery is being performed.  If you are having back surgery, having someone wash your back for you may be helpful. Wait 2 minutes after CHG soap is applied, then you may rinse off the CHG soap.  Pat dry with a clean towel  Put on clean clothes/pajamas   If you choose to wear lotion, please use ONLY the CHG-compatible lotions on the back of this paper.     Additional instructions for the day of surgery: DO NOT APPLY any lotions, deodorants, cologne, or perfumes.   Put on clean/comfortable clothes.  Brush your teeth.  Ask your nurse before applying any prescription medications to the skin.      CHG Compatible Lotions   Aveeno Moisturizing lotion  Cetaphil Moisturizing Cream  Cetaphil Moisturizing Lotion  Clairol Herbal Essence Moisturizing Lotion, Dry Skin  Clairol Herbal Essence Moisturizing Lotion, Extra Dry Skin  Clairol Herbal Essence Moisturizing Lotion, Normal Skin  Curel Age Defying Therapeutic Moisturizing Lotion with Alpha Hydroxy  Curel Extreme Care Body Lotion  Curel Soothing Hands Moisturizing Hand Lotion  Curel Therapeutic Moisturizing Cream, Fragrance-Free  Curel Therapeutic Moisturizing Lotion, Fragrance-Free  Curel Therapeutic Moisturizing Lotion, Original Formula  Eucerin Daily Replenishing Lotion  Eucerin Dry Skin Therapy Plus Alpha Hydroxy Crme  Eucerin Dry Skin Therapy Plus Alpha Hydroxy Lotion  Eucerin Original Crme  Eucerin Original Lotion  Eucerin Plus  Crme Eucerin Plus Lotion  Eucerin TriLipid Replenishing Lotion  Keri Anti-Bacterial Hand Lotion  Keri Deep Conditioning Original Lotion Dry Skin Formula Softly Scented  Keri Deep Conditioning Original Lotion, Fragrance Free Sensitive Skin Formula  Keri Lotion Fast Absorbing Fragrance Free Sensitive Skin Formula  Keri Lotion Fast Absorbing Softly Scented Dry Skin Formula  Keri Original Lotion  Keri Skin Renewal Lotion Keri Silky Smooth Lotion  Keri Silky Smooth Sensitive Skin Lotion  Nivea Body Creamy Conditioning Oil  Nivea Body Extra Enriched Lotion  Nivea Body Original Lotion  Nivea Body Sheer Moisturizing Lotion Nivea Crme  Nivea Skin Firming Lotion  NutraDerm 30 Skin Lotion  NutraDerm Skin Lotion  NutraDerm Therapeutic Skin Cream  NutraDerm Therapeutic Skin Lotion  ProShield Protective Hand Cream  Provon moisturizing lotion    If you received a COVID test during your pre-op visit, it is requested that you wear a mask when out in public, stay away from anyone  that may not be feeling well, and notify your surgeon if you develop symptoms. If you have been in contact with anyone that has tested positive in the last 10 days, please notify your surgeon.    Please read over the following fact sheets that you were given.

## 2022-08-02 NOTE — Patient Instructions (Addendum)
Hold Pletal 1 week prior to procedure I would recommend holding Etodolac for 1 week prior to surgery as well.

## 2022-08-03 LAB — CMP14+EGFR
ALT: 21 IU/L (ref 0–44)
AST: 20 IU/L (ref 0–40)
Albumin: 4.4 g/dL (ref 3.8–4.9)
Alkaline Phosphatase: 87 IU/L (ref 44–121)
BUN/Creatinine Ratio: 16 (ref 9–20)
BUN: 18 mg/dL (ref 6–24)
Bilirubin Total: 0.5 mg/dL (ref 0.0–1.2)
CO2: 24 mmol/L (ref 20–29)
Calcium: 9.5 mg/dL (ref 8.7–10.2)
Chloride: 104 mmol/L (ref 96–106)
Creatinine, Ser: 1.16 mg/dL (ref 0.76–1.27)
Globulin, Total: 2.3 g/dL (ref 1.5–4.5)
Glucose: 82 mg/dL (ref 70–99)
Potassium: 4.7 mmol/L (ref 3.5–5.2)
Sodium: 142 mmol/L (ref 134–144)
Total Protein: 6.7 g/dL (ref 6.0–8.5)
eGFR: 73 mL/min/{1.73_m2} (ref >59)

## 2022-08-03 LAB — CBC WITH DIFFERENTIAL/PLATELET
Basophils Absolute: 0.1 10*3/uL (ref 0.0–0.2)
Basos: 1 %
EOS (ABSOLUTE): 0.2 10*3/uL (ref 0.0–0.4)
Eos: 2 %
Hematocrit: 46.7 % (ref 37.5–51.0)
Hemoglobin: 15.4 g/dL (ref 13.0–17.7)
Immature Grans (Abs): 0 10*3/uL (ref 0.0–0.1)
Immature Granulocytes: 0 %
Lymphocytes Absolute: 2.6 10*3/uL (ref 0.7–3.1)
Lymphs: 29 %
MCH: 30.4 pg (ref 26.6–33.0)
MCHC: 33 g/dL (ref 31.5–35.7)
MCV: 92 fL (ref 79–97)
Monocytes Absolute: 0.9 10*3/uL (ref 0.1–0.9)
Monocytes: 10 %
Neutrophils Absolute: 5.2 10*3/uL (ref 1.4–7.0)
Neutrophils: 58 %
Platelets: 277 10*3/uL (ref 150–450)
RBC: 5.07 x10E6/uL (ref 4.14–5.80)
RDW: 12.8 % (ref 11.6–15.4)
WBC: 8.9 10*3/uL (ref 3.4–10.8)

## 2022-08-03 LAB — LIPID PANEL WITH LDL/HDL RATIO
Cholesterol, Total: 118 mg/dL (ref 100–199)
HDL: 36 mg/dL — ABNORMAL LOW (ref >39)
LDL Chol Calc (NIH): 62 mg/dL (ref 0–99)
LDL/HDL Ratio: 1.7 ratio (ref 0.0–3.6)
Triglycerides: 108 mg/dL (ref 0–149)
VLDL Cholesterol Cal: 20 mg/dL (ref 5–40)

## 2022-08-04 DIAGNOSIS — Z01818 Encounter for other preprocedural examination: Secondary | ICD-10-CM | POA: Insufficient documentation

## 2022-08-04 NOTE — Assessment & Plan Note (Signed)
He has revised cardiac risk index of 0 points.  Fairly low risk with spine surgery.  I think he may proceed with surgery as scheduled as well as his labs did not show any significant abnormalities.  He will need to hold Pletal for 1 week prior to surgery.  I also recommend that he hold his etodolac 1 week prior to surgery.

## 2022-08-05 ENCOUNTER — Other Ambulatory Visit: Payer: Self-pay

## 2022-08-05 ENCOUNTER — Encounter (HOSPITAL_COMMUNITY): Payer: Self-pay

## 2022-08-05 ENCOUNTER — Telehealth: Payer: Self-pay | Admitting: Family Medicine

## 2022-08-05 ENCOUNTER — Encounter (HOSPITAL_COMMUNITY)
Admission: RE | Admit: 2022-08-05 | Discharge: 2022-08-05 | Disposition: A | Discharge status: Home / Self Care | Payer: 59 | Source: Ambulatory Visit | Attending: Neurosurgery | Admitting: Neurosurgery

## 2022-08-05 VITALS — BP 146/82 | HR 56 | Temp 97.8°F | Resp 17 | Ht 70.0 in | Wt 200.0 lb

## 2022-08-05 DIAGNOSIS — I739 Peripheral vascular disease, unspecified: Secondary | ICD-10-CM

## 2022-08-05 DIAGNOSIS — G4733 Obstructive sleep apnea (adult) (pediatric): Secondary | ICD-10-CM | POA: Diagnosis not present

## 2022-08-05 DIAGNOSIS — Z01812 Encounter for preprocedural laboratory examination: Secondary | ICD-10-CM | POA: Insufficient documentation

## 2022-08-05 DIAGNOSIS — Z01818 Encounter for other preprocedural examination: Secondary | ICD-10-CM

## 2022-08-05 HISTORY — DX: Peripheral vascular disease, unspecified: I73.9

## 2022-08-05 HISTORY — DX: Headache, unspecified: R51.9

## 2022-08-05 LAB — SURGICAL PCR SCREEN
MRSA, PCR: NEGATIVE
Staphylococcus aureus: NEGATIVE

## 2022-08-05 LAB — TYPE AND SCREEN
ABO/RH(D): A NEG
Antibody Screen: NEGATIVE

## 2022-08-05 NOTE — Telephone Encounter (Signed)
The office is [facility].

## 2022-08-05 NOTE — Telephone Encounter (Signed)
Pt called. Did pcp get prelearance form from Dr. Lovell Sheehan office?

## 2022-08-05 NOTE — Pre-Procedure Instructions (Signed)
Surgical Instructions   Your procedure is scheduled on August 15, 2022 Report to [facility] Main Entrance "A" at 5:30 A.M., then check in with the Admitting office. Any questions or running late day of surgery: call [phone]  Questions prior to your surgery date: call [phone], Monday-Friday, 8am-4pm. If you experience any cold or flu symptoms such as cough, fever, chills, shortness of breath, etc. between now and your scheduled surgery, please notify us at the above number.     Remember:  Do not eat or drink after midnight the night before your surgery    Take these medicines the morning of surgery with A SIP OF WATER: diazepam (VALIUM)  omeprazole (PRILOSEC)  rosuvastatin (CRESTOR)    May take these medicines IF NEEDED: valACYclovir (VALTREX)    Follow your surgeon's instructions on when to stop cilostazol (PLETAL).  If no instructions were given by your surgeon then you will need to call the office to get those instructions.     One week prior to surgery, STOP taking any Aspirin (unless otherwise instructed by your surgeon) Aleve, Naproxen, Ibuprofen, Motrin, Advil, Goody's, BC's, all herbal medications, fish oil, and non-prescription vitamins. This includes your medication: etodolac (LODINE XL)                      Do NOT Smoke (Tobacco/Vaping) for 24 hours prior to your procedure.  If you use a CPAP at night, you may bring your mask/headgear for your overnight stay.   You will be asked to remove any contacts, glasses, piercing's, hearing aid's, dentures/partials prior to surgery. Please bring cases for these items if needed.    Patients discharged the day of surgery will not be allowed to drive home, and someone needs to stay with them for 24 hours.  SURGICAL WAITING ROOM VISITATION Patients may have no more than 2 support people in the waiting area - these visitors may rotate.   Pre-op nurse will coordinate an appropriate time for 1 ADULT support person, who may  not rotate, to accompany patient in pre-op.  Children under the age of 45 must have an adult with them who is not the patient and must remain in the main waiting area with an adult.  If the patient needs to stay at the hospital during part of their recovery, the visitor guidelines for inpatient rooms apply.  Please refer to the [facility] website for the visitor guidelines for any additional information.   If you received a COVID test during your pre-op visit  it is requested that you wear a mask when out in public, stay away from anyone that may not be feeling well and notify your surgeon if you develop symptoms. If you have been in contact with anyone that has tested positive in the last 10 days please notify you surgeon.      Pre-operative 5 CHG Bathing Instructions   You can play a key role in reducing the risk of infection after surgery. Your skin needs to be as free of germs as possible. You can reduce the number of germs on your skin by washing with CHG (chlorhexidine gluconate) soap before surgery. CHG is an antiseptic soap that kills germs and continues to kill germs even after washing.   DO NOT use if you have an allergy to chlorhexidine/CHG or antibacterial soaps. If your skin becomes reddened or irritated, stop using the CHG and notify one of our RNs at [phone].   Please shower with the CHG soap  starting 4 days before surgery using the following schedule:     Please keep in mind the following:  DO NOT shave, including legs and underarms, starting the day of your first shower.   You may shave your face at any point before/day of surgery.  Place clean sheets on your bed the day you start using CHG soap. Use a clean washcloth (not used since being washed) for each shower. DO NOT sleep with pets once you start using the CHG.   CHG Shower Instructions:  If you choose to wash your hair and private area, wash first with your normal shampoo/soap.  After you use shampoo/soap,  rinse your hair and body thoroughly to remove shampoo/soap residue.  Turn the water OFF and apply about 3 tablespoons (45 ml) of CHG soap to a CLEAN washcloth.  Apply CHG soap ONLY FROM YOUR NECK DOWN TO YOUR TOES (washing for 3-5 minutes)  DO NOT use CHG soap on face, private areas, open wounds, or sores.  Pay special attention to the area where your surgery is being performed.  If you are having back surgery, having someone wash your back for you may be helpful. Wait 2 minutes after CHG soap is applied, then you may rinse off the CHG soap.  Pat dry with a clean towel  Put on clean clothes/pajamas   If you choose to wear lotion, please use ONLY the CHG-compatible lotions on the back of this paper.   Additional instructions for the day of surgery: DO NOT APPLY any lotions, deodorants, cologne, or perfumes.   Do not bring valuables to the hospital. [facility] is not responsible for any belongings/valuables. Do not wear nail polish, gel polish, artificial nails, or any other type of covering on natural nails (fingers and toes) Do not wear jewelry or makeup Put on clean/comfortable clothes.  Please brush your teeth.  Ask your nurse before applying any prescription medications to the skin.     CHG Compatible Lotions   Aveeno Moisturizing lotion  Cetaphil Moisturizing Cream  Cetaphil Moisturizing Lotion  Clairol Herbal Essence Moisturizing Lotion, Dry Skin  Clairol Herbal Essence Moisturizing Lotion, Extra Dry Skin  Clairol Herbal Essence Moisturizing Lotion, Normal Skin  Curel Age Defying Therapeutic Moisturizing Lotion with Alpha Hydroxy  Curel Extreme Care Body Lotion  Curel Soothing Hands Moisturizing Hand Lotion  Curel Therapeutic Moisturizing Cream, Fragrance-Free  Curel Therapeutic Moisturizing Lotion, Fragrance-Free  Curel Therapeutic Moisturizing Lotion, Original Formula  Eucerin Daily Replenishing Lotion  Eucerin Dry Skin Therapy Plus Alpha Hydroxy Crme  Eucerin Dry Skin  Therapy Plus Alpha Hydroxy Lotion  Eucerin Original Crme  Eucerin Original Lotion  Eucerin Plus Crme Eucerin Plus Lotion  Eucerin TriLipid Replenishing Lotion  Keri Anti-Bacterial Hand Lotion  Keri Deep Conditioning Original Lotion Dry Skin Formula Softly Scented  Keri Deep Conditioning Original Lotion, Fragrance Free Sensitive Skin Formula  Keri Lotion Fast Absorbing Fragrance Free Sensitive Skin Formula  Keri Lotion Fast Absorbing Softly Scented Dry Skin Formula  Keri Original Lotion  Keri Skin Renewal Lotion Keri Silky Smooth Lotion  Keri Silky Smooth Sensitive Skin Lotion  Nivea Body Creamy Conditioning Oil  Nivea Body Extra Enriched Lotion  Nivea Body Original Lotion  Nivea Body Sheer Moisturizing Lotion Nivea Crme  Nivea Skin Firming Lotion  NutraDerm 30 Skin Lotion  NutraDerm Skin Lotion  NutraDerm Therapeutic Skin Cream  NutraDerm Therapeutic Skin Lotion  ProShield Protective Hand Cream  Provon moisturizing lotion  Please read over the following fact sheets that you were given.

## 2022-08-05 NOTE — Progress Notes (Signed)
PCP - Everrett Coombe, DO (Surgical clearance visit 08/02/2022) Cardiologist - Denies  PPM/ICD - Denies Device Orders - n/a Rep Notified - n/a  Chest x-ray - Denies EKG - 08/02/2022 - Tracing requested  Stress Test - Denies ECHO - 01/21/2014 Cardiac Cath - Denies  Sleep Study - +OSA. Pt wears oral device some nights, but not every night  No DM  Last dose of GLP1 agonist- n/a GLP1 instructions: n/a  Blood Thinner Instructions: Pt instructed to hold Pletal for five days. Last dose will be July 26th Aspirin Instructions: n/a  NPO after midnight   COVID TEST- n/a   Anesthesia review: Yes. Surgical clearance by PCP. EKG tracing requested.   Patient denies shortness of breath, fever, cough and chest pain at PAT appointment. Pt denies any respiratory illness/infection in the last two months.   All instructions explained to the patient, with a verbal understanding of the material. Patient agrees to go over the instructions while at home for a better understanding. Patient also instructed to self quarantine after being tested for COVID-19. The opportunity to ask questions was provided.

## 2022-08-07 NOTE — Anesthesia Preprocedure Evaluation (Addendum)
Anesthesia Evaluation  Patient identified by MRN, date of birth, ID band Patient awake    Reviewed: Allergy & Precautions, H&P , NPO status , Patient's Chart, lab work & pertinent test results  History of Anesthesia Complications (+) DIFFICULT AIRWAY and history of anesthetic complications  Airway Mallampati: III  TM Distance: <3 FB Neck ROM: Full    Dental no notable dental hx.    Pulmonary sleep apnea , former smoker   Pulmonary exam normal breath sounds clear to auscultation       Cardiovascular + Peripheral Vascular Disease  Normal cardiovascular exam Rhythm:Regular Rate:Normal     Neuro/Psych negative neurological ROS  negative psych ROS   GI/Hepatic Neg liver ROS,GERD  ,,  Endo/Other  negative endocrine ROS    Renal/GU negative Renal ROS  negative genitourinary   Musculoskeletal negative musculoskeletal ROS (+)    Abdominal   Peds negative pediatric ROS (+)  Hematology negative hematology ROS (+)   Anesthesia Other Findings   Reproductive/Obstetrics negative OB ROS                             Anesthesia Physical Anesthesia Plan  ASA: 3  Anesthesia Plan: General   Post-op Pain Management: Ketamine IV*   Induction: Intravenous  PONV Risk Score and Plan: 2 and Ondansetron, Dexamethasone, Midazolam and Treatment may vary due to age or medical condition  Airway Management Planned: Oral ETT and Video Laryngoscope Planned  Additional Equipment:   Intra-op Plan:   Post-operative Plan: Extubation in OR  Informed Consent: I have reviewed the patients History and Physical, chart, labs and discussed the procedure including the risks, benefits and alternatives for the proposed anesthesia with the patient or authorized representative who has indicated his/her understanding and acceptance.     Dental advisory given  Plan Discussed with: CRNA and Surgeon  Anesthesia Plan  Comments: (PAT note by Antionette Poles, PA-C: 62 yo male with pertinent hx including GERD, PONV, TMJ, OSA treated with oral device, PAD on pletal, difficult intubation.  Pt's chronic conditions are managed by PCP Dr. Everrett Coombe. He was seen on 08/02/22 for preop eval. Per note, "Pre-operative exam: He has revised cardiac risk index of 0 points.  Fairly low risk with spine surgery.  I think he may proceed with surgery as scheduled as well as his labs did not show any significant abnormalities.  He will need to hold Pletal for 1 week prior to surgery.  I also recommend that he hold his etodolac 1 week prior to surgery." He also cleared pt to hold pletal. Pt reports LD 08/09/22.  Elective glidescope used previously due to hx of difficult intubation.  Preop labs reviewed, unremarkable.   Per Dr. Ashley Royalty' note 08/02/22, EKG tracing done at that time showed Normal sinus rhythm.  Normal PR and QTc intervals.  No T wave abnormalities. Tracing has not yet been scanned into Epic.  TTE 01/21/2014: - Left ventricle: The cavity size was normal. Systolic function was  normal. The estimated ejection fraction was in the range of 60%  to 65%. Wall motion was normal; there were no regional wall  motion abnormalities. Left ventricular diastolic function  parameters were normal.  - Atrial septum: No defect or patent foramen ovale was identified.   )        Anesthesia Quick Evaluation

## 2022-08-07 NOTE — Progress Notes (Signed)
Anesthesia Chart Review:  62 yo male with pertinent hx including GERD, PONV, TMJ, OSA treated with oral device, PAD on pletal, difficult intubation.  Pt's chronic conditions are managed by PCP Dr. Everrett Coombe. He was seen on 08/02/22 for preop eval. Per note, "Pre-operative exam: He has revised cardiac risk index of 0 points.  Fairly low risk with spine surgery.  I think he may proceed with surgery as scheduled as well as his labs did not show any significant abnormalities.  He will need to hold Pletal for 1 week prior to surgery.  I also recommend that he hold his etodolac 1 week prior to surgery." He also cleared pt to hold pletal. Pt reports LD 08/09/22.  Elective glidescope used previously due to hx of difficult intubation.  Preop labs reviewed, unremarkable.   Per Dr. Ashley Royalty' note 08/02/22, EKG tracing done at that time showed Normal sinus rhythm.  Normal PR and QTc intervals.  No T wave abnormalities. Tracing has not yet been scanned into Epic.  TTE 01/21/2014: - Left ventricle: The cavity size was normal. Systolic function was    normal. The estimated ejection fraction was in the range of 60%    to 65%. Wall motion was normal; there were no regional wall    motion abnormalities. Left ventricular diastolic function    parameters were normal.  - Atrial septum: No defect or patent foramen ovale was identified.    Zannie Cove [facility] Short Stay Center/Anesthesiology Phone [phone] 08/07/2022 2:13 PM

## 2022-08-07 NOTE — Telephone Encounter (Signed)
Pre-Op form was faxed to [facility] on 08/05/2022 with confirmation received.

## 2022-08-15 ENCOUNTER — Other Ambulatory Visit: Payer: Self-pay

## 2022-08-15 ENCOUNTER — Inpatient Hospital Stay (HOSPITAL_COMMUNITY)
Admission: RE | Disposition: A | Discharge status: Home / Self Care | Payer: Self-pay | Source: Ambulatory Visit | Attending: Neurosurgery

## 2022-08-15 ENCOUNTER — Encounter (HOSPITAL_COMMUNITY): Payer: Self-pay | Admitting: Neurosurgery

## 2022-08-15 ENCOUNTER — Inpatient Hospital Stay (HOSPITAL_COMMUNITY): Payer: 59 | Admitting: Vascular Surgery

## 2022-08-15 ENCOUNTER — Inpatient Hospital Stay (HOSPITAL_COMMUNITY): Payer: 59 | Admitting: Certified Registered Nurse Anesthetist

## 2022-08-15 ENCOUNTER — Inpatient Hospital Stay (HOSPITAL_COMMUNITY): Payer: 59

## 2022-08-15 ENCOUNTER — Inpatient Hospital Stay (HOSPITAL_COMMUNITY)
Admission: RE | Admit: 2022-08-15 | Discharge: 2022-08-16 | DRG: 455 | Disposition: A | Discharge status: Home / Self Care | Payer: 59 | Source: Ambulatory Visit | Attending: Neurosurgery | Admitting: Neurosurgery

## 2022-08-15 DIAGNOSIS — M48062 Spinal stenosis, lumbar region with neurogenic claudication: Principal | ICD-10-CM | POA: Diagnosis present

## 2022-08-15 DIAGNOSIS — G894 Chronic pain syndrome: Secondary | ICD-10-CM | POA: Diagnosis present

## 2022-08-15 DIAGNOSIS — I739 Peripheral vascular disease, unspecified: Secondary | ICD-10-CM | POA: Diagnosis present

## 2022-08-15 DIAGNOSIS — Z800 Family history of malignant neoplasm of digestive organs: Secondary | ICD-10-CM

## 2022-08-15 DIAGNOSIS — M797 Fibromyalgia: Secondary | ICD-10-CM | POA: Diagnosis present

## 2022-08-15 DIAGNOSIS — Z823 Family history of stroke: Secondary | ICD-10-CM | POA: Diagnosis not present

## 2022-08-15 DIAGNOSIS — M5116 Intervertebral disc disorders with radiculopathy, lumbar region: Secondary | ICD-10-CM | POA: Diagnosis present

## 2022-08-15 DIAGNOSIS — G4733 Obstructive sleep apnea (adult) (pediatric): Secondary | ICD-10-CM

## 2022-08-15 DIAGNOSIS — Z96611 Presence of right artificial shoulder joint: Secondary | ICD-10-CM | POA: Diagnosis present

## 2022-08-15 DIAGNOSIS — Z8781 Personal history of (healed) traumatic fracture: Secondary | ICD-10-CM

## 2022-08-15 DIAGNOSIS — M1990 Unspecified osteoarthritis, unspecified site: Secondary | ICD-10-CM | POA: Diagnosis present

## 2022-08-15 DIAGNOSIS — Z79899 Other long term (current) drug therapy: Secondary | ICD-10-CM

## 2022-08-15 DIAGNOSIS — Z7902 Long term (current) use of antithrombotics/antiplatelets: Secondary | ICD-10-CM | POA: Diagnosis not present

## 2022-08-15 DIAGNOSIS — Z8249 Family history of ischemic heart disease and other diseases of the circulatory system: Secondary | ICD-10-CM | POA: Diagnosis not present

## 2022-08-15 DIAGNOSIS — Z811 Family history of alcohol abuse and dependence: Secondary | ICD-10-CM

## 2022-08-15 DIAGNOSIS — K219 Gastro-esophageal reflux disease without esophagitis: Secondary | ICD-10-CM | POA: Diagnosis present

## 2022-08-15 DIAGNOSIS — M47819 Spondylosis without myelopathy or radiculopathy, site unspecified: Secondary | ICD-10-CM | POA: Diagnosis present

## 2022-08-15 DIAGNOSIS — Z87891 Personal history of nicotine dependence: Secondary | ICD-10-CM | POA: Diagnosis not present

## 2022-08-15 SURGERY — POSTERIOR LUMBAR FUSION 2 LEVEL
Anesthesia: General

## 2022-08-15 MED ORDER — PANTOPRAZOLE SODIUM 40 MG PO TBEC
40.0000 mg | DELAYED_RELEASE_TABLET | Freq: Every day | ORAL | Status: DC
  Administered 2022-08-16: 40 mg via ORAL
  Filled 2022-08-15: qty 1

## 2022-08-15 MED ORDER — ACETAMINOPHEN 325 MG PO TABS: 650.0000 mg | ORAL_TABLET | ORAL | Status: DC | PRN

## 2022-08-15 MED ORDER — SUFENTANIL CITRATE 50 MCG/ML IV SOLN
0.3000 ug/kg/h | INTRAVENOUS | Status: DC
  Filled 2022-08-15: qty 1

## 2022-08-15 MED ORDER — HYDROMORPHONE HCL 1 MG/ML IJ SOLN
INTRAMUSCULAR | Status: AC
  Filled 2022-08-15: qty 1

## 2022-08-15 MED ORDER — BUPIVACAINE-EPINEPHRINE (PF) 0.5% -1:200000 IJ SOLN
INTRAMUSCULAR | Status: AC
  Filled 2022-08-15: qty 30

## 2022-08-15 MED ORDER — ACETAMINOPHEN 10 MG/ML IV SOLN: 1000.0000 mg | Freq: Once | INTRAVENOUS | Status: DC | PRN

## 2022-08-15 MED ORDER — ONDANSETRON HCL 4 MG PO TABS
4.0000 mg | ORAL_TABLET | Freq: Four times a day (QID) | ORAL | Status: DC | PRN
  Administered 2022-08-15: 4 mg via ORAL
  Filled 2022-08-15: qty 1

## 2022-08-15 MED ORDER — OXYCODONE HCL 5 MG PO TABS
10.0000 mg | ORAL_TABLET | ORAL | Status: DC | PRN
  Administered 2022-08-15: 10 mg via ORAL
  Filled 2022-08-15 (×2): qty 2

## 2022-08-15 MED ORDER — SCOPOLAMINE 1 MG/3DAYS TD PT72
MEDICATED_PATCH | TRANSDERMAL | Status: DC | PRN
  Administered 2022-08-15: 1 via TRANSDERMAL

## 2022-08-15 MED ORDER — SODIUM CHLORIDE 0.9% FLUSH
3.0000 mL | Freq: Two times a day (BID) | INTRAVENOUS | Status: DC
  Administered 2022-08-15: 3 mL via INTRAVENOUS

## 2022-08-15 MED ORDER — THROMBIN 5000 UNITS EX SOLR
OROMUCOSAL | Status: DC | PRN
  Administered 2022-08-15 (×2): 5 mL via TOPICAL

## 2022-08-15 MED ORDER — LIDOCAINE 2% (20 MG/ML) 5 ML SYRINGE
INTRAMUSCULAR | Status: DC | PRN
  Administered 2022-08-15: 100 mg via INTRAVENOUS

## 2022-08-15 MED ORDER — LACTATED RINGERS IV SOLN: INTRAVENOUS | Status: DC | PRN

## 2022-08-15 MED ORDER — GLYCOPYRROLATE PF 0.2 MG/ML IJ SOSY
PREFILLED_SYRINGE | INTRAMUSCULAR | Status: DC | PRN
  Administered 2022-08-15: .2 mg via INTRAVENOUS

## 2022-08-15 MED ORDER — SODIUM CHLORIDE 0.9 % IV SOLN: 250.0000 mL | INTRAVENOUS | Status: DC

## 2022-08-15 MED ORDER — OXYCODONE HCL 5 MG PO TABS
ORAL_TABLET | ORAL | Status: AC
  Filled 2022-08-15: qty 1

## 2022-08-15 MED ORDER — ONDANSETRON HCL 4 MG/2ML IJ SOLN: 4.0000 mg | Freq: Once | INTRAMUSCULAR | Status: DC | PRN

## 2022-08-15 MED ORDER — OXYCODONE HCL 5 MG PO TABS
5.0000 mg | ORAL_TABLET | Freq: Once | ORAL | Status: AC | PRN
  Administered 2022-08-15: 5 mg via ORAL

## 2022-08-15 MED ORDER — BUPIVACAINE-EPINEPHRINE (PF) 0.5% -1:200000 IJ SOLN
INTRAMUSCULAR | Status: DC | PRN
  Administered 2022-08-15: 10 mL via PERINEURAL

## 2022-08-15 MED ORDER — ACETAMINOPHEN 10 MG/ML IV SOLN
INTRAVENOUS | Status: DC | PRN
  Administered 2022-08-15: 1000 mg via INTRAVENOUS

## 2022-08-15 MED ORDER — PHENYLEPHRINE HCL (PRESSORS) 10 MG/ML IV SOLN
INTRAVENOUS | Status: DC | PRN
  Administered 2022-08-15: 80 ug via INTRAVENOUS
  Administered 2022-08-15: 160 ug via INTRAVENOUS

## 2022-08-15 MED ORDER — MENTHOL 3 MG MT LOZG
1.0000 | LOZENGE | OROMUCOSAL | Status: DC | PRN
  Filled 2022-08-15: qty 9

## 2022-08-15 MED ORDER — THROMBIN 5000 UNITS EX SOLR
CUTANEOUS | Status: AC
  Filled 2022-08-15: qty 5000

## 2022-08-15 MED ORDER — ACETAMINOPHEN 650 MG RE SUPP: 650.0000 mg | RECTAL | Status: DC | PRN

## 2022-08-15 MED ORDER — ONDANSETRON HCL 4 MG/2ML IJ SOLN
INTRAMUSCULAR | Status: DC | PRN
  Administered 2022-08-15: 4 mg via INTRAVENOUS

## 2022-08-15 MED ORDER — BUPIVACAINE LIPOSOME 1.3 % IJ SUSP
INTRAMUSCULAR | Status: AC
  Filled 2022-08-15: qty 20

## 2022-08-15 MED ORDER — SODIUM CHLORIDE 0.9% FLUSH: 3.0000 mL | INTRAVENOUS | Status: DC | PRN

## 2022-08-15 MED ORDER — ORAL CARE MOUTH RINSE: 15.0000 mL | Freq: Once | OROMUCOSAL | Status: AC

## 2022-08-15 MED ORDER — 0.9 % SODIUM CHLORIDE (POUR BTL) OPTIME
TOPICAL | Status: DC | PRN
  Administered 2022-08-15: 1000 mL

## 2022-08-15 MED ORDER — LACTATED RINGERS IV SOLN: INTRAVENOUS | Status: DC

## 2022-08-15 MED ORDER — PHENYLEPHRINE HCL-NACL 20-0.9 MG/250ML-% IV SOLN
INTRAVENOUS | Status: DC | PRN
  Administered 2022-08-15: 15 ug/min via INTRAVENOUS

## 2022-08-15 MED ORDER — SUGAMMADEX SODIUM 200 MG/2ML IV SOLN
INTRAVENOUS | Status: DC | PRN
  Administered 2022-08-15: 200 mg via INTRAVENOUS

## 2022-08-15 MED ORDER — HYDROMORPHONE HCL 1 MG/ML IJ SOLN
0.2500 mg | INTRAMUSCULAR | Status: DC | PRN
  Administered 2022-08-15 (×2): 0.5 mg via INTRAVENOUS

## 2022-08-15 MED ORDER — CEFAZOLIN SODIUM-DEXTROSE 2-4 GM/100ML-% IV SOLN
2.0000 g | INTRAVENOUS | Status: AC
  Administered 2022-08-15 (×2): 2 g via INTRAVENOUS
  Filled 2022-08-15: qty 100

## 2022-08-15 MED ORDER — PHENOL 1.4 % MT LIQD: 1.0000 | OROMUCOSAL | Status: DC | PRN

## 2022-08-15 MED ORDER — KETAMINE HCL 10 MG/ML IJ SOLN
INTRAMUSCULAR | Status: DC | PRN
  Administered 2022-08-15: 40 mg via INTRAVENOUS

## 2022-08-15 MED ORDER — BACITRACIN ZINC 500 UNIT/GM EX OINT
TOPICAL_OINTMENT | CUTANEOUS | Status: AC
  Filled 2022-08-15: qty 28.35

## 2022-08-15 MED ORDER — FENTANYL CITRATE (PF) 250 MCG/5ML IJ SOLN
INTRAMUSCULAR | Status: AC
  Filled 2022-08-15: qty 5

## 2022-08-15 MED ORDER — CYCLOBENZAPRINE HCL 10 MG PO TABS
10.0000 mg | ORAL_TABLET | Freq: Three times a day (TID) | ORAL | Status: DC | PRN
  Administered 2022-08-15 – 2022-08-16 (×3): 10 mg via ORAL
  Filled 2022-08-15 (×3): qty 1

## 2022-08-15 MED ORDER — BISACODYL 10 MG RE SUPP: 10.0000 mg | Freq: Every day | RECTAL | Status: DC | PRN

## 2022-08-15 MED ORDER — CHLORHEXIDINE GLUCONATE CLOTH 2 % EX PADS: 6.0000 | MEDICATED_PAD | Freq: Once | CUTANEOUS | Status: DC

## 2022-08-15 MED ORDER — ONDANSETRON HCL 4 MG/2ML IJ SOLN: 4.0000 mg | Freq: Four times a day (QID) | INTRAMUSCULAR | Status: DC | PRN

## 2022-08-15 MED ORDER — MIDAZOLAM HCL 2 MG/2ML IJ SOLN
INTRAMUSCULAR | Status: AC
  Filled 2022-08-15: qty 2

## 2022-08-15 MED ORDER — MIDAZOLAM HCL 5 MG/5ML IJ SOLN
INTRAMUSCULAR | Status: DC | PRN
  Administered 2022-08-15: 2 mg via INTRAVENOUS

## 2022-08-15 MED ORDER — MORPHINE SULFATE (PF) 4 MG/ML IV SOLN
4.0000 mg | INTRAVENOUS | Status: DC | PRN
  Administered 2022-08-16: 4 mg via INTRAVENOUS
  Filled 2022-08-15: qty 1

## 2022-08-15 MED ORDER — SURGIRINSE WOUND IRRIGATION SYSTEM - OPTIME
TOPICAL | Status: DC | PRN
  Administered 2022-08-15: 450 mL via TOPICAL

## 2022-08-15 MED ORDER — OXYCODONE HCL 5 MG/5ML PO SOLN: 5.0000 mg | Freq: Once | ORAL | Status: AC | PRN

## 2022-08-15 MED ORDER — CHLORHEXIDINE GLUCONATE 0.12 % MT SOLN
15.0000 mL | Freq: Once | OROMUCOSAL | Status: AC
  Administered 2022-08-15: 15 mL via OROMUCOSAL
  Filled 2022-08-15: qty 15

## 2022-08-15 MED ORDER — ARTIFICIAL TEARS OPHTHALMIC OINT
TOPICAL_OINTMENT | OPHTHALMIC | Status: DC | PRN
  Administered 2022-08-15: 1 via OPHTHALMIC

## 2022-08-15 MED ORDER — SCOPOLAMINE 1 MG/3DAYS TD PT72
MEDICATED_PATCH | TRANSDERMAL | Status: AC
  Filled 2022-08-15: qty 1

## 2022-08-15 MED ORDER — KETAMINE HCL 50 MG/5ML IJ SOSY
PREFILLED_SYRINGE | INTRAMUSCULAR | Status: AC
  Filled 2022-08-15: qty 5

## 2022-08-15 MED ORDER — OXYCODONE HCL 5 MG PO TABS: 5.0000 mg | ORAL_TABLET | ORAL | Status: DC | PRN

## 2022-08-15 MED ORDER — PROPOFOL 10 MG/ML IV BOLUS
INTRAVENOUS | Status: AC
  Filled 2022-08-15: qty 20

## 2022-08-15 MED ORDER — CEFAZOLIN SODIUM-DEXTROSE 2-4 GM/100ML-% IV SOLN
2.0000 g | Freq: Three times a day (TID) | INTRAVENOUS | Status: AC
  Administered 2022-08-15 – 2022-08-16 (×2): 2 g via INTRAVENOUS
  Filled 2022-08-15 (×2): qty 100

## 2022-08-15 MED ORDER — SUFENTANIL CITRATE 50 MCG/ML IV SOLN
0.3000 ug/kg/h | INTRAVENOUS | Status: AC
  Administered 2022-08-15: .3 ug/kg/h via INTRAVENOUS
  Filled 2022-08-15: qty 1

## 2022-08-15 MED ORDER — ROCURONIUM BROMIDE 10 MG/ML (PF) SYRINGE
PREFILLED_SYRINGE | INTRAVENOUS | Status: DC | PRN
  Administered 2022-08-15: 20 mg via INTRAVENOUS
  Administered 2022-08-15: 30 mg via INTRAVENOUS
  Administered 2022-08-15 (×2): 20 mg via INTRAVENOUS
  Administered 2022-08-15: 70 mg via INTRAVENOUS
  Administered 2022-08-15: 20 mg via INTRAVENOUS

## 2022-08-15 MED ORDER — ACETAMINOPHEN 500 MG PO TABS
1000.0000 mg | ORAL_TABLET | Freq: Four times a day (QID) | ORAL | Status: AC
  Administered 2022-08-15 – 2022-08-16 (×3): 1000 mg via ORAL
  Filled 2022-08-15 (×3): qty 2

## 2022-08-15 MED ORDER — PROPOFOL 10 MG/ML IV BOLUS
INTRAVENOUS | Status: DC | PRN
Start: 2022-08-15 — End: 2022-08-15
  Administered 2022-08-15: 30 ug/kg/min via INTRAVENOUS
  Administered 2022-08-15: 190 mg via INTRAVENOUS

## 2022-08-15 MED ORDER — ROSUVASTATIN CALCIUM 20 MG PO TABS
20.0000 mg | ORAL_TABLET | Freq: Every day | ORAL | Status: DC
  Administered 2022-08-16: 20 mg via ORAL
  Filled 2022-08-15: qty 1

## 2022-08-15 MED ORDER — DEXAMETHASONE SODIUM PHOSPHATE 10 MG/ML IJ SOLN
INTRAMUSCULAR | Status: DC | PRN
  Administered 2022-08-15: 10 mg via INTRAVENOUS

## 2022-08-15 MED ORDER — SENNOSIDES-DOCUSATE SODIUM 8.6-50 MG PO TABS
1.0000 | ORAL_TABLET | Freq: Two times a day (BID) | ORAL | Status: DC
  Administered 2022-08-15 – 2022-08-16 (×2): 1 via ORAL
  Filled 2022-08-15: qty 1

## 2022-08-15 MED ORDER — BUPIVACAINE LIPOSOME 1.3 % IJ SUSP
INTRAMUSCULAR | Status: DC | PRN
  Administered 2022-08-15: 20 mL

## 2022-08-15 MED ORDER — FENTANYL CITRATE (PF) 250 MCG/5ML IJ SOLN
INTRAMUSCULAR | Status: DC | PRN
  Administered 2022-08-15: 50 ug via INTRAVENOUS

## 2022-08-15 MED ORDER — DOCUSATE SODIUM 100 MG PO CAPS
100.0000 mg | ORAL_CAPSULE | Freq: Two times a day (BID) | ORAL | Status: DC
  Administered 2022-08-15 – 2022-08-16 (×2): 100 mg via ORAL
  Filled 2022-08-15 (×2): qty 1

## 2022-08-15 MED ORDER — BACITRACIN ZINC 500 UNIT/GM EX OINT
TOPICAL_OINTMENT | CUTANEOUS | Status: DC | PRN
  Administered 2022-08-15: 1 via TOPICAL

## 2022-08-15 MED ORDER — SODIUM CHLORIDE 0.9 % IV SOLN
INTRAVENOUS | Status: DC | PRN
Start: 2022-08-15 — End: 2022-08-15

## 2022-08-15 SURGICAL SUPPLY — 68 items
APL SKNCLS STERI-STRIP NONHPOA (GAUZE/BANDAGES/DRESSINGS) ×1
BAG COUNTER SPONGE SURGICOUNT (BAG) ×2 IMPLANT
BAG SPNG CNTER NS LX DISP (BAG) ×1
BASKET BONE COLLECTION (BASKET) ×2 IMPLANT
BENZOIN TINCTURE PRP APPL 2/3 (GAUZE/BANDAGES/DRESSINGS) ×2 IMPLANT
BLADE CLIPPER SURG (BLADE) IMPLANT
BUR MATCHSTICK NEURO 3.0 LAGG (BURR) ×2 IMPLANT
BUR PRECISION FLUTE 6.0 (BURR) ×2 IMPLANT
CAGE ALTERA 10X31X9-13 15D (Cage) IMPLANT
CANISTER SUCT 3000ML PPV (MISCELLANEOUS) ×2 IMPLANT
CAP LOCK DLX THRD (Cap) IMPLANT
CNTNR URN SCR LID CUP LEK RST (MISCELLANEOUS) ×2 IMPLANT
CONT SPEC 4OZ STRL OR WHT (MISCELLANEOUS) ×1
COVER BACK TABLE 60X90IN (DRAPES) ×2 IMPLANT
DRAPE C-ARM 42X72 X-RAY (DRAPES) ×4 IMPLANT
DRAPE HALF SHEET 40X57 (DRAPES) ×2 IMPLANT
DRAPE LAPAROTOMY 100X72X124 (DRAPES) ×2 IMPLANT
DRAPE SURG 17X23 STRL (DRAPES) ×8 IMPLANT
DRSG OPSITE POSTOP 4X6 (GAUZE/BANDAGES/DRESSINGS) ×2 IMPLANT
ELECT BLADE 4.0 EZ CLEAN MEGAD (MISCELLANEOUS) ×1
ELECT REM PT RETURN 9FT ADLT (ELECTROSURGICAL) ×1
ELECTRODE BLDE 4.0 EZ CLN MEGD (MISCELLANEOUS) ×2 IMPLANT
ELECTRODE REM PT RTRN 9FT ADLT (ELECTROSURGICAL) ×2 IMPLANT
EVACUATOR 1/8 PVC DRAIN (DRAIN) IMPLANT
GAUZE 4X4 16PLY ~~LOC~~+RFID DBL (SPONGE) ×2 IMPLANT
GLOVE BIO SURGEON STRL SZ 6 (GLOVE) ×2 IMPLANT
GLOVE BIO SURGEON STRL SZ8 (GLOVE) ×4 IMPLANT
GLOVE BIO SURGEON STRL SZ8.5 (GLOVE) ×4 IMPLANT
GLOVE BIOGEL PI IND STRL 6.5 (GLOVE) ×2 IMPLANT
GLOVE BIOGEL PI IND STRL 7.5 (GLOVE) IMPLANT
GLOVE EXAM NITRILE XL STR (GLOVE) IMPLANT
GOWN STRL REUS W/ TWL LRG LVL3 (GOWN DISPOSABLE) ×2 IMPLANT
GOWN STRL REUS W/ TWL XL LVL3 (GOWN DISPOSABLE) ×4 IMPLANT
GOWN STRL REUS W/TWL 2XL LVL3 (GOWN DISPOSABLE) IMPLANT
GOWN STRL REUS W/TWL LRG LVL3 (GOWN DISPOSABLE) ×2
GOWN STRL REUS W/TWL XL LVL3 (GOWN DISPOSABLE) ×2
HEMOSTAT POWDER KIT SURGIFOAM (HEMOSTASIS) ×2 IMPLANT
KIT BASIN OR (CUSTOM PROCEDURE TRAY) ×2 IMPLANT
KIT GRAFTMAG DEL NEURO DISP (NEUROSURGERY SUPPLIES) IMPLANT
KIT POSITION SURG JACKSON T1 (MISCELLANEOUS) ×2 IMPLANT
KIT TURNOVER KIT B (KITS) ×2 IMPLANT
NDL HYPO 21X1.5 SAFETY (NEEDLE) IMPLANT
NDL HYPO 22X1.5 SAFETY MO (MISCELLANEOUS) ×2 IMPLANT
NEEDLE HYPO 21X1.5 SAFETY (NEEDLE) ×1 IMPLANT
NEEDLE HYPO 22X1.5 SAFETY MO (MISCELLANEOUS) ×1 IMPLANT
NS IRRIG 1000ML POUR BTL (IV SOLUTION) ×2 IMPLANT
PACK LAMINECTOMY NEURO (CUSTOM PROCEDURE TRAY) ×2 IMPLANT
PAD ARMBOARD 7.5X6 YLW CONV (MISCELLANEOUS) ×6 IMPLANT
PATTIES SURGICAL .5 X1 (DISPOSABLE) IMPLANT
PATTIES SURGICAL 1X1 (DISPOSABLE) IMPLANT
PUTTY DBM 10CC CALC GRAN (Putty) IMPLANT
ROD CREO CRVD 6.35X100 (Rod) IMPLANT
SCREW PA DLX CREO 7.5X50 (Screw) IMPLANT
SLEEVE SURGEON STRL (DRAPES) IMPLANT
SOLUTION IRRIG SURGIPHOR (IV SOLUTION) ×2 IMPLANT
SPIKE FLUID TRANSFER (MISCELLANEOUS) ×2 IMPLANT
SPONGE NEURO XRAY DETECT 1X3 (DISPOSABLE) IMPLANT
SPONGE SURGIFOAM ABS GEL 100 (HEMOSTASIS) IMPLANT
SPONGE T-LAP 4X18 ~~LOC~~+RFID (SPONGE) IMPLANT
STRIP CLOSURE SKIN 1/2X4 (GAUZE/BANDAGES/DRESSINGS) ×2 IMPLANT
SUT VIC AB 1 CT1 18XBRD ANBCTR (SUTURE) ×4 IMPLANT
SUT VIC AB 1 CT1 8-18 (SUTURE) ×2
SUT VIC AB 2-0 CP2 18 (SUTURE) ×4 IMPLANT
SYR 20ML LL LF (SYRINGE) IMPLANT
TOWEL GREEN STERILE (TOWEL DISPOSABLE) ×2 IMPLANT
TOWEL GREEN STERILE FF (TOWEL DISPOSABLE) ×2 IMPLANT
TRAY FOLEY MTR SLVR 16FR STAT (SET/KITS/TRAYS/PACK) ×2 IMPLANT
WATER STERILE IRR 1000ML POUR (IV SOLUTION) ×2 IMPLANT

## 2022-08-15 NOTE — H&P (Signed)
Subjective: The patient is a 62 year old white male on whom I previous performed an L4-5 fusion.  He is developed recurrent back and bilateral leg pain consistent with neurogenic claudication.  He has failed medical management and was worked up with a lumbar MRI and lumbar x-rays which demonstrated spinal stenosis at L2-3 and L3-4.  I discussed the various treatment options with him.  He has decided proceed with surgery.  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 Pt takes pletal   PONV (postoperative nausea and vomiting)    Sleep apnea    had test-no dr told him he needed cpap   TMJ syndrome    Vitamin D deficiency 11/21/2016    Past Surgical History:  Procedure Laterality Date   COLONOSCOPY     ELBOW ARTHROPLASTY  1996   right   HARDWARE REMOVAL  1997   rt wrist   ORIF WRIST FRACTURE  1997   right   ROTATOR CUFF REPAIR Right    January 2021   SHOULDER ARTHROSCOPY  1996   right   SHOULDER ARTHROSCOPY WITH ROTATOR CUFF REPAIR AND SUBACROMIAL DECOMPRESSION Left 10/09/2012   Procedure: LEFT SHOULDER ARTHROSCOPY WITH SUBACROMIAL DECOMPRESSION, DISTAL CLAVICLE EXCISION DEBRIDEMENT AND ROTATOR CUFF REPAIR;  Surgeon: Eulas Post, MD;  Location: [facility];  Service: Orthopedics;  Laterality: Left;   VASECTOMY      No Known Allergies  Social History   Tobacco Use   Smoking status: Former    Current packs/day: 0.00    Average packs/day: 0.5 packs/day for 6.0 years (3.0 ttl pk-yrs)    Types: Cigarettes    Start date: 10/02/1976    Quit date: 10/03/1982    Years since quitting: 39.8   Smokeless tobacco: Never  Substance Use Topics    Alcohol use: Yes    Comment: rarely    Family History  Problem Relation Age of Onset   Alcohol abuse Mother    Hypertension Mother    Alcohol abuse Other        FAMILY HISTORY   Arthritis Other        FAMILY HISTORY   Stroke Other    Pancreatic cancer Brother    Liver cancer Brother    Prior to Admission medications   Medication Sig Start Date End Date Taking? Authorizing Provider  cholecalciferol (VITAMIN D3) 25 MCG (1000 UNIT) tablet Take 1,000 Units by mouth daily.   Yes [provider]  cilostazol (PLETAL) 100 MG tablet TAKE 1 TABLET BY MOUTH TWICE A DAY 05/15/22  Yes Everrett Coombe, DO  diazepam (VALIUM) 10 MG tablet Take 20 mg by mouth daily. 12/22/21  Yes [provider]  etodolac (LODINE XL) 600 MG 24 hr tablet Take 1 tablet (600 mg total) by mouth 2 (two) times daily. 02/07/22  Yes Monica Becton, MD  omeprazole (PRILOSEC) 20 MG capsule TAKE 1 CAPSULE BY MOUTH EVERY DAY 02/12/22  Yes Everrett Coombe, DO  rosuvastatin (CRESTOR) 20 MG tablet TAKE 1 TABLET BY MOUTH EVERY DAY 05/21/22  Yes Everrett Coombe, DO  Misc. Devices MISC Oral Sleep Device to  be used every night during sleep. 10/03/14   Newt Lukes, MD  valACYclovir (VALTREX) 1000 MG tablet TAKE 1 TABLET BY MOUTH TWICE A DAY Patient taking differently: Take 1,000 mg by mouth 2 (two) times daily as needed (fever blisters). 05/21/22   Everrett Coombe, DO     Review of Systems  Positive ROS: As above  All other systems have been reviewed and were otherwise negative with the exception of those mentioned in the HPI and as above.  Objective: Vital signs in last 24 hours: Temp:  [98.2 F (36.8 C)] 98.2 F (36.8 C) (08/01 0610) Pulse Rate:  [67] 67 (08/01 0610) Resp:  [18] 18 (08/01 0610) BP: (122)/(73) 122/73 (08/01 0610) SpO2:  [94 %] 94 % (08/01 0610) Weight:  [88.5 kg] 88.5 kg (08/01 0610) Estimated body mass index is 27.98 kg/m as calculated from the following:   Height as of this encounter:  5\' 10"  (1.778 m).   Weight as of this encounter: 88.5 kg.   General Appearance: Alert Head: Normocephalic, without obvious abnormality, atraumatic Eyes: PERRL, conjunctiva/corneas clear, EOM's intact,    Ears: Normal  Throat: Normal  Neck: Supple, Back: His lumbar incision is well-healed. Lungs: Clear to auscultation bilaterally, respirations unlabored Heart: Regular rate and rhythm, no murmur, rub or gallop Abdomen: Soft, non-tender Extremities: Extremities normal, atraumatic, no cyanosis or edema Skin: unremarkable  NEUROLOGIC:   Mental status: alert and oriented,Motor Exam - grossly normal Sensory Exam - grossly normal Reflexes:  Coordination - grossly normal Gait - grossly normal Balance - grossly normal Cranial Nerves: I: smell Not tested  II: visual acuity  OS: Normal  OD: Normal   II: visual fields Full to confrontation  II: pupils Equal, round, reactive to light  III,VII: ptosis None  III,IV,VI: extraocular muscles  Full ROM  V: mastication Normal  V: facial light touch sensation  Normal  V,VII: corneal reflex  Present  VII: facial muscle function - upper  Normal  VII: facial muscle function - lower Normal  VIII: hearing Not tested  IX: soft palate elevation  Normal  IX,X: gag reflex Present  XI: trapezius strength  5/5  XI: sternocleidomastoid strength 5/5  XI: neck flexion strength  5/5  XII: tongue strength  Normal    Data Review Lab Results  Component Value Date   WBC 8.9 08/02/2022   HGB 15.4 08/02/2022   HCT 46.7 08/02/2022   MCV 92 08/02/2022   PLT 277 08/02/2022   Lab Results  Component Value Date   NA 142 08/02/2022   K 4.7 08/02/2022   CL 104 08/02/2022   CO2 24 08/02/2022   BUN 18 08/02/2022   CREATININE 1.16 08/02/2022   GLUCOSE 82 08/02/2022   No results found for: "INR", "PROTIME"  Assessment/Plan: L2-3 and L3-4 spinal stenosis, neurogenic claudication, lumbar radiculopathy, lumbago: I have discussed situation with the patient  and his wife.  I reviewed his imaging studies with him and pointed out the abnormalities.  We have discussed the various treatment options including surgery.  I have described the surgical treatment option of an L2-3 and L3-4 decompression, instrumentation and fusion with exploration of his fusion L4-5.  I have shown him surgical models.  I have given him a surgical pamphlet.  We have discussed the risk, benefits, alternatives, expected postoperative course, and likelihood of achieving our goals with surgery.  I have answered all his questions.  He has decided proceed with surgery.   Cristi Loron 08/15/2022 7:20 AM

## 2022-08-15 NOTE — Anesthesia Procedure Notes (Addendum)
Procedure Name: Intubation Date/Time: 08/15/2022 7:34 AM  Performed by: Cy Blamer, CRNAPre-anesthesia Checklist: Patient identified, Emergency Drugs available, Suction available and Patient being monitored Patient Re-evaluated:Patient Re-evaluated prior to induction Oxygen Delivery Method: Circle system utilized Preoxygenation: Pre-oxygenation with 100% oxygen Induction Type: IV induction Ventilation: Mask ventilation without difficulty Laryngoscope Size: Glidescope and 4 Grade View: Grade I Tube type: Oral Tube size: 7.5 mm Number of attempts: 1 Airway Equipment and Method: Stylet and Oral airway Placement Confirmation: ETT inserted through vocal cords under direct vision, positive ETCO2 and breath sounds checked- equal and bilateral Secured at: 23 cm Tube secured with: Tape Dental Injury: Teeth and Oropharynx as per pre-operative assessment  Comments: Glidescope used d/t pt complaints of previous sore throat and Grade III view on previous DL.

## 2022-08-15 NOTE — Progress Notes (Signed)
Orthopedic Tech Progress Note Patient Details:  Benjamin Warren 03-19-1960 [id number]  Ortho Devices Type of Ortho Device: Lumbar corsett Ortho Device/Splint Location: 3C nurse's station Ortho Device/Splint Interventions: Ordered      Docia Furl 08/15/2022, 4:49 PM

## 2022-08-15 NOTE — Transfer of Care (Signed)
Immediate Anesthesia Transfer of Care Note  Patient: Benjamin Warren  Procedure(s) Performed: Posterior Lumbar Interbody Fusion, Interbody Prothesis, POSTERIOR INSTRUMENTATION Lumbar two-three, Lumbar three-four; Explore Fusion  Patient Location: PACU  Anesthesia Type:General  Level of Consciousness: awake, alert , and oriented  Airway & Oxygen Therapy: Patient Spontanous Breathing and Patient connected to face mask oxygen  Post-op Assessment: Report given to RN, Post -op Vital signs reviewed and stable, Patient moving all extremities X 4, and Patient able to stick tongue midline  Post vital signs: Reviewed and stable  Last Vitals:  Vitals Value Taken Time  BP 125/75 08/15/22 1232  Temp 97.8   Pulse 79 08/15/22 1235  Resp 18 08/15/22 1235  SpO2 92 % 08/15/22 1235  Vitals shown include unfiled device data.  Last Pain:  Vitals:   08/15/22 0629  TempSrc:   PainSc: 2       Patients Stated Pain Goal: 0 (08/15/22 0629)  Complications: No notable events documented.

## 2022-08-15 NOTE — Anesthesia Postprocedure Evaluation (Signed)
Anesthesia Post Note  Patient: Benjamin Warren  Procedure(s) Performed: Posterior Lumbar Interbody Fusion, Interbody Prothesis, POSTERIOR INSTRUMENTATION Lumbar two-three, Lumbar three-four; Explore Fusion     Patient location during evaluation: PACU Anesthesia Type: General Level of consciousness: awake and alert Pain management: pain level controlled Vital Signs Assessment: post-procedure vital signs reviewed and stable Respiratory status: spontaneous breathing, nonlabored ventilation, respiratory function stable and patient connected to nasal cannula oxygen Cardiovascular status: blood pressure returned to baseline and stable Postop Assessment: no apparent nausea or vomiting Anesthetic complications: no  No notable events documented.  Last Vitals:  Vitals:   08/15/22 1315 08/15/22 1343  BP: 120/62 108/68  Pulse: 61 61  Resp: 12 18  Temp:  36.6 C  SpO2: 94% 98%    Last Pain:  Vitals:   08/15/22 1343  TempSrc: Oral  PainSc:                  [name] S

## 2022-08-15 NOTE — Op Note (Signed)
Brief history: The patient is a 62 year old white male on whom I previous performed an L4-5 fusion.  He initially did well but has developed recurrent back and bilateral leg pain consistent with neurogenic claudication/lumbar radiculopathy.  He failed medical management and was worked up with lumbar x-rays, lumbar MRI and lumbar myelo CT which demonstrated spondylosis, stenosis, etc. at L2-3 and L3-4.  I discussed the various treatment options with him.  He has decided proceed with surgery.  Preoperative diagnosis: L2-3 and L3-4 degenerative disc disease, spinal stenosis compressing the L2, L3 and L4 nerve roots; lumbago; lumbar radiculopathy; neurogenic claudication  Postoperative diagnosis: The same  Procedure: Bilateral L2-3 and L3-4 laminotomy/foraminotomies/medial facetectomy to decompress the bilateral L2, L3 and L4 nerve roots(the work required to do this was in addition to the work required to do the posterior lumbar interbody fusion because of the patient's spinal stenosis, facet arthropathy. Etc. requiring a wide decompression of the nerve roots.);  Left L2-3 and L3-4 transforaminal lumbar interbody fusion with local morselized autograft bone and Zimmer DBM; insertion of interbody prosthesis at L2-3 and L3-4 (globus peek expandable interbody prosthesis); posterior segmental instrumentation from L2 to L5 with globus titanium pedicle screws and rods; posterior lateral arthrodesis at L2-3 and L3-4 with local morselized autograft bone and Zimmer DBM; exploration of lumbar fusion/removal of lumbar hardware  Surgeon: Dr. Delma Officer  Asst.: Hildred Priest, NP  Anesthesia: Gen. endotracheal  Estimated blood loss: 250 cc  Drains: 1 medium Hemovac in the epidural space  Complications: None  Description of procedure: The patient was brought to the operating room by the anesthesia team. General endotracheal anesthesia was induced. The patient was turned to the prone position on the Wilson frame.  The patient's lumbosacral region was then prepared with Betadine scrub and Betadine solution. Sterile drapes were applied.  I then injected the area to be incised with Marcaine with epinephrine solution. I then used the scalpel to make a linear midline incision over the L2-3, L3-4 and L4-5 interspace, incising through the old surgical scar. I then used electrocautery to perform a bilateral subperiosteal dissection exposing the spinous process and lamina of L2-3, L3-4 and L4-5 and to expose the old hardware at L4-5.  We then inserted the Verstrac retractor to provide exposure.  We explored the fusion by removing the caps from the old screws and remove the rods.  I inspected the arthrodesis at L4-5.  It appeared solid.  I began the decompression by using the high speed drill to perform laminotomies at L2-3 and L3-4 bilaterally. We then used the Kerrison punches to widen the laminotomy and removed the ligamentum flavum at L2-3 and L3-4 bilaterally. We used the Kerrison punches to remove the medial facets at L2-3 and L3-4 bilaterally, we removed the left L2-3 and L3-4 facet. We performed wide foraminotomies about the bilateral L2, L3 and L4 nerve roots completing the decompression.  We now turned our attention to the posterior lumbar interbody fusion. I used a scalpel to incise the intervertebral disc at L2-3 and L3-4 bilaterally. I then performed a partial intervertebral discectomy at L2-3 and L3-4 bilaterally using the pituitary forceps. We prepared the vertebral endplates at L2-3 and L3-4 bilaterally for the fusion by removing the soft tissues with the curettes. We then used the trial spacers to pick the appropriate sized interbody prosthesis. We prefilled his prosthesis with a combination of local morselized autograft bone that we obtained during the decompression as well as Zimmer DBM. We inserted the prefilled prosthesis into the  interspace at L2-3 and L3-4 from the left, we then turned and expanded the  prosthesis. There was a good snug fit of the prosthesis in the interspace. We then filled and the remainder of the intervertebral disc space with local morselized autograft bone and Zimmer DBM. This completed the posterior lumbar interbody arthrodesis.  During the decompression and insertion of the prosthesis the assistant protected the thecal sac and nerve roots with the D'Errico retractor.  We now turned attention to the instrumentation. Under fluoroscopic guidance we cannulated the bilateral L2 and L3 pedicles with the bone probe. We then removed the bone probe. We then tapped the pedicle with a 6.5 millimeter tap. We then removed the tap. We probed inside the tapped pedicle with a ball probe to rule out cortical breaches. We then inserted a 7.5 x 50 millimeter pedicle screw into the L2 and L3 pedicles bilaterally under fluoroscopic guidance. We then palpated along the medial aspect of the pedicles to rule out cortical breaches. There were none. The nerve roots were not injured. We then connected the unilateral pedicle screws with a lordotic rod. We compressed the construct and secured the rod in place with the caps. We then tightened the caps appropriately. This completed the instrumentation from L2 to L5 bilaterally.  We now turned our attention to the posterior lateral arthrodesis at L2-3 and L3-4. We used the high-speed drill to decorticate the remainder of the facets, pars, transverse process at L2-3 and L3-4. We then applied a combination of local morselized autograft bone and Zimmer DBM over these decorticated posterior lateral structures. This completed the posterior lateral arthrodesis.  We then obtained hemostasis using bipolar electrocautery. We irrigated the wound out with Betadine solution. We inspected the thecal sac and nerve roots and noted they were well decompressed. We then removed the retractor.  We injected Exparel . We reapproximated patient's thoracolumbar fascia with interrupted #1  Vicryl suture. We reapproximated patient's subcutaneous tissue with interrupted 2-0 Vicryl suture. The reapproximated patient's skin with Steri-Strips and benzoin. The wound was then coated with bacitracin ointment. A sterile dressing was applied. The drapes were removed. The patient was subsequently returned to the supine position where they were extubated by the anesthesia team. He was then transported to the post anesthesia care unit in stable condition. All sponge instrument and needle counts were reportedly correct at the end of this case.

## 2022-08-16 MED ORDER — DOCUSATE SODIUM 100 MG PO CAPS: 100.0000 mg | ORAL_CAPSULE | Freq: Two times a day (BID) | ORAL | 0 refills | Status: DC

## 2022-08-16 MED ORDER — OXYCODONE-ACETAMINOPHEN 5-325 MG PO TABS: 1.0000 | ORAL_TABLET | ORAL | Status: DC | PRN

## 2022-08-16 MED ORDER — OXYCODONE-ACETAMINOPHEN 5-325 MG PO TABS: 1.0000 | ORAL_TABLET | ORAL | 0 refills | Status: DC | PRN

## 2022-08-16 MED ORDER — ACETAMINOPHEN 500 MG PO TABS: 1000.0000 mg | ORAL_TABLET | ORAL | Status: DC | PRN

## 2022-08-16 MED ORDER — ACETAMINOPHEN 500 MG PO TABS
1000.0000 mg | ORAL_TABLET | Freq: Four times a day (QID) | ORAL | Status: DC | PRN
  Administered 2022-08-16: 1000 mg via ORAL
  Filled 2022-08-16: qty 2

## 2022-08-16 MED FILL — Thrombin For Soln 5000 Unit: CUTANEOUS | Qty: 5000 | Status: AC

## 2022-08-16 NOTE — Evaluation (Addendum)
Occupational Therapy Evaluation Patient Details Name: Benjamin Warren MRN: [id number] DOB: 02-28-1960 Today's Date: 08/16/2022   History of Present Illness 62 yo male s/p 8/1 PLIF L2-4 PMH anxiety, arthritis, chronic pain syndrome, depression, fibromyalgia, L rotator cuff tear, PVD R elbow arthoplasiy, R ORIF wrist,   Clinical Impression   Patient is s/p PLIF L2-4 surgery resulting in functional limitations due to the deficits listed below (see OT problem list). Pt at baseline indep with all dressing and bathing. Pt at this time reports pain at incision site. Pt educated on hand safety with RW.  Patient will benefit from skilled OT acutely to increase independence and safety with ADLS to allow discharge home with family (A) .       Recommendations for follow up therapy are one component of a multi-disciplinary discharge planning process, led by the attending physician.  Recommendations may be updated based on patient status, additional functional criteria and insurance authorization.   Assistance Recommended at Discharge PRN  Patient can return home with the following A little help with walking and/or transfers;Assist for transportation    Functional Status Assessment  Patient has had a recent decline in their functional status and demonstrates the ability to make significant improvements in function in a reasonable and predictable amount of time.  Equipment Recommendations  Other (comment) (RW)    Recommendations for Other Services       Precautions / Restrictions Precautions Precautions: Back Precaution Comments: handout provided for back precautions and reviewed for adls Required Braces or Orthoses: Spinal Brace Spinal Brace: Lumbar corset;Applied in standing position      Mobility Bed Mobility Overal bed mobility: Modified Independent             General bed mobility comments: cues for sequence    Transfers Overall transfer level: Needs assistance Equipment used:  Rolling walker (2 wheels) Transfers: Sit to/from Stand Sit to Stand: Supervision           General transfer comment: cues for hand placement and avoid pulling on the RW. pt when pulling on the RW it rocks backward with pt having posterior lean bias  Back handout provided and reviewed adls in detail. Pt educated on: clothing between brace, never sleep in brace, set an alarm at night for medication, avoid sitting for long periods of time, correct bed positioning for sleeping, correct sequence for bed mobility, avoiding lifting more than 5 pounds and never wash directly over incision. All education is complete and patient indicates understanding.'     Balance Overall balance assessment: Mild deficits observed, not formally tested Sitting-balance support: No upper extremity supported, Feet supported Sitting balance-Leahy Scale: Good     Standing balance support: Bilateral upper extremity supported, During functional activity, Reliant on assistive device for balance Standing balance-Leahy Scale: Poor                             ADL either performed or assessed with clinical judgement   ADL Overall ADL's : Modified independent                                       General ADL Comments: pt able to don underwear and keep back precuations. pt able to     Vision Baseline Vision/History: 1 Wears glasses Ability to See in Adequate Light: 0 Adequate Patient Visual Report: No change from baseline  Vision Assessment?: No apparent visual deficits     Perception     Praxis      Pertinent Vitals/Pain Pain Assessment Pain Assessment: Faces Faces Pain Scale: Hurts even more Pain Location: back Pain Descriptors / Indicators: Discomfort, Grimacing, Guarding, Operative site guarding Pain Intervention(s): Monitored during session, Premedicated before session, Repositioned     Hand Dominance Right   Extremity/Trunk Assessment Upper Extremity Assessment Upper  Extremity Assessment: Overall WFL for tasks assessed (pt at baseline with grasp changes and gets routine shots in his hands by orthopedic doctor.)   Lower Extremity Assessment Lower Extremity Assessment: Defer to PT evaluation   Cervical / Trunk Assessment Cervical / Trunk Assessment: Back Surgery   Communication Communication Communication: No difficulties   Cognition Arousal/Alertness: Awake/alert Behavior During Therapy: WFL for tasks assessed/performed Overall Cognitive Status: Within Functional Limits for tasks assessed                                 General Comments: pt reports "i feel hungover because of the medicaitons but I dont feel any pain relief"     General Comments  incision is covered and dry at this time.    Exercises     Shoulder Instructions      Home Living Family/patient expects to be discharged to:: Private residence Living Arrangements: Spouse/significant other Available Help at Discharge: Family;Available 24 hours/day Type of Home: House Home Access: Stairs to enter [employer] of Steps: 2   Home Layout: One level     Bathroom Shower/Tub: Producer, television/film/video: Handicapped height     Home Equipment: None   Additional Comments: 2 dogs Momo / kiki and 2 cats TomTom and Charlie- Pt does let the dogs out daily and they use tools to manage animal waste. pt advised against bending over for animal waste at this time. Daughter resides in the house currently to be of assistance as well.      Prior Functioning/Environment Prior Level of Function : Independent/Modified Independent             Mobility Comments: no dme ADLs Comments: enjoys playing guitar, working on cars with friends        OT Problem List: Decreased strength;Impaired balance (sitting and/or standing);Decreased activity tolerance;Decreased safety awareness;Decreased knowledge of use of DME or AE;Decreased knowledge of precautions;Pain       OT Treatment/Interventions: Self-care/ADL training;Energy conservation;DME and/or AE instruction;Therapeutic activities;Balance training;Patient/family education;Manual therapy;Modalities;Therapeutic exercise    OT Goals(Current goals can be found in the care plan section) Acute Rehab OT Goals Patient Stated Goal: to be able to playing his music and workign with his buddy on cars OT Goal Formulation: With patient Potential to Achieve Goals: Good  OT Frequency: Min 1X/week    Co-evaluation              AM-PAC OT "6 Clicks" Daily Activity     Outcome Measure Help from another person eating meals?: None Help from another person taking care of personal grooming?: None Help from another person toileting, which includes using toliet, bedpan, or urinal?: A Little Help from another person bathing (including washing, rinsing, drying)?: A Little Help from another person to put on and taking off regular upper body clothing?: None Help from another person to put on and taking off regular lower body clothing?: A Little 6 Click Score: 21   End of Session Equipment Utilized During Treatment: Rolling walker (2 wheels);Back  brace Nurse Communication: Mobility status;Precautions  Activity Tolerance: Patient tolerated treatment well Patient left: in bed;Other (comment) (sitting EOB with PT arriving)  OT Visit Diagnosis: Unsteadiness on feet (R26.81);Muscle weakness (generalized) (M62.81)                Time: 4098-1191 OT Time Calculation (min): 57 min Charges:  OT General Charges $OT Visit: 1 Visit OT Evaluation $OT Eval Moderate Complexity: 1 Mod OT Treatments $Self Care/Home Management : 23-37 mins   Brynn, OTR/L  Acute Rehabilitation Services Office: [phone] .   Mateo Flow 08/16/2022, 10:16 AM

## 2022-08-16 NOTE — Discharge Instructions (Signed)

## 2022-08-16 NOTE — Progress Notes (Signed)
Patient alert and oriented, voiding adequately, skin clean, dry and intact without evidence of skin break down, or symptoms of complications - no redness or edema noted, only slight tenderness at site.  Patient states pain is manageable at time of discharge. Patient and spouse stated understanding of instructions given. Patient has an appointment with MD in 3 weeks

## 2022-08-16 NOTE — Progress Notes (Signed)
Subjective: The patient is alert and pleasant.  He is in no apparent distress.  He complains of back pain.  His wife is at the bedside.  Objective: Vital signs in last 24 hours: Temp:  [97.8 F (36.6 C)-99.8 F (37.7 C)] 99.8 F (37.7 C) (08/02 0348) Pulse Rate:  [61-80] 80 (08/02 0348) Resp:  [10-18] 18 (08/02 0348) BP: (108-138)/(61-75) 137/61 (08/02 0348) SpO2:  [94 %-99 %] 95 % (08/02 0348) Estimated body mass index is 27.98 kg/m as calculated from the following:   Height as of this encounter: 5\' 10"  (1.778 m).   Weight as of this encounter: 88.5 kg.   Intake/Output from previous day: 08/01 0701 - 08/02 0700 In: 1852 [I.V.:1432; Blood:220; IV Piggyback:200] Out: 1155 [Urine:305; Drains:200; Blood:650] Intake/Output this shift: Total I/O In: -  Out: 50 [Drains:50]  Physical exam the patient is alert and oriented.  His dressing is clean and dry.  His lower extremity strength is normal.  Lab Results: No results for input(s): "WBC", "HGB", "HCT", "PLT" in the last 72 hours. BMET No results for input(s): "NA", "K", "CL", "CO2", "GLUCOSE", "BUN", "CREATININE", "CALCIUM" in the last 72 hours.  Studies/Results: DG Lumbar Spine 2-3 Views  Result Date: 08/15/2022 CLINICAL DATA:  [id number] Elective surgery [id number] EXAM: LUMBAR SPINE - 2-3 VIEW COMPARISON:  June 26, 2022 FINDINGS: Spot fluoroscopy images were obtained for surgical planning purposes. This demonstrates placement of posterior fixation surgical hardware and intervertebral spacer at the levels above previously placed hardware,. This is presumably at L2 and 3 with an additional intervertebral spacer at L3-4. Time: 6.2 seconds Dose: 4.2 mGy Please reference procedure report for further details. IMPRESSION: Spot fluoroscopy images for surgical planning purposes. Electronically Signed   By: Meda Klinefelter M.D.   On: 08/15/2022 14:44   DG C-Arm 1-60 Min-No Report  Result Date: 08/15/2022 Fluoroscopy was utilized by the  requesting physician.  No radiographic interpretation.    Assessment/Plan: Postop day #1: We will mobilize the patient with PT.  He may go home later on today or tomorrow.  I gave him his discharge instructions and answered all their questions.  LOS: 1 day     Cristi Loron 08/16/2022, 6:43 AM     Patient ID: Benjamin Warren, male   DOB: 20-Jan-1960, 62 y.o.   MRN: [id number]

## 2022-08-16 NOTE — Evaluation (Signed)
Physical Therapy Evaluation  Patient Details Name: Benjamin Warren MRN: [id number] DOB: 1960-11-09 Today's Date: 08/16/2022  History of Present Illness  Pt is a 62 y/o male who presents s/p  L2-4 PLIF on 08/15/22. PMH anxiety, arthritis, chronic pain syndrome, depression, fibromyalgia, L rotator cuff tear, PVD R elbow arthoplasty, R ORIF wrist,  Clinical Impression  Pt admitted with above diagnosis. At the time of PT eval, pt was able to demonstrate transfers and ambulation with gross min guard assist for balance support and safety. Pt moving slow and guarded throughout session due to pain. Pt was educated on precautions, brace application/wearing schedule, appropriate activity progression, and car transfer. Pt currently with functional limitations due to the deficits listed below (see PT Problem List). Pt will benefit from skilled PT to increase their independence and safety with mobility to allow discharge to the venue listed below.          If plan is discharge home, recommend the following: A little help with walking and/or transfers;A little help with bathing/dressing/bathroom;Assist for transportation;Help with stairs or ramp for entrance   Can travel by private vehicle        Equipment Recommendations Rolling walker (2 wheels)  Recommendations for Other Services       Functional Status Assessment Patient has had a recent decline in their functional status and demonstrates the ability to make significant improvements in function in a reasonable and predictable amount of time.     Precautions / Restrictions Precautions Precautions: Back;Fall Precaution Booklet Issued: Yes (comment) Precaution Comments: Reviewed handout and pt was cued for precautions during functional mobility. Required Braces or Orthoses: Spinal Brace Spinal Brace: Lumbar corset;Applied in standing position Restrictions Weight Bearing Restrictions: No      Mobility  Bed Mobility Overal bed mobility: Modified  Independent             General bed mobility comments: HOB flat and rails lowered to simulate home environment. No assist required. VC's for optimal    Transfers Overall transfer level: Needs assistance Equipment used: Rolling walker (2 wheels) Transfers: Sit to/from Stand Sit to Stand: Min guard           General transfer comment: VC's for hand placement on seated surface for safety. No assist required but close guard provided due to posterior LOB when pulling up from walker.    Ambulation/Gait Ambulation/Gait assistance: Min guard Gait Distance (Feet): 300 Feet Assistive device: Rolling walker (2 wheels) Gait Pattern/deviations: Step-through pattern, Decreased stride length, Trunk flexed, Narrow base of support Gait velocity: Decreased Gait velocity interpretation: <1.31 ft/sec, indicative of household ambulator   General Gait Details: VC's for improved posture, closer walker proximity and forward gaze. Overall pt moving slow and guarded due to pain with increased WB through UE's to off-weight back.  Stairs Stairs: Yes Stairs assistance: Min guard Stair Management: Two rails, Step to pattern, Forwards Number of Stairs: 2 General stair comments: VC's for sequencing and general safety.  Wheelchair Mobility     Tilt Bed    Modified Rankin (Stroke Patients Only)       Balance Overall balance assessment: Mild deficits observed, not formally tested Sitting-balance support: No upper extremity supported, Feet supported Sitting balance-Leahy Scale: Good     Standing balance support: Bilateral upper extremity supported, During functional activity, Reliant on assistive device for balance Standing balance-Leahy Scale: Poor  Pertinent Vitals/Pain Pain Assessment Faces Pain Scale: Hurts even more Pain Location: back Pain Descriptors / Indicators: Discomfort, Grimacing, Guarding, Operative site guarding    Home Living  Family/patient expects to be discharged to:: Private residence Living Arrangements: Spouse/significant other Available Help at Discharge: Family;Available 24 hours/day Type of Home: House Home Access: Stairs to enter   [employer] of Steps: 2   Home Layout: One level Home Equipment: None Additional Comments: 2 dogs Momo / kiki and 2 cats TomTom and Charlie- Pt does let the dogs out daily and they use tools to manage animal waste. pt advised against bending over for animal waste at this time. Daughter resides in the house currently to be of assistance as well.    Prior Function Prior Level of Function : Independent/Modified Independent             Mobility Comments: no dme ADLs Comments: enjoys playing guitar, working on cars with friends     Hand Dominance   Dominant Hand: Right    Extremity/Trunk Assessment   Upper Extremity Assessment Upper Extremity Assessment: Defer to OT evaluation    Lower Extremity Assessment Lower Extremity Assessment: Generalized weakness    Cervical / Trunk Assessment Cervical / Trunk Assessment: Back Surgery  Communication   Communication: No difficulties  Cognition Arousal/Alertness: Awake/alert Behavior During Therapy: WFL for tasks assessed/performed Overall Cognitive Status: Within Functional Limits for tasks assessed                                 General Comments: pt reports "i feel hungover because of the medications but I dont feel any pain relief"        General Comments General comments (skin integrity, edema, etc.): incision is covered and dry at this time.    Exercises     Assessment/Plan    PT Assessment Patient needs continued PT services  PT Problem List Decreased strength;Decreased activity tolerance;Decreased balance;Decreased mobility;Decreased knowledge of use of DME;Decreased safety awareness;Decreased knowledge of precautions;Pain       PT Treatment Interventions DME  instruction;Gait training;Stair training;Functional mobility training;Therapeutic activities;Therapeutic exercise;Balance training;Patient/family education    PT Goals (Current goals can be found in the Care Plan section)  Acute Rehab PT Goals Patient Stated Goal: Home today, pain control PT Goal Formulation: With patient/family Time For Goal Achievement: 08/23/22 Potential to Achieve Goals: Good    Frequency Min 1X/week     Co-evaluation               AM-PAC PT "6 Clicks" Mobility  Outcome Measure Help needed turning from your back to your side while in a flat bed without using bedrails?: A Little Help needed moving from lying on your back to sitting on the side of a flat bed without using bedrails?: A Little Help needed moving to and from a bed to a chair (including a wheelchair)?: A Little Help needed standing up from a chair using your arms (e.g., wheelchair or bedside chair)?: A Little Help needed to walk in hospital room?: A Little Help needed climbing 3-5 steps with a railing? : A Little 6 Click Score: 18    End of Session Equipment Utilized During Treatment: Gait belt Activity Tolerance: Patient limited by pain Patient left: in bed;with call bell/phone within reach;with family/visitor present Nurse Communication: Mobility status PT Visit Diagnosis: Unsteadiness on feet (R26.81);Pain Pain - part of body:  (back)    Time: 0940-1000 PT Time Calculation (  min) (ACUTE ONLY): 20 min   Charges:   PT Evaluation $PT Eval Low Complexity: 1 Low   PT General Charges $$ ACUTE PT VISIT: 1 Visit         Conni Slipper, PT, DPT Acute Rehabilitation Services Secure Chat Preferred Office: [phone]   Marylynn Pearson 08/16/2022, 12:51 PM

## 2022-08-16 NOTE — Discharge Summary (Signed)
Physician Discharge Summary     Providing Compassionate, Quality Care - Together   Patient ID: Benjamin Warren MRN: [id number] DOB/AGE: 1960/05/06 62 y.o.  Admit date: 08/15/2022 Discharge date: 08/16/2022  Admission Diagnoses: Lumbar stenosis with neurogenic claudication  Discharge Diagnoses:  Principal Problem:   Lumbar stenosis with neurogenic claudication   Discharged Condition: good  Hospital Course: Patient underwent extension of his L4-5 fusion to include L2-3 and L3-4 by Dr. Lovell Sheehan on 08/15/2022. He was admitted to 3C09 following recovery from anesthesia in the PACU. His postoperative course has been uncomplicated. He has worked with both physical and occupational therapies who feel the patient is ready for discharge home. He is ambulating independently with the aid of a walker. He is tolerating a normal diet. He is not having any bowel or bladder dysfunction. His pain is well-controlled with oral pain medication. He is ready for discharge home.   Consults: PT/OT/TOC  Significant Diagnostic Studies: radiology: DG Lumbar Spine 2-3 Views  Result Date: 08/15/2022 CLINICAL DATA:  [id number] Elective surgery [id number] EXAM: LUMBAR SPINE - 2-3 VIEW COMPARISON:  June 26, 2022 FINDINGS: Spot fluoroscopy images were obtained for surgical planning purposes. This demonstrates placement of posterior fixation surgical hardware and intervertebral spacer at the levels above previously placed hardware,. This is presumably at L2 and 3 with an additional intervertebral spacer at L3-4. Time: 6.2 seconds Dose: 4.2 mGy Please reference procedure report for further details. IMPRESSION: Spot fluoroscopy images for surgical planning purposes. Electronically Signed   By: Meda Klinefelter M.D.   On: 08/15/2022 14:44   DG C-Arm 1-60 Min-No Report  Result Date: 08/15/2022 Fluoroscopy was utilized by the requesting physician.  No radiographic interpretation.     Treatments: surgery: Bilateral L2-3 and L3-4  laminotomy/foraminotomies/medial facetectomy to decompress the bilateral L2, L3 and L4 nerve roots(the work required to do this was in addition to the work required to do the posterior lumbar interbody fusion because of the patient's spinal stenosis, facet arthropathy. Etc. requiring a wide decompression of the nerve roots.);  Left L2-3 and L3-4 transforaminal lumbar interbody fusion with local morselized autograft bone and Zimmer DBM; insertion of interbody prosthesis at L2-3 and L3-4 (globus peek expandable interbody prosthesis); posterior segmental instrumentation from L2 to L5 with globus titanium pedicle screws and rods; posterior lateral arthrodesis at L2-3 and L3-4 with local morselized autograft bone and Zimmer DBM; exploration of lumbar fusion/removal of lumbar hardware   Discharge Exam: Blood pressure 120/81, pulse 78, temperature 98.5 F (36.9 C), temperature source Oral, resp. rate 20, height 5\' 10"  (1.778 m), weight 88.5 kg, SpO2 95%.  Per report: Alert and oriented x 4 PERRLA CN II-XII grossly intact MAE, Strength and sensation intact Incision is covered with Honeycomb dressing and Steri Strips; Dressing is clean, dry, and intact   Disposition: Discharge disposition: 01-Home or Self Care       Discharge Instructions     Call MD for:  difficulty breathing, headache or visual disturbances   Complete by: As directed    Call MD for:  hives   Complete by: As directed    Call MD for:  persistant nausea and vomiting   Complete by: As directed    Call MD for:  redness, tenderness, or signs of infection (pain, swelling, redness, odor or green/yellow discharge around incision site)   Complete by: As directed    Call MD for:  severe uncontrolled pain   Complete by: As directed    Call MD for:  temperature >100.4  Complete by: As directed    Diet - low sodium heart healthy   Complete by: As directed    If the dressing is still on your incision site when you go home, remove it  on the third day after your surgery date. Remove dressing if it begins to fall off, or if it is dirty or damaged before the third day.   Complete by: As directed    Increase activity slowly   Complete by: As directed       Allergies as of 08/16/2022   No Known Allergies      Medication List     TAKE these medications    cholecalciferol 25 MCG (1000 UNIT) tablet Commonly known as: VITAMIN D3 Take 1,000 Units by mouth daily.   cilostazol 100 MG tablet Commonly known as: PLETAL TAKE 1 TABLET BY MOUTH TWICE A DAY   diazepam 10 MG tablet Commonly known as: VALIUM Take 20 mg by mouth daily.   docusate sodium 100 MG capsule Commonly known as: COLACE Take 1 capsule (100 mg total) by mouth 2 (two) times daily.   etodolac 600 MG 24 hr tablet Commonly known as: LODINE XL Take 1 tablet (600 mg total) by mouth 2 (two) times daily.   Misc. Devices Misc Oral Sleep Device to be used every night during sleep.   omeprazole 20 MG capsule Commonly known as: PRILOSEC TAKE 1 CAPSULE BY MOUTH EVERY DAY   oxyCODONE-acetaminophen 5-325 MG tablet Commonly known as: Percocet Take 1-2 tablets by mouth every 4 (four) hours as needed for severe pain (postoperative pain).   rosuvastatin 20 MG tablet Commonly known as: CRESTOR TAKE 1 TABLET BY MOUTH EVERY DAY   valACYclovir 1000 MG tablet Commonly known as: VALTREX TAKE 1 TABLET BY MOUTH TWICE A DAY What changed:  when to take this reasons to take this               Durable Medical Equipment  (From admission, onward)           Start     Ordered   08/16/22 0947  For home use only DME Walker rolling  Once       Question Answer Comment  Walker: With 5 Inch Wheels   Patient needs a walker to treat with the following condition S/P lumbar spinal fusion      08/16/22 0950              Discharge Care Instructions  (From admission, onward)           Start     Ordered   08/16/22 0000  If the dressing is still on  your incision site when you go home, remove it on the third day after your surgery date. Remove dressing if it begins to fall off, or if it is dirty or damaged before the third day.        08/16/22 1237            Follow-up Information     Tressie Stalker, MD. Go on 09/06/2022.   Specialty: Neurosurgery Why: First post op appointment with x-rays is on 09/06/2022 at 12/45 PM. Contact information: [address] [place] [address] [phone]                 Signed: Val Eagle, DNP, AGNP-C Nurse Practitioner  [facility] [address], [address], [place], [place] [address] P: [phone]    F: [phone]  08/16/2022, 12:37  PM

## 2022-08-18 ENCOUNTER — Other Ambulatory Visit: Payer: Self-pay | Admitting: Family Medicine

## 2022-08-18 DIAGNOSIS — I739 Peripheral vascular disease, unspecified: Secondary | ICD-10-CM

## 2022-08-18 DIAGNOSIS — K219 Gastro-esophageal reflux disease without esophagitis: Secondary | ICD-10-CM

## 2022-08-18 DIAGNOSIS — R252 Cramp and spasm: Secondary | ICD-10-CM

## 2022-08-19 ENCOUNTER — Telehealth: Payer: Self-pay

## 2022-08-19 NOTE — Transitions of Care (Post Inpatient/ED Visit) (Signed)
   08/19/2022  Name: Benjamin Warren MRN: [id number] DOB: Oct 28, 1960  Today's TOC FU Call Status: Today's TOC FU Call Status:: Successful TOC FU Call Completed TOC FU Call Complete Date: 08/19/22  Transition Care Management Follow-up Telephone Call Date of Discharge: 08/16/22 Discharge Facility: [facility]) Type of Discharge: Inpatient Admission Primary Inpatient Discharge Diagnosis:: spinal stenosis How have you been since you were released from the hospital?: Better  Items Reviewed: Did you receive and understand the discharge instructions provided?: No Medications obtained,verified, and reconciled?: Yes (Medications Reviewed) Any new allergies since your discharge?: No Dietary orders reviewed?: Yes Do you have support at home?: Yes People in Home: spouse  Medications Reviewed Today: Medications Reviewed Today     Reviewed by Karena Addison, LPN (Licensed Practical Nurse) on 08/19/22 at 1224  Med List Status: <None>   Medication Order Taking? Sig Documenting Provider Last Dose Status Informant  cholecalciferol (VITAMIN D3) 25 MCG (1000 UNIT) tablet [id number] No Take 1,000 Units by mouth daily. [provider] Past Week Active Self  cilostazol (PLETAL) 100 MG tablet [id number] No TAKE 1 TABLET BY MOUTH TWICE A DAY Everrett Coombe, DO Past Week Active Self  diazepam (VALIUM) 10 MG tablet [id number] No Take 20 mg by mouth daily. [provider] 08/15/2022 Active Self  docusate sodium (COLACE) 100 MG capsule [id number]  Take 1 capsule (100 mg total) by mouth 2 (two) times daily. Val Eagle D, NP  Active   etodolac (LODINE XL) 600 MG 24 hr tablet [id number] No Take 1 tablet (600 mg total) by mouth 2 (two) times daily. Monica Becton, MD Past Week Active Self  Misc. Devices MISC [id number] No Oral Sleep Device to be used every night during sleep. Newt Lukes, MD Taking Active Self  omeprazole (PRILOSEC) 20 MG capsule [id number] No TAKE 1 CAPSULE BY MOUTH  EVERY DAY Everrett Coombe, DO 08/15/2022 0500 Active Self  oxyCODONE-acetaminophen (PERCOCET) 5-325 MG tablet [id number]  Take 1-2 tablets by mouth every 4 (four) hours as needed for severe pain (postoperative pain). Val Eagle D, NP  Active   rosuvastatin (CRESTOR) 20 MG tablet [id number] No TAKE 1 TABLET BY MOUTH EVERY DAY Everrett Coombe, DO 08/15/2022 0500 Active Self  valACYclovir (VALTREX) 1000 MG tablet [id number] No TAKE 1 TABLET BY MOUTH TWICE A DAY  Patient taking differently: Take 1,000 mg by mouth 2 (two) times daily as needed (fever blisters).   Everrett Coombe, DO More than a month Active Self            Home Care and Equipment/Supplies: Were Home Health Services Ordered?: NA Any new equipment or medical supplies ordered?: NA  Functional Questionnaire: Do you need assistance with bathing/showering or dressing?: Yes Do you need assistance with meal preparation?: No Do you need assistance with eating?: No Do you have difficulty maintaining continence: No Do you need assistance with getting out of bed/getting out of a chair/moving?: No Do you have difficulty managing or taking your medications?: No  Follow up appointments reviewed: PCP Follow-up appointment confirmed?: NA Specialist Hospital Follow-up appointment confirmed?: Yes Date of Specialist follow-up appointment?: 09/06/22 Follow-Up Specialty Provider:: neuro Do you need transportation to your follow-up appointment?: No Do you understand care options if your condition(s) worsen?: Yes-patient verbalized understanding    SIGNATURE Karena Addison, LPN [facility] Nurse Health Advisor Direct Dial [phone]

## 2022-09-06 MED FILL — Sodium Chloride Irrigation Soln 0.9%: Qty: 3000 | Status: AC

## 2022-09-06 MED FILL — Sodium Chloride IV Soln 0.9%: INTRAVENOUS | Qty: 1000 | Status: AC

## 2022-09-06 MED FILL — Heparin Sodium (Porcine) Inj 1000 Unit/ML: INTRAMUSCULAR | Qty: 60 | Status: AC

## 2022-10-02 ENCOUNTER — Other Ambulatory Visit: Payer: Self-pay | Admitting: Family Medicine

## 2022-10-02 DIAGNOSIS — I739 Peripheral vascular disease, unspecified: Secondary | ICD-10-CM

## 2022-10-08 ENCOUNTER — Ambulatory Visit (INDEPENDENT_AMBULATORY_CARE_PROVIDER_SITE_OTHER): Payer: 59 | Admitting: Family Medicine

## 2022-10-08 ENCOUNTER — Encounter: Payer: Self-pay | Admitting: Family Medicine

## 2022-10-08 VITALS — BP 122/74 | HR 62 | Ht 70.0 in | Wt 205.0 lb

## 2022-10-08 DIAGNOSIS — Z23 Encounter for immunization: Secondary | ICD-10-CM

## 2022-10-08 DIAGNOSIS — G4733 Obstructive sleep apnea (adult) (pediatric): Secondary | ICD-10-CM | POA: Diagnosis not present

## 2022-10-08 NOTE — Assessment & Plan Note (Signed)
Currently using oral appliance.  Referral placed for repeat sleep study.

## 2022-10-08 NOTE — Progress Notes (Signed)
Benjamin Warren - 62 y.o. male MRN [id number]  Date of birth: Feb 01, 1960  Subjective Chief Complaint  Patient presents with   Sleep Apnea    HPI Benjamin Warren is a 62 year old male here today to discuss sleep apnea.  He has history of sleep apnea diagnosed several years ago.  He was prescribed CPAP previously but did not tolerate this.  Initially had oral appliance made which he has used for quite some time.  Noted to have desaturations after surgery in the hospital and was recommended to have reevaluation for sleep apnea.  They are requesting referral to sleep center in [place].  Wife has witnessed snoring as well as possible apneic episodes.  ROS:  A comprehensive ROS was completed and negative except as noted per HPI  No Known Allergies  Past Medical History:  Diagnosis Date   Anxiety 11/21/2016   Arthritis    Chronic pain syndrome    Crepitus of left TMJ on opening of jaw 11/21/2016   Depression    Fever blister 11/21/2016   Fibromyalgia    GERD (gastroesophageal reflux disease)    Headache    Left rotator cuff tear 10/09/2012   Male hypogonadism 11/21/2016   Peripheral vascular disease (HCC)    PAD. Pt takes pletal   PONV (postoperative nausea and vomiting)    Sleep apnea    had test-no dr told him he needed cpap   TMJ syndrome    Vitamin D deficiency 11/21/2016    Past Surgical History:  Procedure Laterality Date   COLONOSCOPY     ELBOW ARTHROPLASTY  1996   right   HARDWARE REMOVAL  1997   rt wrist   ORIF WRIST FRACTURE  1997   right   ROTATOR CUFF REPAIR Right    January 2021   SHOULDER ARTHROSCOPY  1996   right   SHOULDER ARTHROSCOPY WITH ROTATOR CUFF REPAIR AND SUBACROMIAL DECOMPRESSION Left 10/09/2012   Procedure: LEFT SHOULDER ARTHROSCOPY WITH SUBACROMIAL DECOMPRESSION, DISTAL CLAVICLE EXCISION DEBRIDEMENT AND ROTATOR CUFF REPAIR;  Surgeon: Eulas Post, MD;  Location: [facility];  Service: Orthopedics;  Laterality: Left;   VASECTOMY       Social History   Socioeconomic History   Marital status: Married    Spouse name: Not on file   Number of children: Not on file   Years of education: Not on file   Highest education level: Not on file  Occupational History   Not on file  Tobacco Use   Smoking status: Former    Current packs/day: 0.00    Average packs/day: 0.5 packs/day for 6.0 years (3.0 ttl pk-yrs)    Types: Cigarettes    Start date: 10/02/1976    Quit date: 10/03/1982    Years since quitting: 40.0   Smokeless tobacco: Never  Vaping Use   Vaping status: Never Used  Substance and Sexual Activity   Alcohol use: Yes    Comment: rarely   Drug use: Never   Sexual activity: Yes    Birth control/protection: Post-menopausal  Other Topics Concern   Not on file  Social History Narrative   Married with daughter and son.   Social Determinants of Health   Financial Resource Strain: Not on file  Food Insecurity: Not on file  Transportation Needs: Not on file  Physical Activity: Not on file  Stress: Not on file  Social Connections: Unknown (05/25/2021)   [facility]   Social Network    Social Network:  Not on file    Family History  Problem Relation Age of Onset   Alcohol abuse Mother    Hypertension Mother    Alcohol abuse Other        FAMILY HISTORY   Arthritis Other        FAMILY HISTORY   Stroke Other    Pancreatic cancer Brother    Liver cancer Brother     Health Maintenance  Topic Date Due   Colonoscopy  10/15/2023   DTaP/Tdap/Td (4 - Td or Tdap) 06/23/2026   INFLUENZA VACCINE  Completed   COVID-19 Vaccine  Completed   Hepatitis C Screening  Completed   HIV Screening  Completed   Zoster Vaccines- Shingrix  Completed   HPV VACCINES  Aged Out      ----------------------------------------------------------------------------------------------------------------------------------------------------------------------------------------------------------------- Physical Exam BP 122/74 (BP Location: Left Arm, Patient Position: Sitting, Cuff Size: Large)   Pulse 62   Ht 5\' 10"  (1.778 m)   Wt 205 lb (93 kg)   SpO2 96%   BMI 29.41 kg/m   Physical Exam Constitutional:      Appearance: Normal appearance.  HENT:     Head: Normocephalic and atraumatic.  Eyes:     General: No scleral icterus. Musculoskeletal:     Cervical back: Neck supple.  Neurological:     Mental Status: He is alert.  Psychiatric:        Mood and Affect: Mood normal.        Behavior: Behavior normal.     ------------------------------------------------------------------------------------------------------------------------------------------------------------------------------------------------------------------- Assessment and Plan  OSA (obstructive sleep apnea) Currently using oral appliance.  Referral placed for repeat sleep study.   No orders of the defined types were placed in this encounter.   No follow-ups on file.    This visit occurred during the SARS-CoV-2 public health emergency.  Safety protocols were in place, including screening questions prior to the visit, additional usage of staff PPE, and extensive cleaning of exam room while observing appropriate contact time as indicated for disinfecting solutions.

## 2022-10-22 ENCOUNTER — Other Ambulatory Visit: Payer: Self-pay | Admitting: *Deleted

## 2022-10-22 DIAGNOSIS — I739 Peripheral vascular disease, unspecified: Secondary | ICD-10-CM

## 2022-10-23 ENCOUNTER — Telehealth: Payer: Self-pay | Admitting: Family Medicine

## 2022-10-23 NOTE — Telephone Encounter (Signed)
10/23/2022-Returned patients wife Lupita Leash) call back, no answer and unable to leave message/voicemail full.   Referral, clinical notes, demographics and copies of insurance cards have been re-faxed to [facility] at [phone]. Office will contact patient to schedule referral appointment.    ** Only 2 providers at this office and there may be a wait to get patient NP appointment. Referral staff will get referral entered as soon as it is received per Kendal Hymen. Patient will be contacted to schedule an appointment. [facility]  [address] [place], [place] [address]   Ph: [phone]

## 2022-10-30 ENCOUNTER — Other Ambulatory Visit: Payer: Self-pay | Admitting: Neurosurgery

## 2022-10-30 DIAGNOSIS — M48062 Spinal stenosis, lumbar region with neurogenic claudication: Secondary | ICD-10-CM

## 2022-10-31 NOTE — Progress Notes (Signed)
Patient ID: Benjamin Warren, male   DOB: [birth year], 62 y.o.   MRN: [id number]  Reason for Consult: New Patient (Initial Visit)   Referred by Everrett Coombe, DO  Subjective:     HPI  Benjamin Warren is a 62 y.o. male presenting for PAD.  An ABI was obtained in 2021 and he was started on Pletal at that time but did not see a vascular surgeon.  He has had multiple back surgeries and now describes pain in his right hip that sometimes shoots down his right leg.  He denies calf pain or claudication.  He denies rest pain.  He denies ulceration.  He is a former smoker and quit in 2006.  He is compliant with statin, he does not take an aspirin and he stopped Pletal approximately 3 months ago.  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 Pt takes pletal   PONV (postoperative nausea and vomiting)    Sleep apnea    had test-no dr told him he needed cpap   TMJ syndrome    Vitamin D deficiency 11/21/2016   Family History  Problem Relation Age of Onset   Alcohol abuse Mother    Hypertension Mother    Alcohol abuse Other        FAMILY HISTORY   Arthritis Other        FAMILY HISTORY   Stroke Other    Pancreatic cancer Brother    Liver cancer Brother    Past Surgical History:  Procedure Laterality Date   COLONOSCOPY     ELBOW ARTHROPLASTY  1996   right   HARDWARE REMOVAL  1997   rt wrist   ORIF WRIST FRACTURE  1997   right   ROTATOR CUFF REPAIR Right    January 2021   SHOULDER ARTHROSCOPY  1996   right   SHOULDER ARTHROSCOPY WITH ROTATOR CUFF REPAIR AND SUBACROMIAL DECOMPRESSION Left 10/09/2012   Procedure: LEFT SHOULDER ARTHROSCOPY WITH SUBACROMIAL  DECOMPRESSION, DISTAL CLAVICLE EXCISION DEBRIDEMENT AND ROTATOR CUFF REPAIR;  Surgeon: Eulas Post, MD;  Location: [facility];  Service: Orthopedics;  Laterality: Left;   VASECTOMY      Short Social History:  Social History   Tobacco Use   Smoking status: Former    Current packs/day: 0.00    Average packs/day: 0.5 packs/day for 6.0 years (3.0 ttl pk-yrs)    Types: Cigarettes    Start date: 10/02/1976    Quit date: 10/03/1982    Years since quitting: 40.1   Smokeless tobacco: Never  Substance Use Topics   Alcohol use: Yes    Comment: rarely    No Known Allergies  Current Outpatient Medications  Medication Sig Dispense Refill   amoxicillin (AMOXIL) 500 MG capsule Take 500 mg by mouth every 6 (six) hours.     cholecalciferol (VITAMIN D3) 25 MCG (1000 UNIT) tablet Take 1,000 Units by mouth daily.     cilostazol (PLETAL) 100 MG tablet TAKE 1 TABLET BY MOUTH TWICE A DAY 180 tablet 3   diazepam (VALIUM) 10 MG tablet Take 20 mg by mouth  daily.     etodolac (LODINE XL) 600 MG 24 hr tablet Take 1 tablet (600 mg total) by mouth 2 (two) times daily. 180 tablet 3   gabapentin (NEURONTIN) 300 MG capsule Take 300 mg by mouth 3 (three) times daily.     Misc. Devices MISC Oral Sleep Device to be used every night during sleep. 1 each 0   omeprazole (PRILOSEC) 20 MG capsule TAKE 1 CAPSULE BY MOUTH EVERY DAY 90 capsule 1   rosuvastatin (CRESTOR) 20 MG tablet TAKE 1 TABLET BY MOUTH EVERY DAY 90 tablet 1   valACYclovir (VALTREX) 1000 MG tablet TAKE 1 TABLET BY MOUTH TWICE A DAY (Patient taking differently: Take 1,000 mg by mouth 2 (two) times daily as needed (fever blisters).) 20 tablet 1   No current facility-administered medications for this visit.    REVIEW OF SYSTEMS  Negative other than noted in HPI     Objective:  Objective   Vitals:   11/01/22 1449  BP: 112/72  Pulse: 72  Resp: 20  Temp: 98.3 F (36.8 C)  Weight: 206 lb 14.4 oz (93.8 kg)  Height: 5\' 10"  (1.778  m)   Body mass index is 29.69 kg/m.  Physical Exam General: no acute distress Cardiac: hemodynamically stable, nontachycardic Pulm: normal work of breathing Neuro: alert, no focal deficit Extremities: no edema, cyanosis or wounds Vascular:   Right: Palpable femoral, DP, PT  Left: Palpable femoral, DP, PT   Data: ABI +---------+------------------+-----+---------+--------+  Right   Rt Pressure (mmHg)IndexWaveform Comment   +---------+------------------+-----+---------+--------+  Brachial 129                                       +---------+------------------+-----+---------+--------+  PTA     109               0.84 triphasic          +---------+------------------+-----+---------+--------+  DP      110               0.85 triphasic          +---------+------------------+-----+---------+--------+  Great Toe86                0.67                    +---------+------------------+-----+---------+--------+   +---------+------------------+-----+---------+-------+  Left    Lt Pressure (mmHg)IndexWaveform Comment  +---------+------------------+-----+---------+-------+  Brachial 127                                      +---------+------------------+-----+---------+-------+  PTA     135               1.05 triphasic         +---------+------------------+-----+---------+-------+  DP      154               1.19 triphasic         +---------+------------------+-----+---------+-------+  Great Toe111               0.86                   +---------+------------------+-----+---------+-------+   Compared to previous ABI in 2021: Right 0.76, TP 76 Left 1.1, TP 148     Assessment/Plan:     Benjamin Warren is a 62 y.o.  male with PAD who is presenting for evaluation.  I explained that his ABI is mildly reduced on the right although his current pain is likely related to his hip or back and that he is currently asymptomatic from a vascular  perspective.  We discussed the natural history of PAD and explained the importance of best medical therapy which is abstinence from tobacco products, aspirin and statin.  We discussed that there is no need to restart the Pletal as he does not currently have claudication. -Continue statin, start baby aspirin daily -Plan to follow-up in 1 year with repeat ABI    Recommendations to optimize cardiovascular risk: Abstinence from all tobacco products. Blood glucose control with goal A1c < 7%. Blood pressure control with goal blood pressure < 140/90 mmHg. Lipid reduction therapy with goal LDL-C <100 mg/dL  Aspirin 81mg  PO QD.  Atorvastatin 40-80mg  PO QD (or other "high intensity" statin therapy).     Daria Pastures MD [facility]

## 2022-11-01 ENCOUNTER — Ambulatory Visit (HOSPITAL_COMMUNITY)
Admission: RE | Admit: 2022-11-01 | Discharge: 2022-11-01 | Disposition: A | Discharge status: Home / Self Care | Payer: 59 | Source: Ambulatory Visit | Attending: Vascular Surgery | Admitting: Vascular Surgery

## 2022-11-01 ENCOUNTER — Encounter: Payer: Self-pay | Admitting: Vascular Surgery

## 2022-11-01 ENCOUNTER — Ambulatory Visit (INDEPENDENT_AMBULATORY_CARE_PROVIDER_SITE_OTHER): Payer: 59 | Admitting: Vascular Surgery

## 2022-11-01 VITALS — BP 112/72 | HR 72 | Temp 98.3°F | Resp 20 | Ht 70.0 in | Wt 206.9 lb

## 2022-11-01 DIAGNOSIS — I739 Peripheral vascular disease, unspecified: Secondary | ICD-10-CM | POA: Diagnosis present

## 2022-11-01 LAB — VAS US ABI WITH/WO TBI
Left ABI: 1.19
Right ABI: 0.85

## 2022-11-02 ENCOUNTER — Ambulatory Visit
Admission: RE | Admit: 2022-11-02 | Discharge: 2022-11-02 | Disposition: A | Discharge status: Home / Self Care | Payer: 59 | Source: Ambulatory Visit | Attending: Neurosurgery | Admitting: Neurosurgery

## 2022-11-02 DIAGNOSIS — M48062 Spinal stenosis, lumbar region with neurogenic claudication: Secondary | ICD-10-CM

## 2022-11-02 MED ORDER — GADOPICLENOL 0.5 MMOL/ML IV SOLN
9.0000 mL | Freq: Once | INTRAVENOUS | Status: AC | PRN
  Administered 2022-11-02: 9 mL via INTRAVENOUS

## 2022-11-26 ENCOUNTER — Other Ambulatory Visit: Payer: Self-pay

## 2022-11-26 DIAGNOSIS — I739 Peripheral vascular disease, unspecified: Secondary | ICD-10-CM

## 2022-12-03 ENCOUNTER — Other Ambulatory Visit: Payer: Self-pay | Admitting: Sports Medicine

## 2022-12-03 DIAGNOSIS — G729 Myopathy, unspecified: Secondary | ICD-10-CM

## 2022-12-11 ENCOUNTER — Ambulatory Visit: Payer: 59 | Admitting: Family Medicine

## 2022-12-11 ENCOUNTER — Ambulatory Visit: Payer: Self-pay | Admitting: Family Medicine

## 2022-12-11 ENCOUNTER — Encounter: Payer: Self-pay | Admitting: Family Medicine

## 2022-12-11 VITALS — BP 126/70 | HR 71 | Resp 12 | Ht 70.0 in | Wt 209.1 lb

## 2022-12-11 DIAGNOSIS — S20462A Insect bite (nonvenomous) of left back wall of thorax, initial encounter: Secondary | ICD-10-CM | POA: Diagnosis not present

## 2022-12-11 DIAGNOSIS — W57XXXA Bitten or stung by nonvenomous insect and other nonvenomous arthropods, initial encounter: Secondary | ICD-10-CM | POA: Diagnosis not present

## 2022-12-11 MED ORDER — DOXYCYCLINE HYCLATE 100 MG PO TABS
200.0000 mg | ORAL_TABLET | Freq: Once | ORAL | 0 refills | Status: AC
Start: 2022-12-11 — End: 2022-12-11

## 2022-12-11 NOTE — Telephone Encounter (Signed)
Patient scheduled with Dr. Linford Arnold today at 2 pm for an evaluation.

## 2022-12-11 NOTE — Telephone Encounter (Signed)
Copied from CRM [id number]. Topic: Clinical - Red Word Triage >> Dec 11, 2022 10:13 AM Tiffany H wrote: Red Word that prompted transfer to Nurse Triage: Patient's wife Lupita Leash advised that she pulled an imbedded tick off of her husband. She advised that the spot looks "nasty", indicating black and red bits. No swelling. She saved the tick. She put Neosporin on it and it still looks bad.   Chief Complaint: Tick Bite Symptoms: Reddened Ring with black center Frequency: Acute, over the last 24 hours Pertinent Negatives: Patient denies fever. Disposition: [] ED /[] Urgent Care (no appt availability in office) / [x] Appointment(In office/virtual)/ []  [place] [facility]/ [] Home Care/ [] Refused Recommended Disposition /[] [place] [facility]/ []  Follow-up with PCP Additional Notes: Wife of the patient, called on the patient's behalf regarding a tick bite post removal. States the area just looks "bad," and is concerned about the need for further treatment. Mr. Meuse went out into the woods yesterday for outdoor activity, and Mrs. Vallie states she is estimating the tick had not been attached longer than 24 hours. While triaging, Mrs. Mcrea stated the bite looked worse than yesterday.   Reason for Disposition  Red ring or bull's-eye rash occurs at tick bite  Answer Assessment - Initial Assessment Questions 1. ATTACHED:  "Is the tick still on the skin?"  (e.g., yes, no, unsure)     No, pulled it off last night. a 2. ONSET - TICK STILL ATTACHED:  "How long do you think the tick has been on your skin?" (e.g., hours, days, unsure)  Note:  Is there a recent activity (camping, hiking) where the caller may have been exposed?     Tick not attached. Went out in the woods yesterday for some outdoor activity.  3. ONSET - TICK NOT STILL ATTACHED: "If the tick has been removed, how long do you think the tick was attached before you removed it?" (e.g., 5 hours, 2 days). "When was this?"     Unsure, estimated 24 hours.   4. LOCATION: "Where is the tick bite located?" (e.g., arm, leg)     Flank area (Left side) 5. TYPE of TICK: "Is it a wood tick or a deer tick?" (e.g., deer tick, wood tick; unsure)     Tiny Tick, Black on the front, Stebbins on the back.  6. SIZE of TICK: "How big is the tick?" (e.g., size of poppy seed, apple seed, watermelon seed; unsure) Note: Deer ticks can be the size of a poppy seed (nymph) or an apple seed (adult).       Tiny tick,  7. ENGORGED: "Did the tick look flat or engorged (full, swollen)?" (e.g., flat, engorged; unsure)     No, states the tick is tiny.  8. OTHER SYMPTOMS: "Do you have any other symptoms?" (e.g., fever, rash, redness at bite area, red ring around bite)     Redness at the area, red ring, with blackened skin in the middle. The blackened area is directly where the tick was embedded.  Protocols used: Tick Bite-A-AH

## 2022-12-11 NOTE — Progress Notes (Signed)
   Acute Office Visit  Subjective:     Patient ID: Benjamin Warren, male    DOB: 1960-12-12, 62 y.o.   MRN: [id number]  Chief Complaint  Patient presents with   Tick Removal    HPI Patient is in today for tick bite he was out in the woods 2 nights ago to retrieve his D[place].  But last night he actually noticed the tick on his left.  He was able to remove it but the area is irritated now.  No fevers chills or rash no unusual headaches.  He was able to bring the check in with him today.  ROS      Objective:    BP 126/70   Pulse 71   Resp 12   Ht 5\' 10"  (1.778 m)   Wt 209 lb 1.9 oz (94.9 kg)   SpO2 97%   BMI 30.01 kg/m    Physical Exam Skin:    Comments: On the left back he has an erythematous area approximately 1 cm in size with purple discoloration in the center that almost looks like a small hematoma underneath the skin.  No open wound or drainage.  No surrounding rash.     No results found for any visits on 12/11/22.      Assessment & Plan:   Problem List Items Addressed This Visit   None Visit Diagnoses     Tick bite of left back wall of thorax, initial encounter    -  Primary   Relevant Medications   doxycycline (VIBRA-TABS) 100 MG tablet      Tick bite-will treat prophylactically with 2 mg of doxycycline.  Did encourage him to let us know if he develops any new symptoms such as fevers chills rash or unusual headaches.  Meds ordered this encounter  Medications   doxycycline (VIBRA-TABS) 100 MG tablet    Sig: Take 2 tablets (200 mg total) by mouth once for 1 dose.    Dispense:  2 tablet    Refill:  0    No follow-ups on file.  Nani Gasser, MD

## 2022-12-25 ENCOUNTER — Telehealth: Payer: Self-pay

## 2022-12-25 DIAGNOSIS — N400 Enlarged prostate without lower urinary tract symptoms: Secondary | ICD-10-CM

## 2022-12-25 DIAGNOSIS — Z0000 Encounter for general adult medical examination without abnormal findings: Secondary | ICD-10-CM

## 2022-12-25 DIAGNOSIS — R251 Tremor, unspecified: Secondary | ICD-10-CM

## 2022-12-25 DIAGNOSIS — E291 Testicular hypofunction: Secondary | ICD-10-CM

## 2022-12-25 DIAGNOSIS — R413 Other amnesia: Secondary | ICD-10-CM

## 2022-12-25 DIAGNOSIS — E559 Vitamin D deficiency, unspecified: Secondary | ICD-10-CM

## 2022-12-25 DIAGNOSIS — Z125 Encounter for screening for malignant neoplasm of prostate: Secondary | ICD-10-CM

## 2022-12-25 DIAGNOSIS — I739 Peripheral vascular disease, unspecified: Secondary | ICD-10-CM

## 2022-12-25 NOTE — Telephone Encounter (Signed)
Patient scheduled for Physical 02/11/23, patient would like to come in a week prior to get fasting labs please advise, thanks.

## 2023-02-07 LAB — CBC WITH DIFFERENTIAL/PLATELET
Basophils Absolute: 0.1 10*3/uL (ref 0.0–0.2)
Basos: 1 %
EOS (ABSOLUTE): 0.2 10*3/uL (ref 0.0–0.4)
Eos: 3 %
Hematocrit: 47.6 % (ref 37.5–51.0)
Hemoglobin: 15.7 g/dL (ref 13.0–17.7)
Immature Grans (Abs): 0 10*3/uL (ref 0.0–0.1)
Immature Granulocytes: 0 %
Lymphocytes Absolute: 2.2 10*3/uL (ref 0.7–3.1)
Lymphs: 27 %
MCH: 29.8 pg (ref 26.6–33.0)
MCHC: 33 g/dL (ref 31.5–35.7)
MCV: 91 fL (ref 79–97)
Monocytes Absolute: 0.8 10*3/uL (ref 0.1–0.9)
Monocytes: 10 %
Neutrophils Absolute: 4.9 10*3/uL (ref 1.4–7.0)
Neutrophils: 59 %
Platelets: 274 10*3/uL (ref 150–450)
RBC: 5.26 x10E6/uL (ref 4.14–5.80)
RDW: 13 % (ref 11.6–15.4)
WBC: 8.3 10*3/uL (ref 3.4–10.8)

## 2023-02-07 LAB — PSA: Prostate Specific Ag, Serum: 1.5 ng/mL (ref 0.0–4.0)

## 2023-02-07 LAB — CMP14+EGFR
ALT: 24 [IU]/L (ref 0–44)
AST: 26 [IU]/L (ref 0–40)
Albumin: 4.2 g/dL (ref 3.8–4.9)
Alkaline Phosphatase: 97 [IU]/L (ref 44–121)
BUN/Creatinine Ratio: 16 (ref 10–24)
BUN: 18 mg/dL (ref 8–27)
Bilirubin Total: 0.6 mg/dL (ref 0.0–1.2)
CO2: 20 mmol/L (ref 20–29)
Calcium: 9.2 mg/dL (ref 8.6–10.2)
Chloride: 104 mmol/L (ref 96–106)
Creatinine, Ser: 1.15 mg/dL (ref 0.76–1.27)
Globulin, Total: 2.6 g/dL (ref 1.5–4.5)
Glucose: 105 mg/dL — ABNORMAL HIGH (ref 70–99)
Potassium: 4.5 mmol/L (ref 3.5–5.2)
Sodium: 139 mmol/L (ref 134–144)
Total Protein: 6.8 g/dL (ref 6.0–8.5)
eGFR: 73 mL/min/{1.73_m2} (ref >59)

## 2023-02-07 LAB — LIPID PANEL WITH LDL/HDL RATIO
Cholesterol, Total: 125 mg/dL (ref 100–199)
HDL: 30 mg/dL — ABNORMAL LOW (ref >39)
LDL Chol Calc (NIH): 70 mg/dL (ref 0–99)
LDL/HDL Ratio: 2.3 {ratio} (ref 0.0–3.6)
Triglycerides: 138 mg/dL (ref 0–149)
VLDL Cholesterol Cal: 25 mg/dL (ref 5–40)

## 2023-02-07 LAB — VITAMIN D 25 HYDROXY (VIT D DEFICIENCY, FRACTURES): Vit D, 25-Hydroxy: 41.3 ng/mL (ref 30.0–100.0)

## 2023-02-07 LAB — TSH: TSH: 1.99 u[IU]/mL (ref 0.450–4.500)

## 2023-02-11 ENCOUNTER — Ambulatory Visit (INDEPENDENT_AMBULATORY_CARE_PROVIDER_SITE_OTHER): Payer: 59 | Admitting: Family Medicine

## 2023-02-11 ENCOUNTER — Encounter: Payer: Self-pay | Admitting: Family Medicine

## 2023-02-11 ENCOUNTER — Other Ambulatory Visit: Payer: Self-pay | Admitting: Student

## 2023-02-11 VITALS — BP 141/80 | HR 61 | Ht 70.0 in | Wt 205.0 lb

## 2023-02-11 DIAGNOSIS — M5416 Radiculopathy, lumbar region: Secondary | ICD-10-CM

## 2023-02-11 DIAGNOSIS — M48062 Spinal stenosis, lumbar region with neurogenic claudication: Secondary | ICD-10-CM | POA: Diagnosis not present

## 2023-02-11 DIAGNOSIS — R7309 Other abnormal glucose: Secondary | ICD-10-CM

## 2023-02-11 DIAGNOSIS — Z0000 Encounter for general adult medical examination without abnormal findings: Secondary | ICD-10-CM | POA: Diagnosis not present

## 2023-02-11 LAB — POCT GLYCOSYLATED HEMOGLOBIN (HGB A1C): HbA1c, POC (controlled diabetic range): 5.7 % (ref 0.0–7.0)

## 2023-02-11 MED ORDER — PREGABALIN 100 MG PO CAPS: 100.0000 mg | ORAL_CAPSULE | Freq: Two times a day (BID) | ORAL | 1 refills | Status: DC

## 2023-02-11 NOTE — Patient Instructions (Signed)

## 2023-02-11 NOTE — Assessment & Plan Note (Signed)
Well adult Recent labs reviewed with him.  Screenings: per lab orders Immunizations: UTD Anticipatory guidance/Risk factor reduction:  Recommendations per AVS.

## 2023-02-11 NOTE — Progress Notes (Signed)
Benjamin Warren - 63 y.o. male MRN [id number]  Date of birth: [birth year]  Subjective Chief Complaint  Patient presents with   Annual Exam    HPI RONTRELL MOQUIN is a 63 y.o. male here today for annual exam.   He reports that he is having increase radicular pain down the L leg.  He is followed by neurosurgery.    He is somewhat active.  Limited some due to chronic pain. He feels that diet is pretty good.  He continues to do well with current medications.    Remote history of smoking.  Rare EtOH use.   Review of Systems  Constitutional:  Negative for chills, fever, malaise/fatigue and weight loss.  HENT:  Negative for congestion, ear pain and sore throat.   Eyes:  Negative for blurred vision, double vision and pain.  Respiratory:  Negative for cough and shortness of breath.   Cardiovascular:  Negative for chest pain and palpitations.  Gastrointestinal:  Negative for abdominal pain, blood in stool, constipation, heartburn and nausea.  Genitourinary:  Negative for dysuria and urgency.  Musculoskeletal:  Negative for joint pain and myalgias.  Neurological:  Negative for dizziness and headaches.  Endo/Heme/Allergies:  Does not bruise/bleed easily.  Psychiatric/Behavioral:  Negative for depression. The patient is not nervous/anxious and does not have insomnia.     No Known Allergies  Past Medical History:  Diagnosis Date   Anxiety 11/21/2016   Arthritis    Chronic pain syndrome    Crepitus of left TMJ on opening of jaw 11/21/2016   Depression    Fever blister 11/21/2016   Fibromyalgia    GERD (gastroesophageal reflux disease)    Headache    Left rotator cuff tear 10/09/2012   Male hypogonadism 11/21/2016   Peripheral vascular disease (HCC)    PAD. Pt takes pletal   PONV (postoperative nausea and vomiting)    Sleep apnea    had test-no dr told him he needed cpap   TMJ syndrome    Vitamin D deficiency 11/21/2016    Past Surgical History:  Procedure Laterality Date    COLONOSCOPY     ELBOW ARTHROPLASTY  1996   right   HARDWARE REMOVAL  1997   rt wrist   ORIF WRIST FRACTURE  1997   right   ROTATOR CUFF REPAIR Right    January 2021   SHOULDER ARTHROSCOPY  1996   right   SHOULDER ARTHROSCOPY WITH ROTATOR CUFF REPAIR AND SUBACROMIAL DECOMPRESSION Left 10/09/2012   Procedure: LEFT SHOULDER ARTHROSCOPY WITH SUBACROMIAL DECOMPRESSION, DISTAL CLAVICLE EXCISION DEBRIDEMENT AND ROTATOR CUFF REPAIR;  Surgeon: Eulas Post, MD;  Location: [facility];  Service: Orthopedics;  Laterality: Left;   VASECTOMY      Social History   Socioeconomic History   Marital status: Married    Spouse name: Not on file   Number of children: Not on file   Years of education: Not on file   Highest education level: Not on file  Occupational History   Not on file  Tobacco Use   Smoking status: Former    Current packs/day: 0.00    Average packs/day: 0.5 packs/day for 6.0 years (3.0 ttl pk-yrs)    Types: Cigarettes    Start date: 10/02/1976    Quit date: 10/03/1982    Years since quitting: 40.3   Smokeless tobacco: Never  Vaping Use   Vaping status: Never Used  Substance and Sexual Activity   Alcohol use: Yes    Comment: rarely  Drug use: Never   Sexual activity: Yes    Birth control/protection: Post-menopausal  Other Topics Concern   Not on file  Social History Narrative   Married with daughter and son.   Social Drivers of Health   Financial Resource Strain: Patient Declined (10/22/2022)   Received from [facility]   Overall Financial Resource Strain (CARDIA)    Difficulty of Paying Living Expenses: Patient declined  Food Insecurity: Patient Declined (10/22/2022)   Received from [facility]   Hunger Vital Sign    Worried About Running Out of Food in the Last Year: Patient declined    Ran Out of Food in the Last Year: Patient declined  Transportation Needs: Patient Declined (10/22/2022)   Received from [facility] -  Transportation    Lack of Transportation (Medical): Patient declined    Lack of Transportation (Non-Medical): Patient declined  Physical Activity: Not on file  Stress: Not on file  Social Connections: Unknown (05/25/2021)   Received [facility]   Social Network    Social Network: Not on file    Family History  Problem Relation Age of Onset   Alcohol abuse Mother    Hypertension Mother    Alcohol abuse Other        FAMILY HISTORY   Arthritis Other        FAMILY HISTORY   Stroke Other    Pancreatic cancer Brother    Liver cancer Brother     Health Maintenance  Topic Date Due   COVID-19 Vaccine (6 - 2024-25 season) 02/27/2024 (Originally 09/15/2022)   Colonoscopy  10/15/2023   DTaP/Tdap/Td (4 - Td or Tdap) 06/23/2026   INFLUENZA VACCINE  Completed   Hepatitis C Screening  Completed   HIV Screening  Completed   Zoster Vaccines- Shingrix  Completed   Pneumococcal Vaccine 39-67 Years old  Aged Out   HPV VACCINES  Aged Out     ----------------------------------------------------------------------------------------------------------------------------------------------------------------------------------------------------------------- Physical Exam BP (!) 141/80 (BP Location: Right Arm, Patient Position: Sitting, Cuff Size: Normal)   Pulse 61   Ht 5\' 10"  (1.778 m)   Wt 205 lb (93 kg)   SpO2 97%   BMI 29.41 kg/m   Physical Exam Constitutional:      General: He is not in acute distress. HENT:     Head: Normocephalic and atraumatic.     Right Ear: Tympanic membrane and external ear normal.     Left Ear: Tympanic membrane and external ear normal.  Eyes:     General: No scleral icterus. Neck:     Thyroid: No thyromegaly.  Cardiovascular:     Rate and Rhythm: Normal rate and regular rhythm.     Heart sounds: Normal heart sounds.  Pulmonary:     Effort: Pulmonary effort is normal.     Breath sounds: Normal breath sounds.  Abdominal:     General:  Bowel sounds are normal. There is no distension.     Palpations: Abdomen is soft.     Tenderness: There is no abdominal tenderness. There is no guarding.  Musculoskeletal:     Cervical back: Normal range of motion.  Lymphadenopathy:     Cervical: No cervical adenopathy.  Skin:    General: Skin is warm and dry.     Findings: No rash.  Neurological:     Mental Status: He is alert and oriented to person, place, and time.     Cranial Nerves: No cranial nerve deficit.     Motor:  No abnormal muscle tone.  Psychiatric:        Mood and Affect: Mood normal.        Behavior: Behavior normal.     ------------------------------------------------------------------------------------------------------------------------------------------------------------------------------------------------------------------- Assessment and Plan  Well adult exam Well adult Recent labs reviewed with him.  Screenings: per lab orders Immunizations: UTD Anticipatory guidance/Risk factor reduction:  Recommendations per AVS.   Lumbar spinal stenosis status post L4-L5 PLIF Having increased radicular symptoms. Currently on prednisone.  Change gabapentin to lyrica 100mg  BID.    Meds ordered this encounter  Medications   pregabalin (LYRICA) 100 MG capsule    Sig: Take 1 capsule (100 mg total) by mouth 2 (two) times daily.    Dispense:  60 capsule    Refill:  1    No follow-ups on file.    This visit occurred during the SARS-CoV-2 public health emergency.  Safety protocols were in place, including screening questions prior to the visit, additional usage of staff PPE, and extensive cleaning of exam room while observing appropriate contact time as indicated for disinfecting solutions.

## 2023-02-11 NOTE — Assessment & Plan Note (Signed)
Having increased radicular symptoms. Currently on prednisone.  Change gabapentin to lyrica 100mg  BID.

## 2023-02-11 NOTE — Addendum Note (Signed)
Addended by: Ardyth Man on: 02/11/2023 10:53 AM   Modules accepted: Orders

## 2023-02-17 ENCOUNTER — Encounter: Payer: Self-pay | Admitting: Student

## 2023-02-19 ENCOUNTER — Other Ambulatory Visit: Payer: Self-pay | Admitting: Family Medicine

## 2023-02-19 DIAGNOSIS — K219 Gastro-esophageal reflux disease without esophagitis: Secondary | ICD-10-CM

## 2023-02-19 DIAGNOSIS — I739 Peripheral vascular disease, unspecified: Secondary | ICD-10-CM

## 2023-02-19 DIAGNOSIS — R252 Cramp and spasm: Secondary | ICD-10-CM

## 2023-02-20 ENCOUNTER — Ambulatory Visit
Admission: RE | Admit: 2023-02-20 | Discharge: 2023-02-20 | Disposition: A | Discharge status: Home / Self Care | Payer: 59 | Source: Ambulatory Visit | Attending: Student | Admitting: Student

## 2023-02-20 DIAGNOSIS — M5416 Radiculopathy, lumbar region: Secondary | ICD-10-CM

## 2023-02-20 MED ORDER — ONDANSETRON HCL 4 MG/2ML IJ SOLN: 4.0000 mg | Freq: Once | INTRAMUSCULAR | Status: DC | PRN

## 2023-02-20 MED ORDER — IOPAMIDOL (ISOVUE-M 200) INJECTION 41%
20.0000 mL | Freq: Once | INTRAMUSCULAR | Status: AC
  Administered 2023-02-20: 20 mL via INTRATHECAL

## 2023-02-20 MED ORDER — DIAZEPAM 5 MG PO TABS: 10.0000 mg | ORAL_TABLET | Freq: Once | ORAL | Status: DC

## 2023-02-20 MED ORDER — MEPERIDINE HCL 50 MG/ML IJ SOLN: 50.0000 mg | Freq: Once | INTRAMUSCULAR | Status: DC | PRN

## 2023-02-20 NOTE — Discharge Instructions (Signed)

## 2023-03-05 ENCOUNTER — Encounter: Payer: Self-pay | Admitting: Family Medicine

## 2023-03-05 ENCOUNTER — Ambulatory Visit: Payer: 59 | Admitting: Family Medicine

## 2023-03-05 VITALS — BP 118/72 | HR 61 | Ht 70.0 in | Wt 206.0 lb

## 2023-03-05 DIAGNOSIS — I700 Atherosclerosis of aorta: Secondary | ICD-10-CM | POA: Insufficient documentation

## 2023-03-05 NOTE — Progress Notes (Signed)
Benjamin Warren - 63 y.o. male MRN [id number]  Date of birth: [birth year]  Subjective Chief Complaint  Patient presents with   Medical Management of Chronic Issues    HPI Benjamin Warren is a 63 year old male here today to discuss findings on recent myelogram.  Recently had myelogram of the lumbar spine and aortic atherosclerosis was noted.  History of peripheral arterial disease.  He has seen vascular surgery.  Previously on Pletal but this was discontinued by his vascular surgeon.  He denies any increased claudication symptoms.  He is on Crestor 20 mg with recent LDL of 70.  ROS:  A comprehensive ROS was completed and negative except as noted per HPI  No Known Allergies  Past Medical History:  Diagnosis Date   Anxiety 11/21/2016   Arthritis    Chronic pain syndrome    Crepitus of left TMJ on opening of jaw 11/21/2016   Depression    Fever blister 11/21/2016   Fibromyalgia    GERD (gastroesophageal reflux disease)    Headache    Left rotator cuff tear 10/09/2012   Male hypogonadism 11/21/2016   Peripheral vascular disease (HCC)    PAD. Pt takes pletal   PONV (postoperative nausea and vomiting)    Sleep apnea    had test-no dr told him he needed cpap   TMJ syndrome    Vitamin D deficiency 11/21/2016    Past Surgical History:  Procedure Laterality Date   COLONOSCOPY     ELBOW ARTHROPLASTY  1996   right   HARDWARE REMOVAL  1997   rt wrist   ORIF WRIST FRACTURE  1997   right   ROTATOR CUFF REPAIR Right    January 2021   SHOULDER ARTHROSCOPY  1996   right   SHOULDER ARTHROSCOPY WITH ROTATOR CUFF REPAIR AND SUBACROMIAL DECOMPRESSION Left 10/09/2012   Procedure: LEFT SHOULDER ARTHROSCOPY WITH SUBACROMIAL DECOMPRESSION, DISTAL CLAVICLE EXCISION DEBRIDEMENT AND ROTATOR CUFF REPAIR;  Surgeon: Eulas Post, MD;  Location: [facility];  Service: Orthopedics;  Laterality: Left;   VASECTOMY      Social History   Socioeconomic History   Marital status: Married     Spouse name: Not on file   Number of children: Not on file   Years of education: Not on file   Highest education level: Not on file  Occupational History   Not on file  Tobacco Use   Smoking status: Former    Current packs/day: 0.00    Average packs/day: 0.5 packs/day for 6.0 years (3.0 ttl pk-yrs)    Types: Cigarettes    Start date: 10/02/1976    Quit date: 10/03/1982    Years since quitting: 40.4   Smokeless tobacco: Never  Vaping Use   Vaping status: Never Used  Substance and Sexual Activity   Alcohol use: Yes    Comment: rarely   Drug use: Never   Sexual activity: Yes    Birth control/protection: Post-menopausal  Other Topics Concern   Not on file  Social History Narrative   Married with daughter and son.   Social Drivers of Health   Financial Resource Strain: Patient Declined (10/22/2022)   Received [facility]   Overall Financial Resource Strain (CARDIA)    Difficulty of Paying Living Expenses: Patient declined  Food Insecurity: Patient Declined (10/22/2022)   Received from [facility]   Hunger Vital Sign    Worried About Running Out of Food in the Last Year: Patient declined    Ran Out  of Food in the Last Year: Patient declined  Transportation Needs: Patient Declined (10/22/2022)   Received from [facility] - [facility]    Lack of Transportation (Medical): Patient declined    Lack of Transportation (Non-Medical): Patient declined  Physical Activity: Not on file  Stress: Not on file  Social Connections: Unknown (05/25/2021)   Received from [facility]   Social Network    Social Network: Not on file    Family History  Problem Relation Age of Onset   Alcohol abuse Mother    Hypertension Mother    Alcohol abuse Other        FAMILY HISTORY   Arthritis Other        FAMILY HISTORY   Stroke Other    Pancreatic cancer Brother    Liver cancer Brother     Health Maintenance  Topic Date Due   COVID-19 Vaccine (6 -  2024-25 season) 02/27/2024 (Originally 09/15/2022)   Colonoscopy  10/15/2023   DTaP/Tdap/Td (4 - Td or Tdap) 06/23/2026   INFLUENZA VACCINE  Completed   Hepatitis C Screening  Completed   HIV Screening  Completed   Zoster Vaccines- Shingrix  Completed   Pneumococcal Vaccine 72-75 Years old  Aged Out   HPV VACCINES  Aged Out     ----------------------------------------------------------------------------------------------------------------------------------------------------------------------------------------------------------------- Physical Exam BP 118/72 (BP Location: Right Arm, Patient Position: Sitting, Cuff Size: Normal)   Pulse 61   Ht 5\' 10"  (1.778 m)   Wt 206 lb (93.4 kg)   SpO2 96%   BMI 29.56 kg/m   Physical Exam Constitutional:      Appearance: Normal appearance.  HENT:     Head: Normocephalic and atraumatic.  Eyes:     General: No scleral icterus. Cardiovascular:     Rate and Rhythm: Normal rate and regular rhythm.  Pulmonary:     Effort: Pulmonary effort is normal.     Breath sounds: Normal breath sounds.  Neurological:     Mental Status: He is alert.  Psychiatric:        Mood and Affect: Mood normal.        Behavior: Behavior normal.     ------------------------------------------------------------------------------------------------------------------------------------------------------------------------------------------------------------------- Assessment and Plan  Aortic atherosclerosis (HCC) Continue aggressive management of cholesterol with Crestor at current strength.  He does have peripheral arterial disease in addition to his aortic atherosclerotic disease.  He denies any new or worsening claudication symptoms.  He does he take an 81 mg aspirin daily as well due to his vascular disease.   No orders of the defined types were placed in this encounter.   No follow-ups on file.    This visit occurred during the SARS-CoV-2 public health  emergency.  Safety protocols were in place, including screening questions prior to the visit, additional usage of staff PPE, and extensive cleaning of exam room while observing appropriate contact time as indicated for disinfecting solutions.

## 2023-03-05 NOTE — Assessment & Plan Note (Signed)
Continue aggressive management of cholesterol with Crestor at current strength.  He does have peripheral arterial disease in addition to his aortic atherosclerotic disease.  He denies any new or worsening claudication symptoms.  He does he take an 81 mg aspirin daily as well due to his vascular disease.

## 2023-03-06 ENCOUNTER — Encounter (INDEPENDENT_AMBULATORY_CARE_PROVIDER_SITE_OTHER): Payer: 59 | Admitting: Sports Medicine

## 2023-03-06 ENCOUNTER — Ambulatory Visit: Payer: 59 | Admitting: Sports Medicine

## 2023-03-06 ENCOUNTER — Other Ambulatory Visit: Payer: Self-pay

## 2023-03-06 ENCOUNTER — Other Ambulatory Visit: Payer: Self-pay | Admitting: Family Medicine

## 2023-03-06 DIAGNOSIS — M4316 Spondylolisthesis, lumbar region: Secondary | ICD-10-CM

## 2023-03-06 MED ORDER — VALACYCLOVIR HCL 1 G PO TABS
1000.0000 mg | ORAL_TABLET | Freq: Two times a day (BID) | ORAL | 1 refills | Status: DC
  Filled 2023-03-06: qty 20, 10d supply, fill #0

## 2023-03-06 NOTE — Telephone Encounter (Signed)
Copied from CRM [id number]. Topic: Clinical - Medication Refill >> Mar 06, 2023  3:34 PM Benjamin Warren wrote: Most Recent Primary Care Visit:  Provider: Everrett Coombe  Department: [facility]  Visit Type: OFFICE VISIT  Date: 03/05/2023  Medication: diazepam (VALIUM) 10 MG tablet AND valACYclovir (VALTREX) 1000 MG tablet  Has the patient contacted their pharmacy? Yes (Agent: If no, request that the patient contact the pharmacy for the refill. If patient does not wish to contact the pharmacy document the reason why and proceed with request.) (Agent: If yes, when and what did the pharmacy advise?) Pharmacy needs doctor's order to refill  Is this the correct pharmacy for this prescription? [facility] - [facility] If no, delete pharmacy and type the correct one.  This is the patient's preferred pharmacy:  [id number] [place], [place] - [address] [place] [address] Phone: [phone] Fax: [phone]   Has the prescription been filled recently? No  Is the patient out of the medication? No  Has the patient been seen for an appointment in the last year OR does the patient have an upcoming appointment? Yes  Can we respond through MyChart? Yes  Agent: Please be advised that Rx refills may take up to 3 business days. We ask that you follow-up with your pharmacy.

## 2023-03-07 ENCOUNTER — Other Ambulatory Visit: Payer: Self-pay

## 2023-03-07 MED ORDER — DIAZEPAM 10 MG PO TABS
20.0000 mg | ORAL_TABLET | Freq: Every day | ORAL | 0 refills | Status: AC
  Filled 2023-03-07: qty 30, 15d supply, fill #0

## 2023-03-08 NOTE — Telephone Encounter (Signed)

## 2023-03-11 ENCOUNTER — Ambulatory Visit: Payer: 59 | Admitting: Sports Medicine

## 2023-03-21 ENCOUNTER — Other Ambulatory Visit: Payer: Self-pay

## 2023-03-24 ENCOUNTER — Ambulatory Visit: Payer: 59 | Attending: Sports Medicine | Admitting: Physical Therapy

## 2023-03-24 ENCOUNTER — Encounter: Payer: Self-pay | Admitting: Physical Therapy

## 2023-03-24 ENCOUNTER — Other Ambulatory Visit: Payer: Self-pay

## 2023-03-24 DIAGNOSIS — G8929 Other chronic pain: Secondary | ICD-10-CM | POA: Insufficient documentation

## 2023-03-24 DIAGNOSIS — M5441 Lumbago with sciatica, right side: Secondary | ICD-10-CM | POA: Diagnosis not present

## 2023-03-24 DIAGNOSIS — M6281 Muscle weakness (generalized): Secondary | ICD-10-CM | POA: Insufficient documentation

## 2023-03-24 DIAGNOSIS — M4316 Spondylolisthesis, lumbar region: Secondary | ICD-10-CM | POA: Diagnosis not present

## 2023-03-24 DIAGNOSIS — M5442 Lumbago with sciatica, left side: Secondary | ICD-10-CM | POA: Insufficient documentation

## 2023-03-24 NOTE — Therapy (Addendum)
 OUTPATIENT PHYSICAL THERAPY THORACOLUMBAR EVALUATION/Discharge Summary   Patient Name: Benjamin Warren MRN: [id number] DOB:[birth year], 63 y.o., male Today's Date: 03/24/2023  END OF SESSION:  PT End of Session - 03/24/23 1023     Visit Number 1    Date for PT Re-Evaluation 05/05/23    PT Start Time 1020    PT Stop Time 1100    PT Time Calculation (min) 40 min    Activity Tolerance Patient tolerated treatment well    Behavior During Therapy [facility] for tasks assessed/performed             Past Medical History:  Diagnosis Date   Anxiety 11/21/2016   Arthritis    Chronic pain syndrome    Crepitus of left TMJ on opening of jaw 11/21/2016   Depression    Fever blister 11/21/2016   Fibromyalgia    GERD (gastroesophageal reflux disease)    Headache    Left rotator cuff tear 10/09/2012   Male hypogonadism 11/21/2016   Peripheral vascular disease (HCC)    PAD. Pt takes pletal   PONV (postoperative nausea and vomiting)    Sleep apnea    had test-no dr told him he needed cpap   TMJ syndrome    Vitamin D deficiency 11/21/2016   Past Surgical History:  Procedure Laterality Date   COLONOSCOPY     ELBOW ARTHROPLASTY  1996   right   HARDWARE REMOVAL  1997   rt wrist   ORIF WRIST FRACTURE  1997   right   ROTATOR CUFF REPAIR Right    January 2021   SHOULDER ARTHROSCOPY  1996   right   SHOULDER ARTHROSCOPY WITH ROTATOR CUFF REPAIR AND SUBACROMIAL DECOMPRESSION Left 10/09/2012   Procedure: LEFT SHOULDER ARTHROSCOPY WITH SUBACROMIAL DECOMPRESSION, DISTAL CLAVICLE EXCISION DEBRIDEMENT AND ROTATOR CUFF REPAIR;  Surgeon: Eulas Post, MD;  Location: [facility];  Service: Orthopedics;  Laterality: Left;   VASECTOMY     Patient Active Problem List   Diagnosis Date Noted   Aortic atherosclerosis (HCC) 03/05/2023   Lumbar stenosis with neurogenic claudication 08/15/2022   Pre-operative exam 08/04/2022   Spondylolisthesis, lumbar region 11/09/2020   Impingement  syndrome, shoulder, left 06/05/2020   Well adult exam 01/03/2020   Peripheral arterial disease (HCC) 08/03/2019   Impingement syndrome, shoulder, right 01/12/2019   Lumbar spinal stenosis status post L4-L5 PLIF 06/29/2018   Trochanteric bursitis, right hip 07/08/2017   Primary osteoarthritis of first carpometacarpal joint of left hand 03/18/2017   Chronic headaches 11/21/2016   Anxiety 11/21/2016   Male hypogonadism 11/21/2016   Insomnia 09/27/2016   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 07/08/2016   Routine general medical examination at a health care facility 11/26/2014   Primary osteoarthritis of both hands 11/25/2014   Allergic rhinitis 12/23/2013   Fibromyalgia    Chronic pain syndrome    Depression    GERD (gastroesophageal reflux disease)    OSA (obstructive sleep apnea)    Osteoarthritis 08/10/2012   Polyarthralgia 10/03/2010    PCP: Everrett Coombe, DO   REFERRING PROVIDER: Monica Becton   REFERRING DIAG: V42.59 (ICD-10-CM) - Spondylolisthesis, lumbar region   Rationale for Evaluation and Treatment: Rehabilitation  THERAPY DIAG:  Chronic bilateral low back pain with bilateral sciatica  Muscle weakness (generalized)  ONSET DATE: chronic ongoing since August 2024  SUBJECTIVE:  SUBJECTIVE STATEMENT: Patient had multiple back surgeries, last one on 08/15/2022.  He had another epidural on Monday.   Felt a little better the first day and then worse again.   Back pain gets pretty bad, mostly across low back then back of Left hip down to L knee, worst there, especially if cough or sneeze, causes horrible pain to radiate down, that's why I figure its from my back.   Told nothing on CT referred to pain management or to live with pain, pain management did  the shot in back which did not help.   Used to walk 3 miles/day but can't do that now.    PERTINENT HISTORY:  Fusion L2-3, L3-4; Chronic pain syndrome, fibromyalgia, PAD, TMJ, OA, polyarthralgia  PAIN:  Are you having pain? Yes: NPRS scale: 3/10 up to 10/10 at worst Pain location: low back radiating down LLE to knee Pain description: sharp, dull if just sitting Aggravating factors: coughing, sneezing, standing, walking Relieving factors: sitting down, leaning on something  PRECAUTIONS: Back  RED FLAGS: None   WEIGHT BEARING RESTRICTIONS: No  FALLS:  Has patient fallen in last 6 months? No  LIVING ENVIRONMENT: Lives with: lives with their spouse Lives in: House/apartment Stairs: Yes: Internal: 2 steps; on right going up and External: 3 steps; on right going up Has following equipment at home: Single point cane and Walker - 2 wheeled  OCCUPATION: unable  PLOF: Independent  PATIENT GOALS: decrease pain  NEXT MD VISIT: none scheduled  OBJECTIVE:   DIAGNOSTIC FINDINGS:  02/20/23 CT Lumbar spine IMPRESSION: 1. Interval extension of the lumbar fusion now includes L2-3 and L3-4. The central canal and foramina are decompressed at these levels. 2. Progressive moderate central canal stenosis at L1-2, left greater than right, secondary to progressive retrolisthesis and leftward disc protrusion. 3. Progressive moderate foraminal stenosis bilaterally at L1-2, left greater than right. 4. Dynamic retrolisthesis at L1-2, exaggerated in extension. No movement was present at this level previously. 5. Leftward disc protrusion at L5-S1 with extrinsic compression on the thecal sac on the left and prominent epidural fat. Combination results in moderate left subarticular stenosis, likely impacting the traversing left S1 nerve roots. This corresponds to the patient's posterior left lower extremity symptoms. MRI lumbar spine without and with contrast may be useful to better delineate fat  versus disc at this level. 6. Foraminal narrowing bilaterally at L5-S1, left greater than right.  PATIENT SURVEYS:  Modified Oswestry 31/50   COGNITION: Overall cognitive status: Within functional limits for tasks assessed     SENSATION: Not tested  MUSCLE LENGTH: Hamstrings: Right 45 deg; Left 90 deg  POSTURE: rounded shoulders  PALPATION: No tenderness with palpation, but noted atrophy in L glutes  LUMBAR ROM:   AROM eval  Flexion Limited 75%  Extension Limited 50%  Right lateral flexion Increased pain on L immediatedly  Left lateral flexion Limited 50%  Right rotation Limited 50%  Left rotation Limited 50%   (Blank rows = not tested)  LOWER EXTREMITY ROM:     Tightness in R hip compared to Left for internal/external rotation.   LOWER EXTREMITY MMT:    MMT Right eval Left eval  Hip flexion 5 5  Hip extension    Hip abduction 5 5  Hip adduction 5 5  Knee flexion 5 5  Knee extension  5  Ankle dorsiflexion    Ankle plantarflexion     (Blank rows = not tested)     SPECIAL TESTS Lumbar Radiculopathy and Discogenic:  Slump (SN 83, -LR 0.32): R: Negative L: Negative SLR (SN 92, -LR 0.29): L: Positive R:  Negative Crossed SLR (SP 90): R: Negative L: Negative  Hip: FABER (SN 81): R: Negative L: Negative FADIR (SN 94): L: Positive for tightness R: Negative Hip scour (SN 50): R: Negative L: Negative   SIJ:  Thigh Thrust (SN 88, -LR 0.18) : R: Negative L: Negative  GAIT: Distance walked: 9' Assistive device utilized: None Level of assistance: Complete Independence Comments: no significant deviations noted   TODAY'S TREATMENT:                                                                                                                              DATE:   03/24/23 EVAL Self Care - Pain Neuroscience Education.  Patient educated on the concept of the nervous system as the bodies alarm system, and the role of nociception to warn the body of danger.   Peripheral nerve sensitization, hyperalgesia and allodynia were explained using metaphors to promote deep learning.  Patient educated on the concept of neuroplasticity and how factors such as temperature, stress, movement, immunity and blood flow affect pain via channel expression.  Objective of PT is to decrease sensitization to allow more movement without bodies alarm system going off.      PATIENT EDUCATION:  Education details: see self care Person educated: Patient Education method: Explanation Education comprehension: verbalized understanding  HOME EXERCISE PROGRAM: TBD  ASSESSMENT:  CLINICAL IMPRESSION: ERMIAS TOMEO  is a pleasant 63 year-old male referred for chronic low back pain after 2 back surgeries.  He still reports radicular symptoms on L with positive prone knee bend and noted atrophy in L glutes.  Also reported increased pain in prone positioning, with lumbar fusion L2-4 he is not a good candidate for extension based exercises.  Due to history of back surgery discussed that TrDN would increase risk of infection in his low back and in contraindicated, while he has had PT in past for general strengthening without improvement, he still would benefit from PT with goal of improving activity tolerance and try to centralize current symptoms as does demonstrate symptoms consistent with L L5-S1 radiculopathy along with peripheral nerve sensitization.  Titan Karner Wimbush presents with deficits in strength, mobility, range of motion, and pain. Patient will benefit from skilled PT services to address deficits and return to pain-free function at home and work.   OBJECTIVE IMPAIRMENTS: decreased activity tolerance, decreased endurance, decreased mobility, difficulty walking, decreased ROM, decreased strength, increased fascial restrictions, impaired perceived functional ability, increased muscle spasms, impaired flexibility, and pain.   ACTIVITY LIMITATIONS: carrying, lifting, bending, standing,  sleeping, transfers, bed mobility, and locomotion level  PARTICIPATION LIMITATIONS: meal prep, cleaning, laundry, driving, shopping, community activity, and occupation  PERSONAL FACTORS: Age, Fitness, Time since onset of injury/illness/exacerbation, and 3+ comorbidities: 2 previous back surgeries, Chronic pain syndrome, fibromyalgia, PAD, TMJ, OA, polyarthralgia  are also affecting patient's functional outcome.  REHAB POTENTIAL: Fair due to lack of improvement with previous PT  CLINICAL DECISION MAKING: Evolving/moderate complexity  EVALUATION COMPLEXITY: Moderate   GOALS: Goals reviewed with patient? Yes  SHORT TERM GOALS: Target date: 04/07/2023   Patient will be independent with initial HEP.  Baseline:  Goal status: INITIAL   LONG TERM GOALS: Target date: 05/05/2023   Patient will be independent with advanced/ongoing HEP to improve outcomes and carryover.  Baseline:  Goal status: INITIAL  2.  Patient will report 75% improvement in low back pain to improve QOL.  Baseline:  Goal status: INITIAL  3.  Patient will demonstrate full pain free lumbar ROM to perform ADLs.   Baseline: see objectvie Goal status: INITIAL  4.  Patient will report at least 6 points improvement on modified Oswestry to demonstrate improved functional ability.  Baseline: 31/50 Goal status: INITIAL   5.  Patient will tolerate 20 min of walking to exercise. Baseline: unable Goal status: INITIAL  PLAN:  PT FREQUENCY: 2x/week  PT DURATION: 6 weeks  PLANNED INTERVENTIONS: 97110-Therapeutic exercises, 97530- Therapeutic activity, [id number]- Neuromuscular re-education, 97535- Self Care, [id number]- Manual therapy, [phone]- Gait training, [phone]- Electrical stimulation (unattended), [phone]- Electrical stimulation (manual), [id number]- Ultrasound, [id number]- Traction (mechanical), Patient/Family education, Balance training, Stair training, Taping, Dry Needling, Joint mobilization, Joint manipulation, Spinal manipulation, Spinal  mobilization, Cryotherapy, and Moist heat.  PLAN FOR NEXT SESSION: start with core strengthening, standing exercises, PNE education.    Jena Gauss, PT 03/24/2023, 5:26 PM   PHYSICAL THERAPY DISCHARGE SUMMARY  Visits from Start of Care: 1 eval only  Current functional level related to goals / functional outcomes: See above   Remaining deficits: See above   Education / Equipment:   Plan: Patient agrees to discharge.  Patient decided against therapy at this time.    Jena Gauss, PT  04/10/2023 5:24 PM

## 2023-03-31 ENCOUNTER — Ambulatory Visit: Admitting: Physical Therapy

## 2023-04-11 ENCOUNTER — Encounter (INDEPENDENT_AMBULATORY_CARE_PROVIDER_SITE_OTHER): Payer: Self-pay | Admitting: Sports Medicine

## 2023-04-11 DIAGNOSIS — M48062 Spinal stenosis, lumbar region with neurogenic claudication: Secondary | ICD-10-CM | POA: Diagnosis not present

## 2023-04-11 NOTE — Telephone Encounter (Signed)

## 2023-06-17 ENCOUNTER — Other Ambulatory Visit (INDEPENDENT_AMBULATORY_CARE_PROVIDER_SITE_OTHER)

## 2023-06-17 ENCOUNTER — Ambulatory Visit: Admitting: Sports Medicine

## 2023-06-17 DIAGNOSIS — M48062 Spinal stenosis, lumbar region with neurogenic claudication: Secondary | ICD-10-CM | POA: Diagnosis not present

## 2023-06-17 DIAGNOSIS — M180 Bilateral primary osteoarthritis of first carpometacarpal joints: Secondary | ICD-10-CM | POA: Diagnosis not present

## 2023-06-17 DIAGNOSIS — M1811 Unilateral primary osteoarthritis of first carpometacarpal joint, right hand: Secondary | ICD-10-CM | POA: Diagnosis not present

## 2023-06-17 DIAGNOSIS — M1812 Unilateral primary osteoarthritis of first carpometacarpal joint, left hand: Secondary | ICD-10-CM

## 2023-06-17 MED ORDER — PREGABALIN 50 MG PO CAPS: ORAL_CAPSULE | ORAL | 3 refills | Status: AC

## 2023-06-17 MED ORDER — TRIAMCINOLONE ACETONIDE 40 MG/ML IJ SUSP
40.0000 mg | Freq: Once | INTRAMUSCULAR | Status: AC
  Administered 2023-06-17: 40 mg via INTRAMUSCULAR

## 2023-06-17 NOTE — Progress Notes (Signed)
    Procedures performed today:    Procedure: Real-time Ultrasound Guided injection of the right first [facility] Device: Samsung HS60  Verbal informed consent obtained.  Time-out conducted.  Noted no overlying erythema, induration, or other signs of local infection.  Skin prepped in a sterile fashion.  Local anesthesia: Topical Ethyl chloride.  With sterile technique and under real time ultrasound guidance: Arthritic joint noted, 0.5 cc lidocaine , 0.5 cc kenalog  40 injected easily. Completed without difficulty  Advised to call if fevers/chills, erythema, induration, drainage, or persistent bleeding.  Images permanently stored and available for review in PACS.  Impression: Technically successful ultrasound guided injection.  Independent interpretation of notes and tests performed by another provider:   None.  Brief History, Exam, Impression, and Recommendations:    Primary osteoarthritis of first carpometacarpal joints, bilateral Increasing pain right thumb basal joint, known left thumb basal joint arthritis last injected May 2022, we did a right thumb basal joint injection today, return to see me as needed.  Lumbar spinal stenosis status post L2-L5 PLIF Multilevel lumbar fusion, he does have some spinal stenosis above the level of the fusion. Continued cramping lower extremities particularly at night. He did discontinue his Lyrica , he does not know why. He was also looking for a second opinion, we did provide the information for Dr. Jackee Marus. I will restart his Lyrica , I think we need to restart a bit of a lower dose, we will start 50 mg with an up titration. Follow-up with me can be as needed.  I spent 40 minutes of total time managing this patient today, this includes chart review, face to face, and non-face to face time.  The family had multiple questions about dental procedures in the past, CBD treatments, etc., all questions were answered.  This was separate from the time spent  performing the injection.   ____________________________________________ Joselyn Nicely. Sandy Crumb, M.D., ABFM., CAQSM., AME. Primary Care and Sports Medicine [facility]  Adjunct Professor of [facility] Medicine  [employer]  Restaurant manager, fast food

## 2023-06-17 NOTE — Addendum Note (Signed)
 Addended by: Montgomery Apgar on: 06/17/2023 02:15 PM   Modules accepted: Orders

## 2023-06-17 NOTE — Assessment & Plan Note (Signed)
 Increasing pain right thumb basal joint, known left thumb basal joint arthritis last injected May 2022, we did a right thumb basal joint injection today, return to see me as needed.

## 2023-06-17 NOTE — Assessment & Plan Note (Signed)
 Multilevel lumbar fusion, he does have some spinal stenosis above the level of the fusion. Continued cramping lower extremities particularly at night. He did discontinue his Lyrica , he does not know why. He was also looking for a second opinion, we did provide the information for Dr. Jackee Marus. I will restart his Lyrica , I think we need to restart a bit of a lower dose, we will start 50 mg with an up titration. Follow-up with me can be as needed.

## 2023-08-04 ENCOUNTER — Other Ambulatory Visit: Payer: Self-pay | Admitting: Family Medicine

## 2023-08-04 DIAGNOSIS — R252 Cramp and spasm: Secondary | ICD-10-CM

## 2023-08-04 DIAGNOSIS — I739 Peripheral vascular disease, unspecified: Secondary | ICD-10-CM

## 2023-08-04 NOTE — Telephone Encounter (Signed)
 Copied from CRM [id number]. Topic: Clinical - Medication Refill >> Aug 04, 2023  2:18 PM Shamecia H wrote: Medication: rosuvastatin  (CRESTOR ) 20 MG tablet  Has the patient contacted their pharmacy? Yes (Agent: If no, request that the patient contact the pharmacy for the refill. If patient does not wish to contact the pharmacy document the reason why and proceed with request.) (Agent: If yes, when and what did the pharmacy advise?)  This is the patient's preferred pharmacy:  [id number] - [place], [place] - [address] [place] [address] Phone: [phone] Fax: [phone]  Is this the correct pharmacy for this prescription? Yes If no, delete pharmacy and type the correct one.   Has the prescription been filled recently? No  Is the patient out of the medication? No  Has the patient been seen for an appointment in the last year OR does the patient have an upcoming appointment? Yes  Can we respond through MyChart? Yes  Agent: Please be advised that Rx refills may take up to 3 business days. We ask that you follow-up with your pharmacy.

## 2023-08-06 ENCOUNTER — Telehealth: Payer: Self-pay

## 2023-08-06 NOTE — Telephone Encounter (Signed)
 Spoke with pharmacy and was told the prescription is ready for pickup.  Called patient and informed that he can pick up prescription today.

## 2023-08-06 NOTE — Telephone Encounter (Signed)
 Copied from CRM #[id number]. Topic: Clinical - Prescription Issue >> Aug 06, 2023  1:02 PM Suzette B wrote: Reason for CRM: rosuvastatin  (CRESTOR ) 20 MG tablet: Patient called from [phone] (M) advising the clinic that the pharmacy stated they had not yet received the requested for this refill. I advised the patient that the refill had been sent in and receipt by pharmacy confirmed. Patient stated they advised him he would not be able to get a refill until next month, patient stated he only 14 pills left and didn't want to run out. Advised patient to call pharmacy and check status of refill.

## 2023-09-02 ENCOUNTER — Other Ambulatory Visit: Payer: Self-pay | Admitting: Family Medicine

## 2023-09-02 DIAGNOSIS — K219 Gastro-esophageal reflux disease without esophagitis: Secondary | ICD-10-CM

## 2023-09-16 ENCOUNTER — Encounter: Payer: Self-pay | Admitting: Sports Medicine

## 2023-09-29 ENCOUNTER — Other Ambulatory Visit: Payer: Self-pay | Admitting: Family Medicine

## 2023-09-29 DIAGNOSIS — G729 Myopathy, unspecified: Secondary | ICD-10-CM

## 2023-09-29 NOTE — Telephone Encounter (Unsigned)
 Copied from CRM [id number]. Topic: Clinical - Medication Refill >> Sep 29, 2023  4:00 PM Brittney F wrote: Patient's wife is calling in to have the patient's prescription refilled. Patient is almost out of his prescription.   Callback Number: [phone]   Medication: etodolac  (LODINE  XL) 600 MG 24 hr tablet    Has the patient contacted their pharmacy? No    This is the patient's preferred pharmacy:  [id number] - [place], [place] - [address]  [address]  [place] [address]  Phone: [phone] Fax: [phone]    Is this the correct pharmacy for this prescription? Yes    Has the prescription been filled recently? No    Is the patient out of the medication? Yes    Has the patient been seen for an appointment in the last year OR does the patient have an upcoming appointment? Yes    Can we respond through MyChart? Yes    Agent: Please be advised that Rx refills may take up to 3 business days. We ask that you follow-up with your pharmacy.

## 2023-09-30 MED ORDER — ETODOLAC ER 600 MG PO TB24: 600.0000 mg | ORAL_TABLET | Freq: Two times a day (BID) | ORAL | 3 refills | Status: AC

## 2023-10-31 ENCOUNTER — Ambulatory Visit: Payer: Self-pay

## 2023-10-31 NOTE — Telephone Encounter (Signed)
 FYI Only or Action Required?: FYI only for provider.  Patient was last seen in primary care on 06/17/2023 by Curtis Debby PARAS, MD.  Called Nurse Triage reporting Ankle Pain.  Symptoms began today.  Interventions attempted: Nothing.  Symptoms are: unchanged.  Triage Disposition: Information or Advice Only Call  Patient/caregiver understands and will follow disposition?: Unsure           Copied from CRM [id number]. Topic: Clinical - Red Word Triage >> Oct 31, 2023  9:00 AM Montie POUR wrote: Red Word that prompted transfer to Nurse Triage:  Arland, spouse, is calling to make an appointment because his left ankle is very painful. Pain level is a 9 to 10. This has been going on since yesterday. He does not know how it happened. Reason for Disposition  Triager needs further essential information from caller in order to complete triage    No access with PCP so triager offered UC, wife states that UC won't help us  and we do not think it's ortho related. Triager advised wife to have pt call back with additional information for appropriate guidance.  Answer Assessment - Initial Assessment Questions 1. ONSET: When did the pain start?      yesterday 2. LOCATION: Where is the pain located?      L ankle 3. PAIN: How bad is the pain?  (Scale 1-10; or mild, moderate, severe)     Moderate-severe 4. WORK OR EXERCISE: Has there been any recent work or exercise that involved this part of the body?      denies 5. CAUSE: What do you think is causing the ankle pain?     unknown 6. OTHER SYMPTOMS: Do you have any other symptoms? (e.g., calf pain, rash, fever, swelling)     Denies. Triage limited d/t wife calling on pt behalf and wife currently at eye doctor receiving exam during call. 7. PREGNANCY: Is there any chance you are pregnant? When was your last menstrual period?     N/a  Protocols used: Ankle Pain-A-AH, Information Only Call - No Triage-A-AH

## 2023-10-31 NOTE — Telephone Encounter (Signed)
 Called to offer patient an appointment to be seen for his ankle. Pt states he will call us  back to schedule when he leaves his current appointment.

## 2023-11-05 ENCOUNTER — Encounter: Payer: Self-pay | Admitting: Family Medicine

## 2023-11-05 ENCOUNTER — Ambulatory Visit: Admitting: Family Medicine

## 2023-11-05 VITALS — BP 111/67 | HR 69 | Ht 70.0 in | Wt 201.0 lb

## 2023-11-05 DIAGNOSIS — G894 Chronic pain syndrome: Secondary | ICD-10-CM | POA: Diagnosis not present

## 2023-11-05 DIAGNOSIS — M2550 Pain in unspecified joint: Secondary | ICD-10-CM | POA: Diagnosis not present

## 2023-11-05 DIAGNOSIS — Z23 Encounter for immunization: Secondary | ICD-10-CM | POA: Diagnosis not present

## 2023-11-05 MED ORDER — TADALAFIL 20 MG PO TABS: 10.0000 mg | ORAL_TABLET | ORAL | 11 refills | Status: DC | PRN

## 2023-11-05 NOTE — Assessment & Plan Note (Signed)
 Orders Placed This Encounter  Procedures   Pneumococcal conjugate vaccine 20-valent (Prevnar 20)   ANA,IFA RA Diag Pnl w/rflx Tit/Patn   CK (Creatine Kinase)   Uric acid   CYCLIC CITRUL PEPTIDE ANTIBODY, IGG/IGA  Checking autoimmune panel.

## 2023-11-05 NOTE — Assessment & Plan Note (Signed)
 Having increased pain in his cast.  Does not seem to be vascular claudication, question neurogenic claudication.  He did recently decrease his Lyrica  as well which seems to correlate with his increased pain.  He will increase this back to previous strength.

## 2023-11-05 NOTE — Progress Notes (Signed)
 Benjamin Warren - 63 y.o. male MRN [id number]  Date of birth: 06/16/1960  Subjective Chief Complaint  Patient presents with   Leg Pain    HPI Benjamin Warren is a 63 y.o. male here today with complaint of bilateral calf pain.  Worse on the left compared to the right.  He does have history of PAD however most recent ABI did show improvement.  He has been followed by vascular surgery.  He is no longer on cilostazol .  He also has history of lumbar spine disease.  This pain has worsened with walking, even short distances.  Has had previous nerve conduction studies as well as a muscle biopsy which were normal.  He denies any swelling of the legs.  He did recently cut back on his Lyrica  which coincides with his increased pain.  ROS:  A comprehensive ROS was completed and negative except as noted per HPI  No Known Allergies  Past Medical History:  Diagnosis Date   Anxiety 11/21/2016   Arthritis    Chronic pain syndrome    Crepitus of left TMJ on opening of jaw 11/21/2016   Depression    Fever blister 11/21/2016   Fibromyalgia    GERD (gastroesophageal reflux disease)    Headache    Left rotator cuff tear 10/09/2012   Male hypogonadism 11/21/2016   Peripheral vascular disease    PAD. Pt takes pletal    PONV (postoperative nausea and vomiting)    Sleep apnea    had test-no dr told him he needed cpap   TMJ syndrome    Vitamin D  deficiency 11/21/2016    Past Surgical History:  Procedure Laterality Date   COLONOSCOPY     ELBOW ARTHROPLASTY  1996   right   HARDWARE REMOVAL  1997   rt wrist   ORIF WRIST FRACTURE  1997   right   ROTATOR CUFF REPAIR Right    January 2021   SHOULDER ARTHROSCOPY  1996   right   SHOULDER ARTHROSCOPY WITH ROTATOR CUFF REPAIR AND SUBACROMIAL DECOMPRESSION Left 10/09/2012   Procedure: LEFT SHOULDER ARTHROSCOPY WITH SUBACROMIAL DECOMPRESSION, DISTAL CLAVICLE EXCISION DEBRIDEMENT AND ROTATOR CUFF REPAIR;  Surgeon: Fonda SHAUNNA Olmsted, MD;  Location: [facility];  Service: Orthopedics;  Laterality: Left;   VASECTOMY      Social History   Socioeconomic History   Marital status: Married    Spouse name: Not on file   Number of children: Not on file   Years of education: Not on file   Highest education level: Not on file  Occupational History   Not on file  Tobacco Use   Smoking status: Former    Current packs/day: 0.00    Average packs/day: 0.5 packs/day for 6.0 years (3.0 ttl pk-yrs)    Types: Cigarettes    Start date: 10/02/1976    Quit date: 10/03/1982    Years since quitting: 41.1   Smokeless tobacco: Never  Vaping Use   Vaping status: Never Used  Substance and Sexual Activity   Alcohol use: Yes    Comment: rarely   Drug use: Never   Sexual activity: Yes    Birth control/protection: Post-menopausal  Other Topics Concern   Not on file  Social History Narrative   Married with daughter and son.   Social Drivers of Health   Financial Resource Strain: Patient Declined (10/22/2022)   Received from [facility]   Overall Financial Resource Strain (CARDIA)    Difficulty of Paying Living Expenses: Patient declined  Food Insecurity: Patient Declined (10/22/2022)   Received from [facility]   Hunger Vital Sign    Within the past 12 months, you worried that your food would run out before you got the money to buy more.: Patient declined    Within the past 12 months, the food you bought just didn't last and you didn't have money to get more.: Patient declined  Transportation Needs: Patient Declined (10/22/2022)   Received from [facility] - Transportation    Lack of Transportation (Medical): Patient declined    Lack of Transportation (Non-Medical): Patient declined  Physical Activity: Not on file  Stress: Not on file  Social Connections: Unknown (05/25/2021)   Received from [facility]   Social Network    Social Network: Not on file    Family History  Problem Relation Age of Onset   Alcohol abuse  Mother    Hypertension Mother    Alcohol abuse Other        FAMILY HISTORY   Arthritis Other        FAMILY HISTORY   Stroke Other    Pancreatic cancer Brother    Liver cancer Brother     Health Maintenance  Topic Date Due   Influenza Vaccine  04/13/2024 (Originally 08/15/2023)   COVID-19 Vaccine (6 - 2025-26 season) 11/20/2024 (Originally 09/15/2023)   Colonoscopy  02/08/2025   DTaP/Tdap/Td (4 - Td or Tdap) 06/23/2026   Pneumococcal Vaccine: 50+ Years  Completed   Hepatitis C Screening  Completed   HIV Screening  Completed   Zoster Vaccines- Shingrix  Completed   Hepatitis B Vaccines 19-59 Average Risk  Aged Out   HPV VACCINES  Aged Out   Meningococcal B Vaccine  Aged Out     ----------------------------------------------------------------------------------------------------------------------------------------------------------------------------------------------------------------- Physical Exam BP 111/67 (BP Location: Left Arm, Patient Position: Sitting, Cuff Size: Normal)   Pulse 69   Ht 5' 10 (1.778 m)   Wt 201 lb (91.2 kg)   SpO2 94%   BMI 28.84 kg/m   Physical Exam Constitutional:      Appearance: Normal appearance.  Eyes:     General: No scleral icterus. Cardiovascular:     Rate and Rhythm: Normal rate and regular rhythm.  Pulmonary:     Effort: Pulmonary effort is normal.     Breath sounds: Normal breath sounds.  Musculoskeletal:     Comments: Tenderness to palpation along bilateral calves, worse on the left. Dorsalis pedis and posterior tibial pulses 1+ bilaterally.  Neurological:     Mental Status: He is alert.     ------------------------------------------------------------------------------------------------------------------------------------------------------------------------------------------------------------------- Assessment and Plan  Polyarthralgia Orders Placed This Encounter  Procedures   Pneumococcal conjugate vaccine 20-valent (Prevnar  20)   ANA,IFA RA Diag Pnl w/rflx Tit/Patn   CK (Creatine Kinase)   Uric acid   CYCLIC CITRUL PEPTIDE ANTIBODY, IGG/IGA  Checking autoimmune panel.    Chronic pain syndrome Having increased pain in his cast.  Does not seem to be vascular claudication, question neurogenic claudication.  He did recently decrease his Lyrica  as well which seems to correlate with his increased pain.  He will increase this back to previous strength.   Meds ordered this encounter  Medications   tadalafil (CIALIS) 20 MG tablet    Sig: Take 0.5-1 tablets (10-20 mg total) by mouth every other day as needed for erectile dysfunction.    Dispense:  10 tablet    Refill:  11    No follow-ups on file.

## 2023-11-06 ENCOUNTER — Ambulatory Visit: Payer: Self-pay | Admitting: Family Medicine

## 2023-11-08 LAB — ANA,IFA RA DIAG PNL W/RFLX TIT/PATN
ANA Titer 1: NEGATIVE
Cyclic Citrullin Peptide Ab: 10 U (ref 0–19)
Rheumatoid fact SerPl-aCnc: 73.3 [IU]/mL — ABNORMAL HIGH (ref <14.0)

## 2023-11-08 LAB — CK: Total CK: 225 U/L (ref 41–331)

## 2023-11-08 LAB — URIC ACID: Uric Acid: 2.8 mg/dL — ABNORMAL LOW (ref 3.8–8.4)

## 2023-11-20 ENCOUNTER — Other Ambulatory Visit: Payer: Self-pay | Admitting: *Deleted

## 2023-11-20 DIAGNOSIS — I739 Peripheral vascular disease, unspecified: Secondary | ICD-10-CM

## 2023-11-21 ENCOUNTER — Ambulatory Visit: Admitting: Vascular Surgery

## 2023-11-21 ENCOUNTER — Encounter: Payer: Self-pay | Admitting: Vascular Surgery

## 2023-11-21 ENCOUNTER — Ambulatory Visit (HOSPITAL_COMMUNITY)
Admission: RE | Admit: 2023-11-21 | Discharge: 2023-11-21 | Disposition: A | Discharge status: Home / Self Care | Source: Ambulatory Visit | Attending: Vascular Surgery | Admitting: Vascular Surgery

## 2023-11-21 VITALS — BP 140/89 | HR 75 | Temp 98.2°F | Resp 18 | Ht 70.0 in | Wt 203.3 lb

## 2023-11-21 DIAGNOSIS — I739 Peripheral vascular disease, unspecified: Secondary | ICD-10-CM | POA: Insufficient documentation

## 2023-11-21 LAB — VAS US ABI WITH/WO TBI
Left ABI: 0.63
Right ABI: 0.94

## 2023-11-21 NOTE — Progress Notes (Signed)
 Patient ID: Benjamin Warren, male   DOB: 03/14/1960, 63 y.o.   MRN: [id number]  Reason for Consult: Follow-up   Referred by Alvia Bring, DO  Subjective:     HPI Benjamin Warren is a 63 y.o. male presenting for follow-up of PAD.  He was last seen in October 2024 and was noted to have a mild depression on the right with an ABI 0.85 and toe pressure of 86.  The left was normal.  At that time he was having hip pain that was shooting down his leg which was attributed to his back as he has had multiple back surgeries. Today he reports he is having left leg claudication that started over the last 1 to 2 months.  He can make it approximately 4 laps around his cul-de-sac before he has to stop.  He denies nocturnal rest pain or slow/nonhealing wounds.  He is not currently on a baby aspirin but is on Crestor .  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 Pt takes pletal    PONV (postoperative nausea and vomiting)    Sleep apnea    had test-no dr told him he needed cpap   TMJ syndrome    Vitamin D  deficiency 11/21/2016   Family History  Problem Relation Age of Onset   Alcohol abuse Mother    Hypertension Mother    Alcohol abuse Other        FAMILY HISTORY   Arthritis Other        FAMILY HISTORY   Stroke Other    Pancreatic cancer Brother    Liver cancer Brother    Past Surgical History:  Procedure Laterality Date   COLONOSCOPY     ELBOW ARTHROPLASTY  1996   right   HARDWARE REMOVAL  1997   rt wrist   ORIF WRIST FRACTURE  1997   right   ROTATOR CUFF REPAIR Right    January 2021   SHOULDER ARTHROSCOPY  1996   right   SHOULDER ARTHROSCOPY WITH ROTATOR CUFF  REPAIR AND SUBACROMIAL DECOMPRESSION Left 10/09/2012   Procedure: LEFT SHOULDER ARTHROSCOPY WITH SUBACROMIAL DECOMPRESSION, DISTAL CLAVICLE EXCISION DEBRIDEMENT AND ROTATOR CUFF REPAIR;  Surgeon: Fonda SHAUNNA Olmsted, MD;  Location: [facility];  Service: Orthopedics;  Laterality: Left;   VASECTOMY      Short Social History:  Social History   Tobacco Use   Smoking status: Former    Current packs/day: 0.00    Average packs/day: 0.5 packs/day for 6.0 years (3.0 ttl pk-yrs)    Types: Cigarettes    Start date: 10/02/1976    Quit date: 10/03/1982    Years since quitting: 41.1   Smokeless tobacco: Never  Substance Use Topics   Alcohol use: Yes    Comment: rarely    No Known Allergies  Current Outpatient Medications  Medication Sig Dispense Refill   cholecalciferol (VITAMIN D3) 25 MCG (1000 UNIT) tablet Take 1,000 Units by mouth daily.     diazepam  (VALIUM ) 10 MG tablet Take 2 tablets (20 mg total) by mouth daily. 30 tablet 0   etodolac  (LODINE  XL) 600  MG 24 hr tablet Take 1 tablet (600 mg total) by mouth 2 (two) times daily. 180 tablet 3   omeprazole  (PRILOSEC) 20 MG capsule TAKE 1 CAPSULE BY MOUTH EVERY DAY 90 capsule 1   pregabalin  (LYRICA ) 50 MG capsule 1 capsule p.o. nightly for a week then twice daily for a week then 3 times daily 90 capsule 3   rosuvastatin  (CRESTOR ) 20 MG tablet TAKE 1 TABLET BY MOUTH EVERY DAY 90 tablet 1   tadalafil (CIALIS) 20 MG tablet Take 0.5-1 tablets (10-20 mg total) by mouth every other day as needed for erectile dysfunction. 10 tablet 11   valACYclovir  (VALTREX ) 1000 MG tablet Take 1 tablet (1,000 mg total) by mouth 2 (two) times daily. 20 tablet 1   No current facility-administered medications for this visit.    REVIEW OF SYSTEMS  All other systems were reviewed and are negative     Objective:  Objective   Vitals:   11/21/23 1502  BP: (!) 140/89  Pulse: 75  Resp: 18  Temp: 98.2 F (36.8 C)  TempSrc: Temporal  SpO2: 97%  Weight:  203 lb 4.8 oz (92.2 kg)  Height: 5' 10 (1.778 m)   Body mass index is 29.17 kg/m.  Physical Exam General: no acute distress Cardiac: hemodynamically stable Extremities: no edema, cyanosis or wounds Vascular:   Right: Palpable DP  Left: 1+ DP  Data: ABI ---------+------------------+-----+-----------+--------+  Right   Rt Pressure (mmHg)IndexWaveform   Comment   +---------+------------------+-----+-----------+--------+  Brachial 149                                         +---------+------------------+-----+-----------+--------+  PTA     129               0.84 monophasic brisk     +---------+------------------+-----+-----------+--------+  DP      144               0.94 multiphasic          +---------+------------------+-----+-----------+--------+  Great Toe93                0.61 Normal               +---------+------------------+-----+-----------+--------+   +---------+------------------+-----+----------+----------+  Left    Lt Pressure (mmHg)IndexWaveform  Comment     +---------+------------------+-----+----------+----------+  Brachial 153                                          +---------+------------------+-----+----------+----------+  PTA     93                0.61 monophasicretrograde  +---------+------------------+-----+----------+----------+  DP      97                0.63 monophasic            +---------+------------------+-----+----------+----------+  Great Toe69                0.45 Abnormal              +---------+------------------+-----+----------+----------+   Previously: Right 0.85 toe pressure 86, left 1.1 toe pressure 111     Assessment/Plan:   Benjamin Warren is a 63 y.o. male with PAD and claudication.  I explained that I believe his left ABI to be erroneous  today as he does have a 1+ palpable DP and it was essentially normal about a year ago.  He also does not have typical risk factors, he is a  non-smoker, he is not diabetic, he has got reasonable control blood pressure and is on Crestor  for HLD. Regardless we will plan to proceed as a typical claudicant and discussed best medical therapy which she is already compliant with and continuing with his walking program. Will plan to follow-up in 3 months with a repeat ABI   Norman GORMAN Serve MD [facility]

## 2023-11-26 ENCOUNTER — Other Ambulatory Visit: Payer: Self-pay | Admitting: Vascular Surgery

## 2023-11-26 MED ORDER — CILOSTAZOL 100 MG PO TABS: 100.0000 mg | ORAL_TABLET | Freq: Two times a day (BID) | ORAL | 11 refills | Status: AC

## 2023-11-27 ENCOUNTER — Other Ambulatory Visit: Payer: Self-pay | Admitting: *Deleted

## 2023-11-27 DIAGNOSIS — I739 Peripheral vascular disease, unspecified: Secondary | ICD-10-CM

## 2023-12-26 ENCOUNTER — Ambulatory Visit (HOSPITAL_COMMUNITY)

## 2023-12-26 ENCOUNTER — Ambulatory Visit

## 2024-01-05 ENCOUNTER — Other Ambulatory Visit: Payer: Self-pay | Admitting: Family Medicine

## 2024-01-28 ENCOUNTER — Other Ambulatory Visit: Payer: Self-pay | Admitting: Family Medicine

## 2024-01-30 ENCOUNTER — Encounter: Payer: Self-pay | Admitting: Vascular Surgery

## 2024-02-01 ENCOUNTER — Other Ambulatory Visit: Payer: Self-pay | Admitting: Family Medicine

## 2024-02-01 DIAGNOSIS — R252 Cramp and spasm: Secondary | ICD-10-CM

## 2024-02-01 DIAGNOSIS — I739 Peripheral vascular disease, unspecified: Secondary | ICD-10-CM

## 2024-02-20 ENCOUNTER — Ambulatory Visit (HOSPITAL_COMMUNITY)

## 2024-02-20 ENCOUNTER — Ambulatory Visit: Admitting: Vascular Surgery
